# Patient Record
Sex: Female | Born: 1965
Health system: Southern US, Community
[De-identification: ages and names within clinical notes are randomized; demographics above are authoritative.]

## PROBLEM LIST (undated history)

## (undated) ENCOUNTER — Ambulatory Visit (HOSPITAL_COMMUNITY): Admission: EM | Payer: BC Managed Care – PPO

## (undated) DIAGNOSIS — M069 Rheumatoid arthritis, unspecified: Secondary | ICD-10-CM

## (undated) DIAGNOSIS — F419 Anxiety disorder, unspecified: Secondary | ICD-10-CM

## (undated) DIAGNOSIS — I10 Essential (primary) hypertension: Secondary | ICD-10-CM

## (undated) DIAGNOSIS — T7840XA Allergy, unspecified, initial encounter: Secondary | ICD-10-CM

## (undated) DIAGNOSIS — K589 Irritable bowel syndrome without diarrhea: Secondary | ICD-10-CM

## (undated) DIAGNOSIS — R12 Heartburn: Secondary | ICD-10-CM

## (undated) DIAGNOSIS — K219 Gastro-esophageal reflux disease without esophagitis: Secondary | ICD-10-CM

## (undated) DIAGNOSIS — D649 Anemia, unspecified: Secondary | ICD-10-CM

## (undated) DIAGNOSIS — G709 Myoneural disorder, unspecified: Secondary | ICD-10-CM

## (undated) HISTORY — DX: Allergy, unspecified, initial encounter: T78.40XA

## (undated) HISTORY — DX: Anxiety disorder, unspecified: F41.9

## (undated) HISTORY — PX: ABDOMINAL HYSTERECTOMY: SHX81

## (undated) HISTORY — DX: Myoneural disorder, unspecified: G70.9

## (undated) HISTORY — DX: Gastro-esophageal reflux disease without esophagitis: K21.9

## (undated) HISTORY — PX: OTHER SURGICAL HISTORY: SHX169

## (undated) HISTORY — DX: Anemia, unspecified: D64.9

## (undated) HISTORY — DX: Irritable bowel syndrome, unspecified: K58.9

---

## 1998-05-28 ENCOUNTER — Other Ambulatory Visit: Admission: RE | Admit: 1998-05-28 | Discharge: 1998-05-28 | Payer: Self-pay | Admitting: Obstetrics & Gynecology

## 1999-09-19 ENCOUNTER — Other Ambulatory Visit: Admission: RE | Admit: 1999-09-19 | Discharge: 1999-09-19 | Payer: Self-pay | Admitting: Obstetrics & Gynecology

## 2000-10-08 ENCOUNTER — Other Ambulatory Visit: Admission: RE | Admit: 2000-10-08 | Discharge: 2000-10-08 | Payer: Self-pay | Admitting: Obstetrics & Gynecology

## 2001-11-11 ENCOUNTER — Other Ambulatory Visit: Admission: RE | Admit: 2001-11-11 | Discharge: 2001-11-11 | Payer: Self-pay | Admitting: Obstetrics & Gynecology

## 2003-01-18 ENCOUNTER — Other Ambulatory Visit: Admission: RE | Admit: 2003-01-18 | Discharge: 2003-01-18 | Payer: Self-pay | Admitting: Obstetrics & Gynecology

## 2004-02-13 ENCOUNTER — Other Ambulatory Visit: Admission: RE | Admit: 2004-02-13 | Discharge: 2004-02-13 | Payer: Self-pay | Admitting: Obstetrics & Gynecology

## 2005-03-31 ENCOUNTER — Other Ambulatory Visit: Admission: RE | Admit: 2005-03-31 | Discharge: 2005-03-31 | Payer: Self-pay | Admitting: Obstetrics & Gynecology

## 2009-09-21 ENCOUNTER — Encounter (INDEPENDENT_AMBULATORY_CARE_PROVIDER_SITE_OTHER): Payer: Self-pay | Admitting: *Deleted

## 2009-10-25 ENCOUNTER — Ambulatory Visit: Payer: Self-pay | Admitting: Internal Medicine

## 2009-10-25 DIAGNOSIS — R141 Gas pain: Secondary | ICD-10-CM | POA: Insufficient documentation

## 2009-10-25 DIAGNOSIS — K589 Irritable bowel syndrome without diarrhea: Secondary | ICD-10-CM | POA: Insufficient documentation

## 2009-10-25 DIAGNOSIS — R142 Eructation: Secondary | ICD-10-CM

## 2009-10-25 DIAGNOSIS — R143 Flatulence: Secondary | ICD-10-CM

## 2009-10-25 LAB — CONVERTED CEMR LAB
Basophils Absolute: 0 10*3/uL (ref 0.0–0.1)
CRP, High Sensitivity: 9.32 — ABNORMAL HIGH (ref 0.00–5.00)
HCT: 35.5 % — ABNORMAL LOW (ref 36.0–46.0)
Lymphs Abs: 2 10*3/uL (ref 0.7–4.0)
MCV: 90.8 fL (ref 78.0–100.0)
Monocytes Absolute: 0.5 10*3/uL (ref 0.1–1.0)
Neutrophils Relative %: 62.7 % (ref 43.0–77.0)
Platelets: 335 10*3/uL (ref 150.0–400.0)
RDW: 12.7 % (ref 11.5–14.6)
TSH: 1.34 microintl units/mL (ref 0.35–5.50)
WBC: 7.1 10*3/uL (ref 4.5–10.5)

## 2009-12-12 ENCOUNTER — Ambulatory Visit: Payer: Self-pay | Admitting: Internal Medicine

## 2010-01-25 ENCOUNTER — Encounter: Payer: Self-pay | Admitting: Internal Medicine

## 2010-07-09 NOTE — Letter (Signed)
Summary: CMA Hemoccult Letter  [facility]  [address] [place], [place] [address]   Phone: [phone]  Fax: [phone]         January 25, 2010 MRN: [id number]    [facility] [address] [place], [place]  [address]    Dear Karla Howell,     At your last visit, Dr.Perry requested that you complete the hemoccult cards given to you at your last visit. However, as of yet, we have not received them. Please follow the instructions on the inside cover and return them as soon as possible.If you have misplaced the hemoccult cards, please call me at [phone] and I will mail you new cards. Your health is very important to [place].These tests will help ensure that Dr. Marina Goodell has all the information at his disposal to make a complete diagnosis for you.  Thank you for your prompt attention to this matter.   Sincerely,    Milford Cage NCMA

## 2010-07-09 NOTE — Assessment & Plan Note (Signed)
Summary: Bloating and gas - 6 week followup   History of Present Illness Visit Type: Follow-up Visit Primary GI Howell: Karla Howell Primary Provider: Annamaria Helling, M.D. Chief Complaint: Pt states she still has gas, bloating after meals.  Pt states she has been taking Gas-x prevention before meals which has really helped the last 2 days.  History of Present Illness:   45 year old female who was evaluated Oct 25, 2009 for postprandial abdominal bloating and gas. CBC revealed mild anemia with a hemoglobin of 11.8. C-Reactive protein was mildly elevated at 9. Tissue transglutaminase antibody was normal as was TSH. Empiric trial of probiotic Align did not help. She did use GlycoLax with improvement in bowel movements and overall sense of relief. She was concerned about using this regularly though had no problems. She did not submitted Hemoccult cards. She states that she continues with gas and bloating after meals. She recently tried a Gas-X type digestive enzyme and thinks this helped some. No new problems.   GI Review of Systems    Reports belching and  bloating.      Denies abdominal pain, acid reflux, chest pain, dysphagia with liquids, dysphagia with solids, heartburn, loss of appetite, nausea, vomiting, vomiting blood, weight loss, and  weight gain.      Reports constipation.     Denies anal fissure, black tarry stools, change in bowel habit, diarrhea, diverticulosis, fecal incontinence, heme positive stool, hemorrhoids, irritable bowel syndrome, jaundice, light color stool, liver problems, rectal bleeding, and  rectal pain.    Current Medications (verified): 1)  Gas-X Prevention  Caps (Alpha-D-Galactosidase) .... One Capsule By Mouth Before Meals  Allergies (verified): No Known Drug Allergies  Past History:  Past Medical History: Reviewed history from 10/25/2009 and no changes required. Unremarkable  Past Surgical History: Reviewed history from 10/25/2009 and no changes  required. Unremarkable  Family History: Reviewed history from 10/25/2009 and no changes required. Family History of Diabetes: GM Family History of Heart Disease: GM Family History of Kidney Disease:GM No FH of Colon Cancer:  Social History: Reviewed history from 10/25/2009 and no changes required. 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  Alcohol Use - yes Daily Caffeine Use Illicit Drug Use - no  Review of Systems  The patient denies allergy/sinus, anemia, anxiety-new, arthritis/joint pain, back pain, blood in urine, breast changes/lumps, change in vision, confusion, cough, coughing up blood, depression-new, fainting, fatigue, fever, headaches-new, hearing problems, heart murmur, heart rhythm changes, itching, menstrual pain, muscle pains/cramps, night sweats, nosebleeds, pregnancy symptoms, shortness of breath, skin rash, sleeping problems, sore throat, swelling of feet/legs, swollen lymph glands, thirst - excessive , urination - excessive , urination changes/pain, urine leakage, vision changes, and voice change.    Vital Signs:  Patient profile:   45 year old female Height:      63.5 inches Weight:      159.25 pounds BMI:     27.87 Pulse rate:   80 / minute Pulse rhythm:   regular BP sitting:   128 / 80  (left arm) Cuff size:   regular  Vitals Entered By: Christie Nottingham CMA Duncan Dull) (December 12, 2009 4:07 PM)  Physical Exam  General:  Well developed, well nourished, no acute distress. Head:  Normocephalic and atraumatic. Eyes:  PERRLA, no icterus. Lungs:  Clear throughout to auscultation. Heart:  Regular rate and rhythm; no murmurs, rubs,  or bruits. Abdomen:  Soft, nontender and nondistended. No masses, hepatosplenomegaly or hernias noted. Normal bowel sounds. Pulses:  Normal pulses noted. Extremities:  no edema Neurologic:  Alert and  oriented x4;   Psych:  Alert and cooperative. Normal mood and affect.   Impression & Recommendations:  Problem # 1:  IBS  (ICD-564.1) problems with postprandial bloating and chronic constipation likely due to irritable bowel syndrome. We discussed this today.  Plan: #1. Okay to use MiraLax more liberally #2. Discussed participating in research study currently ongoing for constipation IBS. She will consider  Problem # 2:  SCREENING COLORECTAL-CANCER (ICD-V76.51) we discussed colon cancer screening. She is an appropriate candidate. Appropriate age given race.  Plan: #1. The nature of colonoscopy as well as the risks, benefits, and alternatives were reviewed in detail. She understood.[name] Kitchen She wants to check with her insurance carrier regarding potential costs. If she decides to proceed with colonoscopy at this time, she will contact the office and see a pre-visit nurse. #2. If the patient is not proceed with colonoscopy, then submit Hemoccult cards.  Problem # 3:  FLATULENCE-GAS-BLOATING (ICD-787.3) please see #1 above  Patient Instructions: 1)  Adjust Miralax as needed. 2)  Call when you are ready to schedule your colonoscopy procedure. 3)  Please schedule a follow-up appointment as needed.  4)  The medication list was reviewed and reconciled.  All changed / newly prescribed medications were explained.  A complete medication list was provided to the patient / caregiver. 5)  Copy: Dr. Annamaria Helling

## 2010-07-09 NOTE — Assessment & Plan Note (Signed)
Summary: bloating/gas--   History of Present Illness Visit Type: Initial Visit Primary GI MD: Yancey Flemings MD Primary Provider: Annamaria Helling, M.D. Chief Complaint: Bloating after meals,  gas History of Present Illness:   45 year old [place] American female with no significant medical history. She presents for self today with chief complaint of abdominal bloating. She reports at least a 5 year history of stable but persistent postprandial abdominal bloating and swelling sensation. No true pain. Problem occurs most days and is worse with constipation. She states that she moves her bowels spontaneously about every 3-4 days. After a bowel movement, either spontaneously or assisted, she feels somewhat better. She uses prune juice regularly. She describes her stools as pebble-like. No bleeding. No weight loss. No family history of colon cancer. No prior history of GI problems or GI evaluations. She has not noticed any particular foods that her most problematic. She tried multiple anti-gas agents, OTC, without relief. No nocturnal symptoms. She does mention borborygmi at night. Her gynecologist is Dr. Aldona Bar.   GI Review of Systems    Reports belching and  bloating.      Denies abdominal pain, acid reflux, chest pain, dysphagia with liquids, dysphagia with solids, heartburn, loss of appetite, nausea, vomiting, vomiting blood, weight loss, and  weight gain.      Reports constipation.     Denies anal fissure, black tarry stools, change in bowel habit, diarrhea, diverticulosis, fecal incontinence, heme positive stool, hemorrhoids, irritable bowel syndrome, jaundice, light color stool, liver problems, rectal bleeding, and  rectal pain. Preventive Screening-Counseling & Management  Alcohol-Tobacco     Smoking Status: never      Drug Use:  no.      Current Medications (verified): 1)  Seasonique 0.15-0.03 &0.01 Mg Tabs (Levonorgest-Eth Estrad 91-Day) .... 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  Alcohol Use - yes Daily Caffeine Use Illicit Drug Use - no Smoking Status:  never Drug Use:  no  Review of Systems       The patient complains of menstrual pain and swelling of feet/legs.  The patient denies allergy/sinus, anemia, anxiety-new, arthritis/joint pain, back pain, blood in urine, breast changes/lumps, change in vision, confusion, cough, coughing up blood, depression-new, fainting, fatigue, fever, headaches-new, hearing problems, heart murmur, heart rhythm changes, itching, muscle pains/cramps, night sweats, nosebleeds, pregnancy symptoms, shortness of breath, skin rash, sleeping problems, sore throat, swollen lymph glands, thirst - excessive , urination - excessive , urination changes/pain, urine leakage, vision changes, and voice change.    Vital Signs:  Patient profile:   45 year old female Height:      63.5 inches Weight:      159.38 pounds BMI:     27.89 Pulse rate:   80 / minute Pulse rhythm:   regular BP sitting:   130 / 88  (left arm) Cuff size:   regular  Vitals Entered By: June McMurray CMA Duncan Dull) (Oct 25, 2009 11:16 AM)  Physical Exam  General:  Well developed, well nourished, no acute distress. Head:  Normocephalic and atraumatic. Eyes:  PERRLA, no icterus. Ears:  Normal auditory acuity. Nose:  No deformity, discharge,  or lesions. Mouth:  No deformity or lesions, dentition normal. Neck:  Supple; no  masses or thyromegaly. Lungs:  Clear throughout to auscultation. Heart:  Regular rate and rhythm; no murmurs, rubs,  or bruits. Abdomen:  Soft, nontender and nondistended. No masses, hepatosplenomegaly or hernias noted. Normal bowel  sounds. Rectal:  deferred Msk:  Symmetrical with no gross deformities. Normal posture. Pulses:  Normal pulses noted. Extremities:  No clubbing, cyanosis, edema or deformities noted. Neurologic:  Alert and  oriented x4;   Skin:  Intact without significant lesions or rashes. Psych:  Alert and cooperative. Normal mood and affect.   Impression & Recommendations:  Problem # 1:  IBS (ICD-564.1) chronic problems with postprandial bloating associated with relative constipation. No alarm features. The history is most compatible with constipation predominant irritable bowel syndrome. I discussed this with the patient.  Plan: #1. Screening laboratories including CBC, C-reactive protein, tissue transglutaminase antibody, and thyroid stimulating hormone #2. Screening Hemoccult cards #3. Empiric trial of the probiotic align. One daily for 2 weeks. Samples given #4. GlycoLax 17 g in 8 ounces of water daily. Titrated need to achieve at least one daily bowel movement #5. Routine office followup in 6 weeks  Problem # 2:  FLATULENCE-GAS-BLOATING (ICD-787.3) as above, likely due to IBS. Workup and plan as above.  Problem # 3:  SCREENING COLORECTAL-CANCER (ICD-V76.51) screening colonoscopy at age 72 for African Americans. She turns 44 shortly. I discussed this briefly today. We will discuss this further at the time of her followup.  Other Orders: TLB-CBC Platelet - w/Differential (85025-CBCD) TLB-CRP-High Sensitivity (C-Reactive Protein) (86140-FCRP) TLB-TSH (Thyroid Stimulating Hormone) (84443-TSH) T-Tissue Transglutamase Ab IgA ([address]) [facility] Hemoccult Cards #3 (take home) (Hem cards #3)  Patient Instructions: 1)  Hemoccult cards given to patient to return in 2 weeks. 2)  Instructions given with the cards. 3)  Labs ordered for patient to have drawn today. 4)  Miralax instructions given to patient. 5)  Align samples given to patient to take 1 by mouth once daily x 2 weeks. 6)  The  medication list was reviewed and reconciled.  All changed / newly prescribed medications were explained.  A complete medication list was provided to the patient / caregiver. 7)  printed and given to patient. Milford Cage [facility]  Oct 25, 2009 12:09 PM 8)  Copy: Dr. Annamaria Helling

## 2010-07-09 NOTE — Letter (Signed)
Summary: New Patient letter  [facility]  [address] [place], [place] [address]   Phone: [phone]  Fax: [phone]       09/21/2009 MRN: [id number]  [facility] [address] [place], [place]  [address]  Dear Ms. Lanigan,  Welcome to the Gastroenterology Division at [facility].    You are scheduled to see Dr.  Marina Goodell on 10-25-09 at 11:00a.m. on the 3rd floor at [facility], [address].  We ask that you try to arrive at our office 15 minutes prior to your appointment time to allow for check-in.  We would like you to complete the enclosed self-administered evaluation form prior to your visit and bring it with you on the day of your appointment.  We will review it with you.  Also, please bring a complete list of all your medications or, if you prefer, bring the medication bottles and we will list them.  Please bring your insurance card so that we may make a copy of it.  If your insurance requires a referral to see a specialist, please bring your referral form from your primary care physician.  Co-payments are due at the time of your visit and may be paid by cash, check or credit card.     Your office visit will consist of a consult with your physician (includes a physical exam), any laboratory testing he/she may order, scheduling of any necessary diagnostic testing (e.g. x-ray, ultrasound, CT-scan), and scheduling of a procedure (e.g. Endoscopy, Colonoscopy) if required.  Please allow enough time on your schedule to allow for any/all of these possibilities.    If you cannot keep your appointment, please call [phone] to cancel or reschedule prior to your appointment date.  This allows [place] the opportunity to schedule an appointment for another patient in need of care.  If you do not cancel or reschedule by 5 p.m. the business day prior to your appointment date, you will be charged a $50.00 late cancellation/no-show fee.    Thank you for  choosing [facility] for your medical needs.  We appreciate the opportunity to care for you.  Please visit [place] at our website  to learn more about our practice.                     Sincerely,                                                             The Gastroenterology Division

## 2010-09-25 ENCOUNTER — Encounter: Payer: Self-pay | Admitting: *Deleted

## 2010-10-04 ENCOUNTER — Ambulatory Visit (INDEPENDENT_AMBULATORY_CARE_PROVIDER_SITE_OTHER): Payer: 59 | Admitting: Internal Medicine

## 2010-10-04 ENCOUNTER — Encounter: Payer: Self-pay | Admitting: Internal Medicine

## 2010-10-04 DIAGNOSIS — K589 Irritable bowel syndrome without diarrhea: Secondary | ICD-10-CM

## 2010-10-04 DIAGNOSIS — G4700 Insomnia, unspecified: Secondary | ICD-10-CM

## 2010-10-04 DIAGNOSIS — Z0000 Encounter for general adult medical examination without abnormal findings: Secondary | ICD-10-CM

## 2010-10-04 MED ORDER — MIRTAZAPINE 15 MG PO TABS: 15.0000 mg | ORAL_TABLET | Freq: Every day | ORAL | Status: DC

## 2010-10-04 MED ORDER — ASPIRIN EC 81 MG PO TBEC: 81.0000 mg | DELAYED_RELEASE_TABLET | Freq: Every day | ORAL | Status: AC

## 2010-10-04 NOTE — Progress Notes (Signed)
  Subjective:    Patient ID: Karla Howell, female    DOB: September 25, 1965, 45 y.o.   MRN: [id number]  HPI    Review of Systems     Objective:   Physical Exam        Assessment & Plan:

## 2010-12-13 ENCOUNTER — Other Ambulatory Visit (INDEPENDENT_AMBULATORY_CARE_PROVIDER_SITE_OTHER): Payer: 59

## 2010-12-13 DIAGNOSIS — Z0000 Encounter for general adult medical examination without abnormal findings: Secondary | ICD-10-CM

## 2010-12-13 LAB — CBC WITH DIFFERENTIAL/PLATELET
Basophils Absolute: 0 10*3/uL (ref 0.0–0.1)
Eosinophils Absolute: 0.2 10*3/uL (ref 0.0–0.7)
Eosinophils Relative: 3 % (ref 0.0–5.0)
HCT: 35.3 % — ABNORMAL LOW (ref 36.0–46.0)
Lymphs Abs: 1.9 10*3/uL (ref 0.7–4.0)
MCHC: 33.3 g/dL (ref 30.0–36.0)
MCV: 91.4 fl (ref 78.0–100.0)
Monocytes Absolute: 0.6 10*3/uL (ref 0.1–1.0)
Platelets: 306 10*3/uL (ref 150.0–400.0)
RDW: 13.3 % (ref 11.5–14.6)

## 2010-12-13 LAB — HEPATIC FUNCTION PANEL
Albumin: 3.8 g/dL (ref 3.5–5.2)
Total Bilirubin: 0.5 mg/dL (ref 0.3–1.2)

## 2010-12-13 LAB — LIPID PANEL
Cholesterol: 151 mg/dL (ref 0–200)
HDL: 51.7 mg/dL (ref >39.00)
LDL Cholesterol: 74 mg/dL (ref 0–99)
VLDL: 25.8 mg/dL (ref 0.0–40.0)

## 2010-12-13 LAB — POCT URINALYSIS DIPSTICK
Ketones, UA: NEGATIVE
Leukocytes, UA: NEGATIVE
Protein, UA: NEGATIVE
Urobilinogen, UA: 0.2
pH, UA: 5.5

## 2010-12-13 LAB — BASIC METABOLIC PANEL
BUN: 12 mg/dL (ref 6–23)
Calcium: 8.9 mg/dL (ref 8.4–10.5)
GFR: 59.96 mL/min — ABNORMAL LOW (ref >60.00)
Glucose, Bld: 84 mg/dL (ref 70–99)
Sodium: 140 mEq/L (ref 135–145)

## 2010-12-18 ENCOUNTER — Encounter: Payer: Self-pay | Admitting: Internal Medicine

## 2010-12-20 ENCOUNTER — Ambulatory Visit (INDEPENDENT_AMBULATORY_CARE_PROVIDER_SITE_OTHER): Payer: 59 | Admitting: Internal Medicine

## 2010-12-20 VITALS — BP 130/80 | HR 72 | Temp 98.6°F | Resp 14 | Ht 63.5 in | Wt 164.0 lb

## 2010-12-20 DIAGNOSIS — Z136 Encounter for screening for cardiovascular disorders: Secondary | ICD-10-CM

## 2010-12-20 DIAGNOSIS — R141 Gas pain: Secondary | ICD-10-CM

## 2010-12-20 DIAGNOSIS — R142 Eructation: Secondary | ICD-10-CM

## 2010-12-20 DIAGNOSIS — K589 Irritable bowel syndrome without diarrhea: Secondary | ICD-10-CM

## 2010-12-20 DIAGNOSIS — Z0000 Encounter for general adult medical examination without abnormal findings: Secondary | ICD-10-CM | POA: Insufficient documentation

## 2010-12-20 MED ORDER — HYOSCYAMINE SULFATE 0.125 MG SL SUBL: 0.1250 mg | SUBLINGUAL_TABLET | SUBLINGUAL | Status: DC | PRN

## 2010-12-20 MED ORDER — HYOSCYAMINE SULFATE 0.125 MG SL SUBL: 0.1250 mg | SUBLINGUAL_TABLET | SUBLINGUAL | Status: AC | PRN

## 2010-12-20 NOTE — Progress Notes (Signed)
  Subjective:    Patient ID: Karla Howell, female    DOB: 30-Mar-1966, 45 y.o.   MRN: [id number]  HPI cpx   Review of Systems  Constitutional: Negative for activity change, appetite change and fatigue.  HENT: Negative for ear pain, congestion, neck pain, postnasal drip and sinus pressure.   Eyes: Negative for redness and visual disturbance.  Respiratory: Negative for cough, shortness of breath and wheezing.   Gastrointestinal: Negative for abdominal pain and abdominal distention.  Genitourinary: Negative for dysuria, frequency and menstrual problem.  Musculoskeletal: Negative for myalgias, joint swelling and arthralgias.  Skin: Negative for rash and wound.  Neurological: Negative for dizziness, weakness and headaches.  Hematological: Negative for adenopathy. Does not bruise/bleed easily.  Psychiatric/Behavioral: Negative for sleep disturbance and decreased concentration.   Past Medical History  Diagnosis Date  . Hemorrhoids   . Anemia   . Allergy   . IBS (irritable bowel syndrome)    No past surgical history on file.  reports that she has never smoked. She does not have any smokeless tobacco history on file. She reports that she drinks alcohol. She reports that she does not use illicit drugs. family history includes Hyperlipidemia in her mother and Hypertension in her mother. No Known Allergies     Objective:   Physical Exam  Constitutional: She is oriented to person, place, and time. She appears well-developed and well-nourished. No distress.  HENT:  Head: Normocephalic and atraumatic.  Right Ear: External ear normal.  Left Ear: External ear normal.  Nose: Nose normal.  Mouth/Throat: Oropharynx is clear and moist.  Eyes: Conjunctivae and EOM are normal. Pupils are equal, round, and reactive to light.  Neck: Normal range of motion. Neck supple. No JVD present. No tracheal deviation present. No thyromegaly present.  Cardiovascular: Normal rate, regular rhythm, normal  heart sounds and intact distal pulses.   No murmur heard. Pulmonary/Chest: Effort normal and breath sounds normal. She has no wheezes. She exhibits no tenderness.  Abdominal: Soft. Bowel sounds are normal.  Musculoskeletal: Normal range of motion. She exhibits no edema and no tenderness.  Lymphadenopathy:    She has no cervical adenopathy.  Neurological: She is alert and oriented to person, place, and time. She has normal reflexes. No cranial nerve deficit.  Skin: Skin is warm and dry. She is not diaphoretic.  Psychiatric: She has a normal mood and affect. Her behavior is normal.          Assessment & Plan:  . This is a routine physical examination for this healthy  Female. Reviewed all health maintenance protocols including mammography colonoscopy bone density and reviewed appropriate screening labs. Her immunization history was reviewed as well as her current medications and allergies refills of her chronic medications were given and the plan for yearly health maintenance was discussed all orders and referrals were made as appropriate.

## 2011-01-10 ENCOUNTER — Encounter: Payer: Self-pay | Admitting: Internal Medicine

## 2011-03-21 ENCOUNTER — Ambulatory Visit: Payer: 59 | Admitting: Internal Medicine

## 2011-05-24 ENCOUNTER — Encounter: Payer: Self-pay | Admitting: Family Medicine

## 2011-05-24 ENCOUNTER — Ambulatory Visit (INDEPENDENT_AMBULATORY_CARE_PROVIDER_SITE_OTHER): Payer: 59 | Admitting: Family Medicine

## 2011-05-24 VITALS — BP 142/92 | HR 89 | Temp 98.7°F | Ht 63.5 in | Wt 164.0 lb

## 2011-05-24 DIAGNOSIS — J329 Chronic sinusitis, unspecified: Secondary | ICD-10-CM | POA: Insufficient documentation

## 2011-05-24 MED ORDER — CLARITHROMYCIN ER 500 MG PO TB24: 1000.0000 mg | ORAL_TABLET | Freq: Every day | ORAL | Status: AC

## 2011-05-24 NOTE — Assessment & Plan Note (Addendum)
Pt's sxs and PE consistent w/ infxn.  Start abx.  Reviewed supportive care and red flags that should prompt return.  Pt expressed understanding and is in agreement w/ plan.  

## 2011-05-24 NOTE — Progress Notes (Signed)
  Subjective:    Patient ID: Karla Howell, female    DOB: 09/15/1965, 45 y.o.   MRN: [id number]  HPI URI- sxs started earlier this week.  Thought she was improving but then again felt bad 2 days ago. + nasal congestion, cough, sneezing, HA, facial pain.  No ear pain, fever, tooth pain.  + bloody nasal drainage.  No known sick contact.   Review of Systems For ROS see HPI     Objective:   Physical Exam  Vitals reviewed. Constitutional: She appears well-developed and well-nourished. No distress.  HENT:  Head: Normocephalic and atraumatic.  Right Ear: Tympanic membrane normal.  Left Ear: Tympanic membrane normal.  Nose: Mucosal edema and rhinorrhea present. Right sinus exhibits maxillary sinus tenderness and frontal sinus tenderness. Left sinus exhibits maxillary sinus tenderness and frontal sinus tenderness.  Mouth/Throat: Uvula is midline and mucous membranes are normal. Posterior oropharyngeal erythema present. No oropharyngeal exudate.  Eyes: Conjunctivae and EOM are normal. Pupils are equal, round, and reactive to light.  Neck: Normal range of motion. Neck supple.  Cardiovascular: Normal rate, regular rhythm and normal heart sounds.   Pulmonary/Chest: Effort normal and breath sounds normal. No respiratory distress. She has no wheezes.  Lymphadenopathy:    She has no cervical adenopathy.          Assessment & Plan:

## 2011-05-24 NOTE — Patient Instructions (Signed)
This is a sinus infection Take the Biaxin- 2 tabs at the same time- w/ food Mucinex to thin your congestion REST! Drink plenty of fluids Call with any questions or concerns Hang in there! Happy Holidays!

## 2012-01-14 ENCOUNTER — Other Ambulatory Visit (INDEPENDENT_AMBULATORY_CARE_PROVIDER_SITE_OTHER): Payer: BC Managed Care – PPO

## 2012-01-14 DIAGNOSIS — Z0000 Encounter for general adult medical examination without abnormal findings: Secondary | ICD-10-CM

## 2012-01-14 LAB — LIPID PANEL
Cholesterol: 137 mg/dL (ref 0–200)
LDL Cholesterol: 70 mg/dL (ref 0–99)
Triglycerides: 122 mg/dL (ref 0.0–149.0)
VLDL: 24.4 mg/dL (ref 0.0–40.0)

## 2012-01-14 LAB — CBC WITH DIFFERENTIAL/PLATELET
Basophils Absolute: 0 10*3/uL (ref 0.0–0.1)
HCT: 36.7 % (ref 36.0–46.0)
Lymphs Abs: 0.9 10*3/uL (ref 0.7–4.0)
Monocytes Relative: 10.6 % (ref 3.0–12.0)
Neutrophils Relative %: 65 % (ref 43.0–77.0)
Platelets: 296 10*3/uL (ref 150.0–400.0)
RDW: 12.7 % (ref 11.5–14.6)

## 2012-01-14 LAB — BASIC METABOLIC PANEL
BUN: 10 mg/dL (ref 6–23)
Calcium: 8.8 mg/dL (ref 8.4–10.5)
Creatinine, Ser: 0.9 mg/dL (ref 0.4–1.2)
GFR: 71.29 mL/min (ref >60.00)
Glucose, Bld: 76 mg/dL (ref 70–99)
Potassium: 4.1 mEq/L (ref 3.5–5.1)

## 2012-01-14 LAB — POCT URINALYSIS DIPSTICK
Blood, UA: NEGATIVE
Protein, UA: NEGATIVE
Spec Grav, UA: 1.02
Urobilinogen, UA: 0.2

## 2012-01-14 LAB — HEPATIC FUNCTION PANEL
AST: 17 U/L (ref 0–37)
Total Bilirubin: 0.8 mg/dL (ref 0.3–1.2)

## 2012-01-21 ENCOUNTER — Ambulatory Visit (INDEPENDENT_AMBULATORY_CARE_PROVIDER_SITE_OTHER): Payer: BC Managed Care – PPO | Admitting: Internal Medicine

## 2012-01-21 ENCOUNTER — Encounter: Payer: Self-pay | Admitting: Internal Medicine

## 2012-01-21 VITALS — BP 140/80 | HR 72 | Temp 98.0°F | Resp 16 | Ht 63.5 in | Wt 160.0 lb

## 2012-01-21 DIAGNOSIS — D649 Anemia, unspecified: Secondary | ICD-10-CM

## 2012-01-21 DIAGNOSIS — G4700 Insomnia, unspecified: Secondary | ICD-10-CM

## 2012-01-21 DIAGNOSIS — R5383 Other fatigue: Secondary | ICD-10-CM

## 2012-01-21 DIAGNOSIS — Z0000 Encounter for general adult medical examination without abnormal findings: Secondary | ICD-10-CM

## 2012-01-21 MED ORDER — FERROUS GLUCONATE 325 (37.5 FE) MG PO TABS: 1.0000 | ORAL_TABLET | Freq: Every day | ORAL | Status: DC

## 2012-01-21 MED ORDER — ZOLPIDEM TARTRATE 5 MG PO TABS
5.0000 mg | ORAL_TABLET | Freq: Every evening | ORAL | Status: DC | PRN
Start: 2012-01-21 — End: 2013-02-01

## 2012-01-21 NOTE — Progress Notes (Signed)
Subjective:    Patient ID: Karla Howell, female    DOB: 04-30-1966, 46 y.o.   MRN: [id number]  HPI CPX   Review of Systems  Constitutional: Negative for activity change, appetite change and fatigue.  HENT: Negative for ear pain, congestion, neck pain, postnasal drip and sinus pressure.   Eyes: Negative for redness and visual disturbance.  Respiratory: Negative for cough, shortness of breath and wheezing.   Gastrointestinal: Negative for abdominal pain and abdominal distention.  Genitourinary: Negative for dysuria, frequency and menstrual problem.  Musculoskeletal: Negative for myalgias, joint swelling and arthralgias.  Skin: Negative for rash and wound.  Neurological: Negative for dizziness, weakness and headaches.  Hematological: Negative for adenopathy. Does not bruise/bleed easily.  Psychiatric/Behavioral: Negative for disturbed wake/sleep cycle and decreased concentration.   Past Medical History  Diagnosis Date  . Hemorrhoids   . Anemia   . Allergy   . IBS (irritable bowel syndrome)     History   Social History  . Marital Status: Single    Spouse Name: N/A    Number of Children: N/A  . Years of Education: N/A   Occupational History  . insurance    Social History Main Topics  . Smoking status: Never Smoker   . Smokeless tobacco: Not on file  . Alcohol Use: Yes  . Drug Use: No  . Sexually Active: Yes   Other Topics Concern  . Not on file   Social History Narrative  . No narrative on file    No past surgical history on file.  Family History  Problem Relation Age of Onset  . Hypertension Mother   . Hyperlipidemia Mother     No Known Allergies  Current Outpatient Prescriptions on File Prior to Visit  Medication Sig Dispense Refill  . Ferrous Gluconate 325 (37.5 FE) MG TABS Take 1 tablet by mouth daily.  30 tablet    . zolpidem (AMBIEN) 5 MG tablet Take 1 tablet (5 mg total) by mouth at bedtime as needed for sleep.  30 tablet  3    BP 140/80   Pulse 72  Temp 98 F (36.7 C)  Resp 16  Ht 5' 3.5" (1.613 m)  Wt 160 lb (72.576 kg)  BMI 27.90 kg/m2       Objective:   Physical Exam  Constitutional: She is oriented to person, place, and time. She appears well-developed and well-nourished. No distress.  HENT:  Head: Normocephalic and atraumatic.  Right Ear: External ear normal.  Left Ear: External ear normal.  Nose: Nose normal.  Mouth/Throat: Oropharynx is clear and moist.  Eyes: Conjunctivae and EOM are normal. Pupils are equal, round, and reactive to light.  Neck: Normal range of motion. Neck supple. No JVD present. No tracheal deviation present. No thyromegaly present.  Cardiovascular: Normal rate, regular rhythm, normal heart sounds and intact distal pulses.   No murmur heard. Pulmonary/Chest: Effort normal and breath sounds normal. She has no wheezes. She exhibits no tenderness.  Abdominal: Soft. Bowel sounds are normal.  Musculoskeletal: Normal range of motion. She exhibits no edema and no tenderness.  Lymphadenopathy:    She has no cervical adenopathy.  Neurological: She is alert and oriented to person, place, and time. She has normal reflexes. No cranial nerve deficit.  Skin: Skin is warm and dry. She is not diaphoretic.  Psychiatric: She has a normal mood and affect. Her behavior is normal.          Assessment & Plan:   Patient presents for  yearly preventative medicine examination.   all immunizations and health maintenance protocols were reviewed with the patient and they are up to date with these protocols.   screening laboratory values were reviewed with the patient including screening of hyperlipidemia PSA renal function and hepatic function.   There medications past medical history social history problem list and allergies were reviewed in detail.   Goals were established with regard to weight loss exercise diet in compliance with medications

## 2012-01-21 NOTE — Patient Instructions (Addendum)
The patient is instructed to continue all medications as prescribed. Schedule followup with check out clerk upon leaving the clinic  

## 2012-02-04 ENCOUNTER — Ambulatory Visit (INDEPENDENT_AMBULATORY_CARE_PROVIDER_SITE_OTHER): Payer: BC Managed Care – PPO | Admitting: Family Medicine

## 2012-02-04 VITALS — BP 112/73 | HR 90 | Temp 98.9°F | Resp 17 | Ht 64.5 in | Wt 159.0 lb

## 2012-02-04 DIAGNOSIS — H6120 Impacted cerumen, unspecified ear: Secondary | ICD-10-CM

## 2012-02-04 DIAGNOSIS — J019 Acute sinusitis, unspecified: Secondary | ICD-10-CM

## 2012-02-04 DIAGNOSIS — J329 Chronic sinusitis, unspecified: Secondary | ICD-10-CM

## 2012-02-04 MED ORDER — AMOXICILLIN 875 MG PO TABS: 875.0000 mg | ORAL_TABLET | Freq: Two times a day (BID) | ORAL | Status: AC

## 2012-02-04 NOTE — Progress Notes (Signed)
@[email or web address] @   Patient ID: Karla Howell MRN: [id number], DOB: [birth year], 46 y.o. Date of Encounter: 02/04/2012, 10:35 AM  Primary Physician: Carrie Mew, MD  Chief Complaint:  Chief Complaint  Patient presents with  . Sinus Problem    headache, stuffy, nose, sinus pressure, 2 days     HPI: 46 y.o. year old female presents with 3 day history of nasal congestion, post nasal drip, sore throat, sinus pressure, and cough. Afebrile. No chills. Nasal congestion thick and green/yellow. Sinus pressure is the worst symptom. Cough is productive secondary to post nasal drip and not associated with time of day. Ears feel full, leading to sensation of muffled hearing. Has tried OTC cold preps without success. No GI complaints. Appetite decreased Hearing decreased on left  No recent antibiotics, recent travels, or sick contacts   No leg trauma, sedentary periods, h/o cancer, or tobacco use.  Past Medical History  Diagnosis Date  . Hemorrhoids   . Anemia   . Allergy   . IBS (irritable bowel syndrome)      Home Meds: Prior to Admission medications   Medication Sig Start Date End Date Taking? Authorizing Provider  Ascorbic Acid (VITAMIN C) 100 MG tablet Take 100 mg by mouth daily.   Yes Historical Provider, MD  B Complex-C (B-COMPLEX WITH VITAMIN C) tablet Take 1 tablet by mouth daily.   Yes Historical Provider, MD  Ferrous Gluconate 325 (37.5 FE) MG TABS Take 1 tablet by mouth daily. 01/21/12  Yes Stacie Glaze, MD  hydrocodone-acetaminophen (LORCET-HD) 5-500 MG per capsule Take 1 capsule by mouth every 6 (six) hours as needed.   Yes Historical Provider, MD  zolpidem (AMBIEN) 5 MG tablet Take 1 tablet (5 mg total) by mouth at bedtime as needed for sleep. 01/21/12 01/20/13 Yes Stacie Glaze, MD    Allergies: No Known Allergies  History   Social History  . Marital Status: Single    Spouse Name: N/A    Number of Children: N/A  . Years of Education: N/A   Occupational History    . insurance    Social History Main Topics  . Smoking status: Never Smoker   . Smokeless tobacco: Not on file  . Alcohol Use: Yes  . Drug Use: No  . Sexually Active: Yes   Other Topics Concern  . Not on file   Social History Narrative  . No narrative on file     Review of Systems: Constitutional: negative for chills, fever, night sweats or weight changes Cardiovascular: negative for chest pain or palpitations Respiratory: negative for hemoptysis, wheezing, or shortness of breath Abdominal: negative for abdominal pain, nausea, vomiting or diarrhea Dermatological: negative for rash Neurologic: negative for headache   Physical Exam: Blood pressure 112/73, pulse 90, temperature 98.9 F (37.2 C), temperature source Oral, resp. rate 17, height 5' 4.5" (1.638 m), weight 159 lb (72.122 kg), last menstrual period 01/21/2012, SpO2 97.00%., Body mass index is 26.87 kg/(m^2). General: Well developed, well nourished, in no acute distress. Head: Normocephalic, atraumatic, eyes without discharge, sclera non-icteric, nares are congested. right auditory canals clear, TM's are without perforation, pearly grey with reflective cone of light bilaterally. Serous effusion bilaterally behind TM's. Maxillary sinus TTP. Oral cavity moist, dentition normal. Posterior pharynx with post nasal drip and mild erythema. No peritonsillar abscess or tonsillar exudate. Neck: Supple. No thyromegaly. Full ROM. No lymphadenopathy. Lungs: Clear bilaterally to auscultation without wheezes, rales, or rhonchi. Breathing is unlabored.  Heart: RRR with S1 S2. No murmurs, rubs,  or gallops appreciated. Msk:  Strength and tone normal for age. Extremities: No clubbing or cyanosis. No edema. Neuro: Alert and oriented X 3. Moves all extremities spontaneously. CNII-XII grossly in tact. Psych:  Responds to questions appropriately with a normal affect.     ASSESSMENT AND PLAN:  46 y.o. year old female with sinusitis - 1.  Sinusitis  amoxicillin (AMOXIL) 875 MG tablet     -Tylenol/Motrin prn -Rest/fluids -RTC precautions -RTC 3-5 days if no improvement  Signed, Elvina Sidle, MD 02/04/2012 10:35 AM   Also be left ear showed significant cerumen impaction and this was lavaged clear by my assistant. She noticed significant pulmonary hearing

## 2012-05-09 ENCOUNTER — Ambulatory Visit (INDEPENDENT_AMBULATORY_CARE_PROVIDER_SITE_OTHER): Payer: BC Managed Care – PPO | Admitting: Family Medicine

## 2012-05-09 VITALS — BP 122/82 | HR 87 | Temp 98.5°F | Resp 16 | Ht 64.0 in | Wt 160.0 lb

## 2012-05-09 DIAGNOSIS — L282 Other prurigo: Secondary | ICD-10-CM

## 2012-05-09 DIAGNOSIS — L272 Dermatitis due to ingested food: Secondary | ICD-10-CM

## 2012-05-09 MED ORDER — METHYLPREDNISOLONE ACETATE 80 MG/ML IJ SUSP
40.0000 mg | Freq: Once | INTRAMUSCULAR | Status: AC
  Administered 2012-05-09: 40 mg via INTRAMUSCULAR

## 2012-05-09 NOTE — Progress Notes (Signed)
[facility]:  Office Visit  Chief Complaint:  Chief Complaint  Patient presents with  . Rash    all over, itching, food related, no SOB    HPI: Karla Howell is a 46 y.o. female who complains of rash on her body, started after eating at [employer] . She has had this before after eating at [employer]. She does not have any problems eating at any other restaurant or eating other  foods, she eats at [employer], [employer], eats shrimp without any problems. She denies any new meds, travels, clothes, detergents, working outdoors. Denies SOB, difficulty swallowing, CP, palpitations, nausea, vomiting, abd pain.   Past Medical History  Diagnosis Date  . Hemorrhoids   . Anemia   . Allergy   . IBS (irritable bowel syndrome)    History reviewed. No pertinent past surgical history. History   Social History  . Marital Status: Single    Spouse Name: N/A    Number of Children: N/A  . Years of Education: N/A   Occupational History  . insurance    Social History Main Topics  . Smoking status: Never Smoker   . Smokeless tobacco: None  . Alcohol Use: Yes  . Drug Use: No  . Sexually Active: Yes    Birth Control/ Protection: None   Other Topics Concern  . None   Social History Narrative  . None   Family History  Problem Relation Age of Onset  . Hypertension Mother   . Hyperlipidemia Mother   . Hypertension Sister   . Heart disease Maternal Grandmother   . Diabetes Maternal Grandmother   . Heart disease Maternal Grandfather    No Known Allergies Prior to Admission medications   Medication Sig Start Date End Date Taking? Authorizing Provider  Ascorbic Acid (VITAMIN C) 100 MG tablet Take 100 mg by mouth daily.   Yes Historical Provider, MD  B Complex-C (B-COMPLEX WITH VITAMIN C) tablet Take 1 tablet by mouth daily.   Yes Historical Provider, MD  Ferrous Gluconate 325 (37.5 FE) MG TABS Take 1 tablet by mouth daily. 01/21/12  Yes Stacie Glaze, MD  HYDROcodone-acetaminophen (VICODIN) 5-500 MG per tablet Take 1 tablet by mouth every 6 (six) hours as needed.   Yes Historical Provider, MD  zolpidem (AMBIEN) 5 MG tablet Take 1 tablet (5 mg total) by mouth at bedtime as needed for sleep. 01/21/12 01/20/13 Yes Stacie Glaze, MD  hydrocodone-acetaminophen (LORCET-HD) 5-500 MG per capsule Take 1 capsule by mouth every 6 (six) hours as needed.    Historical Provider, MD     ROS: The patient denies fevers, chills, night sweats, unintentional weight loss, chest pain, palpitations, wheezing, dyspnea on exertion, nausea, vomiting, abdominal pain, dysuria, hematuria, melena, numbness, weakness, or tingling.   All other systems have been reviewed and were otherwise negative with the exception of those mentioned in the HPI and as above.    PHYSICAL EXAM: Filed Vitals:   05/09/12 1425  BP: 122/82  Pulse: 87  Temp: 98.5 F (36.9 C)  Resp: 16   Filed Vitals:   05/09/12 1425  Height: 5\' 4"  (1.626 m)  Weight: 160 lb (72.576 kg)   Body mass index is 27.46 kg/(m^2).  General: Alert, no acute distress HEENT:  Normocephalic, atraumatic, oropharynx patent.  Cardiovascular:  Regular rate and rhythm, no rubs murmurs or gallops.  No Carotid bruits, radial pulse intact. No pedal edema.  Respiratory: Clear to auscultation bilaterally.  No wheezes,  rales, or rhonchi.  No cyanosis, no use of accessory musculature GI: No organomegaly, abdomen is soft and non-tender, positive bowel sounds.  No masses. Skin: + erythematous pruritic rash, diffuse on chest, on arms and legs BL Neurologic: Facial musculature symmetric. Psychiatric: Patient is appropriate throughout our interaction. Lymphatic: No cervical lymphadenopathy Musculoskeletal: Gait intact.   LABS:    EKG/XRAY:   Primary read interpreted by Dr. Conley Rolls at [facility].   ASSESSMENT/PLAN: Encounter Diagnoses  Name Primary?  . Allergic dermatitis due ingested food Yes  . Pruritic rash     Gave Depomedrol 80 mg IM x 1 Medrol dose pack Advise to take antihistamine scheduled until rash is no longer present Patient declined epi pen F/u prn Go to ER prn for SOB/CP     Ahan Eisenberger PHUONG, DO 05/09/2012 2:59 PM

## 2012-12-20 LAB — HM PAP SMEAR: HM PAP: NEGATIVE

## 2013-01-06 ENCOUNTER — Other Ambulatory Visit: Payer: Self-pay | Admitting: Obstetrics and Gynecology

## 2013-01-13 ENCOUNTER — Encounter (HOSPITAL_COMMUNITY): Payer: Self-pay | Admitting: Pharmacist

## 2013-01-17 ENCOUNTER — Encounter (HOSPITAL_COMMUNITY): Payer: Self-pay

## 2013-01-17 ENCOUNTER — Encounter (HOSPITAL_COMMUNITY)
Admission: RE | Admit: 2013-01-17 | Discharge: 2013-01-17 | Disposition: A | Discharge status: Home / Self Care | Payer: BC Managed Care – PPO | Source: Ambulatory Visit | Attending: Obstetrics and Gynecology | Admitting: Obstetrics and Gynecology

## 2013-01-17 DIAGNOSIS — Z01812 Encounter for preprocedural laboratory examination: Secondary | ICD-10-CM | POA: Insufficient documentation

## 2013-01-17 DIAGNOSIS — Z01818 Encounter for other preprocedural examination: Secondary | ICD-10-CM | POA: Insufficient documentation

## 2013-01-17 HISTORY — DX: Heartburn: R12

## 2013-01-17 LAB — CBC
HCT: 37.2 % (ref 36.0–46.0)
MCHC: 32.8 g/dL (ref 30.0–36.0)
RDW: 12.2 % (ref 11.5–15.5)

## 2013-01-17 NOTE — Patient Instructions (Signed)
Your procedure is scheduled on:01/25/13  Enter through the Main Entrance at :6am Pick up desk phone and dial [phone] and inform us of your arrival.  Please call [phone] if you have any problems the morning of surgery.  Remember: Do not eat food or drink liquids, including water, after midnight:Monday   You may brush your teeth the morning of surgery.  Take these meds the morning of surgery with a sip of water: Zantac  DO NOT wear jewelry, eye make-up, lipstick,body lotion, or dark fingernail polish.  (Polished toes are ok) You may wear deodorant.  If you are to be admitted after surgery, leave suitcase in car until your room has been assigned. Patients discharged on the day of surgery will not be allowed to drive home. Wear loose fitting, comfortable clothes for your ride home.

## 2013-01-24 MED ORDER — DEXTROSE 5 % IV SOLN
2.0000 g | INTRAVENOUS | Status: AC
  Administered 2013-01-25: 2 g via INTRAVENOUS
  Filled 2013-01-24: qty 2

## 2013-01-25 ENCOUNTER — Ambulatory Visit (HOSPITAL_COMMUNITY): Payer: BC Managed Care – PPO | Admitting: Anesthesiology

## 2013-01-25 ENCOUNTER — Encounter (HOSPITAL_COMMUNITY)
Admission: RE | Disposition: A | Discharge status: Home / Self Care | Payer: Self-pay | Source: Ambulatory Visit | Attending: Obstetrics and Gynecology

## 2013-01-25 ENCOUNTER — Encounter (HOSPITAL_COMMUNITY): Payer: Self-pay | Admitting: Anesthesiology

## 2013-01-25 ENCOUNTER — Ambulatory Visit (HOSPITAL_COMMUNITY)
Admission: RE | Admit: 2013-01-25 | Discharge: 2013-01-26 | Disposition: A | Discharge status: Home / Self Care | Payer: BC Managed Care – PPO | Source: Ambulatory Visit | Attending: Obstetrics and Gynecology | Admitting: Obstetrics and Gynecology

## 2013-01-25 DIAGNOSIS — N920 Excessive and frequent menstruation with regular cycle: Secondary | ICD-10-CM | POA: Insufficient documentation

## 2013-01-25 DIAGNOSIS — N8000 Endometriosis of the uterus, unspecified: Secondary | ICD-10-CM | POA: Insufficient documentation

## 2013-01-25 DIAGNOSIS — D252 Subserosal leiomyoma of uterus: Secondary | ICD-10-CM | POA: Insufficient documentation

## 2013-01-25 DIAGNOSIS — D219 Benign neoplasm of connective and other soft tissue, unspecified: Secondary | ICD-10-CM

## 2013-01-25 DIAGNOSIS — D251 Intramural leiomyoma of uterus: Secondary | ICD-10-CM | POA: Insufficient documentation

## 2013-01-25 DIAGNOSIS — N852 Hypertrophy of uterus: Secondary | ICD-10-CM | POA: Insufficient documentation

## 2013-01-25 DIAGNOSIS — N946 Dysmenorrhea, unspecified: Secondary | ICD-10-CM | POA: Insufficient documentation

## 2013-01-25 HISTORY — PX: BILATERAL SALPINGECTOMY: SHX5743

## 2013-01-25 HISTORY — PX: LAPAROSCOPIC ASSISTED VAGINAL HYSTERECTOMY: SHX5398

## 2013-01-25 SURGERY — HYSTERECTOMY, VAGINAL, LAPAROSCOPY-ASSISTED
Anesthesia: General | Site: Abdomen | Wound class: Clean Contaminated

## 2013-01-25 MED ORDER — OXYCODONE-ACETAMINOPHEN 5-325 MG PO TABS
1.0000 | ORAL_TABLET | ORAL | Status: DC | PRN
  Administered 2013-01-25 (×3): 1 via ORAL
  Filled 2013-01-25: qty 2
  Filled 2013-01-25 (×2): qty 1

## 2013-01-25 MED ORDER — ONDANSETRON HCL 4 MG/2ML IJ SOLN
INTRAMUSCULAR | Status: DC | PRN
  Administered 2013-01-25: 4 mg via INTRAVENOUS

## 2013-01-25 MED ORDER — PHENYLEPHRINE 40 MCG/ML (10ML) SYRINGE FOR IV PUSH (FOR BLOOD PRESSURE SUPPORT)
PREFILLED_SYRINGE | INTRAVENOUS | Status: AC
  Filled 2013-01-25: qty 5

## 2013-01-25 MED ORDER — ONDANSETRON HCL 4 MG PO TABS: 4.0000 mg | ORAL_TABLET | Freq: Four times a day (QID) | ORAL | Status: DC | PRN

## 2013-01-25 MED ORDER — KETOROLAC TROMETHAMINE 30 MG/ML IJ SOLN
INTRAMUSCULAR | Status: DC | PRN
  Administered 2013-01-25: 30 mg via INTRAVENOUS

## 2013-01-25 MED ORDER — PROPOFOL 10 MG/ML IV BOLUS
INTRAVENOUS | Status: DC | PRN
  Administered 2013-01-25: 180 mg via INTRAVENOUS

## 2013-01-25 MED ORDER — ONDANSETRON HCL 4 MG/2ML IJ SOLN
INTRAMUSCULAR | Status: AC
  Filled 2013-01-25: qty 2

## 2013-01-25 MED ORDER — LIDOCAINE HCL (CARDIAC) 20 MG/ML IV SOLN
INTRAVENOUS | Status: DC | PRN
  Administered 2013-01-25: 50 mg via INTRAVENOUS

## 2013-01-25 MED ORDER — MIDAZOLAM HCL 2 MG/2ML IJ SOLN
INTRAMUSCULAR | Status: AC
  Filled 2013-01-25: qty 2

## 2013-01-25 MED ORDER — HYDROMORPHONE HCL PF 1 MG/ML IJ SOLN
INTRAMUSCULAR | Status: AC
  Administered 2013-01-25: 0.5 mg via INTRAVENOUS
  Filled 2013-01-25: qty 1

## 2013-01-25 MED ORDER — HYDROMORPHONE HCL PF 1 MG/ML IJ SOLN
0.2500 mg | INTRAMUSCULAR | Status: DC | PRN
  Administered 2013-01-25: 0.5 mg via INTRAVENOUS

## 2013-01-25 MED ORDER — DEXAMETHASONE SODIUM PHOSPHATE 10 MG/ML IJ SOLN
INTRAMUSCULAR | Status: DC | PRN
  Administered 2013-01-25: 10 mg via INTRAVENOUS

## 2013-01-25 MED ORDER — MEPERIDINE HCL 25 MG/ML IJ SOLN: 6.2500 mg | INTRAMUSCULAR | Status: DC | PRN

## 2013-01-25 MED ORDER — LIDOCAINE HCL (CARDIAC) 20 MG/ML IV SOLN
INTRAVENOUS | Status: AC
  Filled 2013-01-25: qty 5

## 2013-01-25 MED ORDER — LACTATED RINGERS IR SOLN
Status: DC | PRN
  Administered 2013-01-25: 3000 mL

## 2013-01-25 MED ORDER — FENTANYL CITRATE 0.05 MG/ML IJ SOLN
INTRAMUSCULAR | Status: DC | PRN
  Administered 2013-01-25 (×3): 100 ug via INTRAVENOUS
  Administered 2013-01-25: 50 ug via INTRAVENOUS

## 2013-01-25 MED ORDER — LACTATED RINGERS IV SOLN
INTRAVENOUS | Status: DC
  Administered 2013-01-25 (×2): via INTRAVENOUS
  Administered 2013-01-25: 50 mL/h via INTRAVENOUS

## 2013-01-25 MED ORDER — GLYCOPYRROLATE 0.2 MG/ML IJ SOLN
INTRAMUSCULAR | Status: DC | PRN
  Administered 2013-01-25: 0.2 mg via INTRAVENOUS

## 2013-01-25 MED ORDER — PROMETHAZINE HCL 25 MG/ML IJ SOLN: 6.2500 mg | INTRAMUSCULAR | Status: DC | PRN

## 2013-01-25 MED ORDER — IBUPROFEN 600 MG PO TABS
600.0000 mg | ORAL_TABLET | Freq: Four times a day (QID) | ORAL | Status: DC | PRN
  Administered 2013-01-25 – 2013-01-26 (×2): 600 mg via ORAL
  Filled 2013-01-25 (×2): qty 1

## 2013-01-25 MED ORDER — SIMETHICONE 80 MG PO CHEW: 80.0000 mg | CHEWABLE_TABLET | Freq: Four times a day (QID) | ORAL | Status: DC | PRN

## 2013-01-25 MED ORDER — NEOSTIGMINE METHYLSULFATE 1 MG/ML IJ SOLN
INTRAMUSCULAR | Status: AC
  Filled 2013-01-25: qty 1

## 2013-01-25 MED ORDER — ROCURONIUM BROMIDE 100 MG/10ML IV SOLN
INTRAVENOUS | Status: DC | PRN
  Administered 2013-01-25: 40 mg via INTRAVENOUS
  Administered 2013-01-25: 10 mg via INTRAVENOUS

## 2013-01-25 MED ORDER — ZOLPIDEM TARTRATE 5 MG PO TABS: 5.0000 mg | ORAL_TABLET | Freq: Every evening | ORAL | Status: DC | PRN

## 2013-01-25 MED ORDER — ONDANSETRON HCL 4 MG/2ML IJ SOLN: 4.0000 mg | Freq: Four times a day (QID) | INTRAMUSCULAR | Status: DC | PRN

## 2013-01-25 MED ORDER — PHENYLEPHRINE HCL 10 MG/ML IJ SOLN
INTRAMUSCULAR | Status: DC | PRN
  Administered 2013-01-25 (×3): 80 ug via INTRAVENOUS
  Administered 2013-01-25: 40 ug via INTRAVENOUS

## 2013-01-25 MED ORDER — KETOROLAC TROMETHAMINE 30 MG/ML IJ SOLN
INTRAMUSCULAR | Status: AC
  Filled 2013-01-25: qty 1

## 2013-01-25 MED ORDER — MIDAZOLAM HCL 5 MG/5ML IJ SOLN
INTRAMUSCULAR | Status: DC | PRN
  Administered 2013-01-25: 2 mg via INTRAVENOUS

## 2013-01-25 MED ORDER — BUPIVACAINE HCL (PF) 0.25 % IJ SOLN
INTRAMUSCULAR | Status: DC | PRN
  Administered 2013-01-25: 2 mL

## 2013-01-25 MED ORDER — NEOSTIGMINE METHYLSULFATE 1 MG/ML IJ SOLN
INTRAMUSCULAR | Status: DC | PRN
  Administered 2013-01-25: 1 mg via INTRAVENOUS

## 2013-01-25 MED ORDER — BUPIVACAINE HCL (PF) 0.25 % IJ SOLN
INTRAMUSCULAR | Status: AC
  Filled 2013-01-25: qty 30

## 2013-01-25 MED ORDER — LIDOCAINE-EPINEPHRINE 1 %-1:100000 IJ SOLN
INTRAMUSCULAR | Status: DC | PRN
  Administered 2013-01-25: 10 mL

## 2013-01-25 MED ORDER — FENTANYL CITRATE 0.05 MG/ML IJ SOLN
INTRAMUSCULAR | Status: AC
  Filled 2013-01-25: qty 5

## 2013-01-25 MED ORDER — PROPOFOL 10 MG/ML IV EMUL
INTRAVENOUS | Status: AC
  Filled 2013-01-25: qty 20

## 2013-01-25 MED ORDER — DEXAMETHASONE SODIUM PHOSPHATE 10 MG/ML IJ SOLN
INTRAMUSCULAR | Status: AC
  Filled 2013-01-25: qty 1

## 2013-01-25 MED ORDER — ROCURONIUM BROMIDE 50 MG/5ML IV SOLN
INTRAVENOUS | Status: AC
  Filled 2013-01-25: qty 1

## 2013-01-25 MED ORDER — DOCUSATE SODIUM 100 MG PO CAPS
100.0000 mg | ORAL_CAPSULE | Freq: Two times a day (BID) | ORAL | Status: DC
  Administered 2013-01-25: 100 mg via ORAL
  Filled 2013-01-25: qty 1

## 2013-01-25 MED ORDER — KETOROLAC TROMETHAMINE 30 MG/ML IJ SOLN: 15.0000 mg | Freq: Once | INTRAMUSCULAR | Status: DC | PRN

## 2013-01-25 MED ORDER — GLYCOPYRROLATE 0.2 MG/ML IJ SOLN
INTRAMUSCULAR | Status: AC
  Filled 2013-01-25: qty 1

## 2013-01-25 SURGICAL SUPPLY — 54 items
ADH SKN CLS APL DERMABOND .7 (GAUZE/BANDAGES/DRESSINGS) ×3
CANISTER SUCTION 2500CC (MISCELLANEOUS) ×4 IMPLANT
CATH ROBINSON RED A/P 16FR (CATHETERS) ×2 IMPLANT
CELLS DAT CNTRL 66122 CELL SVR (MISCELLANEOUS) IMPLANT
CLOTH BEACON ORANGE TIMEOUT ST (SAFETY) ×4 IMPLANT
CONT PATH 16OZ SNAP LID 3702 (MISCELLANEOUS) ×4 IMPLANT
COVER TABLE BACK 60X90 (DRAPES) ×4 IMPLANT
DECANTER SPIKE VIAL GLASS SM (MISCELLANEOUS) ×2 IMPLANT
DERMABOND ADVANCED (GAUZE/BANDAGES/DRESSINGS) ×1
DERMABOND ADVANCED .7 DNX12 (GAUZE/BANDAGES/DRESSINGS) ×3 IMPLANT
ELECT REM PT RETURN 9FT ADLT (ELECTROSURGICAL) ×4
ELECTRODE REM PT RTRN 9FT ADLT (ELECTROSURGICAL) ×1 IMPLANT
EVACUATOR SMOKE 8.L (FILTER) IMPLANT
FORCEPS CUTTING 33CM 5MM (CUTTING FORCEPS) ×2 IMPLANT
GLOVE ECLIPSE 7.0 STRL STRAW (GLOVE) ×8 IMPLANT
GOWN PREVENTION PLUS LG XLONG (DISPOSABLE) ×8 IMPLANT
GOWN PREVENTION PLUS XLARGE (GOWN DISPOSABLE) ×4 IMPLANT
GOWN STRL REIN XL XLG (GOWN DISPOSABLE) ×16 IMPLANT
NDL HYPO 25X1 1.5 SAFETY (NEEDLE) IMPLANT
NEEDLE HYPO 25X1 1.5 SAFETY (NEEDLE) IMPLANT
NS IRRIG 1000ML POUR BTL (IV SOLUTION) ×4 IMPLANT
PACK ABDOMINAL GYN (CUSTOM PROCEDURE TRAY) ×4 IMPLANT
PACK LAVH (CUSTOM PROCEDURE TRAY) ×4 IMPLANT
PAD OB MATERNITY 4.3X12.25 (PERSONAL CARE ITEMS) ×4 IMPLANT
PROTECTOR NERVE ULNAR (MISCELLANEOUS) ×4 IMPLANT
RETRACTOR WND ALEXIS 18 MED (MISCELLANEOUS) IMPLANT
RETRACTOR WND ALEXIS 25 LRG (MISCELLANEOUS) IMPLANT
RTRCTR WOUND ALEXIS 18CM MED (MISCELLANEOUS)
RTRCTR WOUND ALEXIS 25CM LRG (MISCELLANEOUS)
SCISSORS LAP 5X35 DISP (ENDOMECHANICALS) IMPLANT
SET IRRIG TUBING LAPAROSCOPIC (IRRIGATION / IRRIGATOR) ×2 IMPLANT
SOLUTION ELECTROLUBE (MISCELLANEOUS) IMPLANT
SPONGE LAP 18X18 X RAY DECT (DISPOSABLE) ×8 IMPLANT
STAPLER VISISTAT 35W (STAPLE) ×4 IMPLANT
STRIP CLOSURE SKIN 1/4X3 (GAUZE/BANDAGES/DRESSINGS) IMPLANT
SUT MNCRL 0 MO-4 VIOLET 18 CR (SUTURE) ×9 IMPLANT
SUT MNCRL 0 VIOLET 6X18 (SUTURE) ×3 IMPLANT
SUT MNCRL AB 0 CT1 27 (SUTURE) ×8 IMPLANT
SUT MON AB 2-0 CT1 27 (SUTURE) ×2 IMPLANT
SUT MONOCRYL 0 6X18 (SUTURE) ×1
SUT MONOCRYL 0 MO 4 18  CR/8 (SUTURE) ×3
SUT SILK 3 0 SH 30 (SUTURE) IMPLANT
SUT VIC AB 2-0 CT1 27 (SUTURE) ×12
SUT VIC AB 2-0 CT1 TAPERPNT 27 (SUTURE) ×9 IMPLANT
SUT VICRYL 0 UR6 27IN ABS (SUTURE) ×8 IMPLANT
SUT VICRYL RAPIDE 3 0 (SUTURE) ×8 IMPLANT
SYR CONTROL 10ML LL (SYRINGE) ×2 IMPLANT
TOWEL OR 17X24 6PK STRL BLUE (TOWEL DISPOSABLE) ×8 IMPLANT
TRAY FOLEY BAG SILVER LF 14FR (CATHETERS) ×4 IMPLANT
TRAY FOLEY CATH 14FR (SET/KITS/TRAYS/PACK) ×4 IMPLANT
TROCAR BALLN 12MMX100 BLUNT (TROCAR) ×4 IMPLANT
TROCAR XCEL NON-BLD 5MMX100MML (ENDOMECHANICALS) ×8 IMPLANT
WARMER LAPAROSCOPE (MISCELLANEOUS) ×4 IMPLANT
WATER STERILE IRR 1000ML POUR (IV SOLUTION) ×4 IMPLANT

## 2013-01-25 NOTE — Progress Notes (Signed)
Pt is a 47 yr old black female, G0P0 who presents to the OR for a LAVH secondary to fibroids, dysmenorrhea and heavy menses. These symptoms have been present for several yrs. She has tried OCPs, Lysteda,and NSAIDs. She has normal bowel and bladder function. Pt has 3 fibroids, the largest is 6.6cm PE: VSSAF        HEENT- wnl        Abd- soft, non tender, no masses        Pelvic- ext-wnl                    Vagina- wnl                    cx- nullip, no cmt                    Ut enlarged, irregular c/w fibroids                    Adnexa- no masses IMP/ Fibroids, dysmenorrhea, heavy menses PLAN/ plan for LAVH, possible TAH

## 2013-01-25 NOTE — Anesthesia Preprocedure Evaluation (Signed)
Anesthesia Evaluation  Patient identified by MRN, date of birth, ID band Patient awake    Reviewed: Allergy & Precautions, H&P , NPO status , Patient's Chart, lab work & pertinent test results  Airway Mallampati: I TM Distance: >3 FB Neck ROM: full    Dental no notable dental hx. (+) Teeth Intact   Pulmonary neg pulmonary ROS,    Pulmonary exam normal       Cardiovascular negative cardio ROS      Neuro/Psych negative neurological ROS  negative psych ROS   GI/Hepatic negative GI ROS, Neg liver ROS,   Endo/Other  negative endocrine ROS  Renal/GU negative Renal ROS  negative genitourinary   Musculoskeletal negative musculoskeletal ROS (+)   Abdominal Normal abdominal exam  (+)   Peds  Hematology negative hematology ROS (+)   Anesthesia Other Findings   Reproductive/Obstetrics negative OB ROS                           Anesthesia Physical Anesthesia Plan  ASA: II  Anesthesia Plan: General   Post-op Pain Management:    Induction: Intravenous  Airway Management Planned: Oral ETT  Additional Equipment:   Intra-op Plan:   Post-operative Plan: Extubation in OR  Informed Consent: I have reviewed the patients History and Physical, chart, labs and discussed the procedure including the risks, benefits and alternatives for the proposed anesthesia with the patient or authorized representative who has indicated his/her understanding and acceptance.   Dental Advisory Given  Plan Discussed with: CRNA and Surgeon  Anesthesia Plan Comments:         Anesthesia Quick Evaluation
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
## 2013-01-25 NOTE — Anesthesia Postprocedure Evaluation (Signed)
  Anesthesia Post Note  Patient: Karla Howell  Procedure(s) Performed: Procedure(s) (LRB): LAPAROSCOPIC ASSISTED VAGINAL HYSTERECTOMY (N/A) BILATERAL SALPINGECTOMY (Bilateral)  Anesthesia type: GA  Patient location: PACU  Post pain: Pain level controlled  Post assessment: Post-op Vital signs reviewed  Last Vitals:  Filed Vitals:   01/25/13 1100  BP:   Pulse: 80  Temp: 36.7 C  Resp: 12    Post vital signs: Reviewed  Level of consciousness: sedated  Complications: No apparent anesthesia complications

## 2013-01-25 NOTE — Transfer of Care (Signed)
Anesthesia Post Note  Patient: Karla Howell  Procedure(s) Performed: Procedure(s) (LRB): LAPAROSCOPIC ASSISTED VAGINAL HYSTERECTOMY (N/A) BILATERAL SALPINGECTOMY (Bilateral)  Anesthesia type: General  Patient location: [facility]  Post pain: Pain level controlled  Post assessment: Post-op Vital signs reviewed  Last Vitals:  Filed Vitals:   01/25/13 1217  BP: 120/79  Pulse: 96  Temp: 36.7 C  Resp: 14    Post vital signs: Reviewed  Level of consciousness: sedated  Complications: No apparent anesthesia complications

## 2013-01-25 NOTE — Op Note (Signed)
Karla Howell, Karla Howell            ACCOUNT NO.:  [id number]  MEDICAL RECORD NO.:  [id number]  LOCATION:  9305                          FACILITY:  [facility]  PHYSICIAN:  Malva Limes, M.D.    DATE OF BIRTH:  [birth year]  DATE OF PROCEDURE:  01/25/2013 DATE OF DISCHARGE:                              OPERATIVE REPORT   PREOPERATIVE DIAGNOSES: 1. Pelvic pain. 2. Uterine fibroids. 3. Dysmenorrhea. 4. Menorrhagia.  POSTOPERATIVE DIAGNOSES: 1. Pelvic pain. 2. Uterine fibroids. 3. Dysmenorrhea. 4. Menorrhagia. 5. Pelvic adhesions.  PROCEDURE:  Laparoscopic-assisted vaginal hysterectomy with bilateral salpingectomy.  SURGEON:  Malva Limes, M.D.  ASSISTANT:  Luvenia Redden, M.D.  ANESTHESIA:  General and local.  ANTIBIOTICS:  Cefotan 2 g.  DRAINS:  Foley bedside drainage.  ESTIMATED BLOOD LOSS:  300 mL.  SPECIMENS:  Uterus, cervix, and fallopian tubes sent to pathology.  PROCEDURE IN DETAIL:  The patient was taken to the operating room, where general anesthetic was administered without difficulty.  She was placed in the dorsal lithotomy position.  She was prepped and draped in the usual fashion for this procedure.  An exam under anesthesia revealed a 12-week size uterus consistent with fibroids.  The bladder was drained with a red rubber catheter.  A Hulka tenaculum was applied to the anterior cervical lip.  The umbilicus was injected with 0.25% Marcaine. A 2-cm infraumbilical incision was made through the skin.  The fascia was grasped and entered with the Mayo scissors.  The parietal peritoneum was entered with the Metzenbaum scissors.  A 2-0 Vicryl suture was placed in the pursestring fashion.  The Hasson cannula was placed into the abdominal cavity, and 3 L of carbon dioxide was insufflated.  The patient was then placed in Trendelenburg.  A 5-mm ports were placed in the right and left lower quadrants under direct visualization.  It was noted that the patient had an  adhesion between the omentum in the right lower quadrant.  This was taken down with sharp dissection.  At this point, the pelvis was examined and found to have no evidence of endometriosis.  The ureters were identified.  The left tube was then lifted, and the mesosalpinx cauterized and transected.  The ovarian ligament was cauterized and transected.  The round ligament was cauterized and transected.  The broad ligament was then cauterized and transected down to the level of the uterine artery.  A similar procedure was performed on the opposite side.  Once this was accomplished, hemostasis was felt to be adequate.  The vaginal hysterectomy was begun. The pneumoperitoneum was released.  The weighted speculum placed in the vagina.  The cervix was grasped with a single-tooth tenaculum and injected with 1% lidocaine with epinephrine.  The anterior cul-de-sac was entered with sharp dissection.  Bladder pillars were bilaterally clamped, cut and ligated with 0 Monocryl suture.  The posterior cul-de- sac was entered.  The uterosacral ligaments were bilaterally clamped, cut, and ligated with 0 Monocryl suture.  The cardinal ligaments were bilateral clamped and cut with 0 Monocryl suture.  The uterine vessels were then bilaterally clamped, cut, and ligated with 0 Monocryl suture. The uterus was then delivered with traction.  All pedicles were felt to be  hemostatic.  The posterior cuff was then closed using 2-0 Vicryl in a running, locking fashion.  The uterosacral ligaments were reapproximated in midline.  The remaining vaginal cuff was closed vertically using 2-0 Vicryl suture in a running, locking fashion.  A Foley catheter had been placed in the bladder.  The attention was then turned to the scope where the pneumoperitoneum was re-established and then the Nezhat suction irrigator was used to copiously irrigate the pedicles and checked for hemostasis.  Everything appeared to be dry.  At this point,  the ports were removed.  There was no evidence of bleeding.  Pneumoperitoneum was released.  The fascia was closed with 0 Vicryl suture and the skin with 3-0 Vicryl suture in a subcutaneous fashion.  The 5-mm ports were closed using Dermabond.  The patient was woken and taken to recovery in stable condition.  Instrument and lap counts were correct x2.          ______________________________ Malva Limes, M.D.     MA/MEDQ  D:  01/25/2013  T:  01/25/2013  Job:  [id number]

## 2013-01-25 NOTE — Transfer of Care (Signed)
Immediate Anesthesia Transfer of Care Note  Patient: Karla Howell  Procedure(s) Performed: Procedure(s): LAPAROSCOPIC ASSISTED VAGINAL HYSTERECTOMY (N/A) BILATERAL SALPINGECTOMY (Bilateral)  Patient Location: PACU  Anesthesia Type:General  Level of Consciousness: awake  Airway & Oxygen Therapy: Patient Spontanous Breathing  Post-op Assessment: Report given to PACU RN  Post vital signs: stable  Filed Vitals:   01/25/13 0631  BP: 160/100  Pulse: 93  Temp: 37 C  Resp: 18    Complications: No apparent anesthesia complications

## 2013-01-26 ENCOUNTER — Encounter (HOSPITAL_COMMUNITY): Payer: Self-pay

## 2013-01-26 LAB — CBC
MCH: 29 pg (ref 26.0–34.0)
MCHC: 33.4 g/dL (ref 30.0–36.0)
Platelets: 293 10*3/uL (ref 150–400)
RDW: 12.2 % (ref 11.5–15.5)

## 2013-01-26 MED ORDER — OXYCODONE-ACETAMINOPHEN 5-325 MG PO TABS: 1.0000 | ORAL_TABLET | ORAL | Status: DC | PRN

## 2013-01-26 NOTE — Progress Notes (Signed)
Patient discharged to home with family members. Condition stable. Patient ambulated downstairs to car with family. No equipment ordered for discharge home.

## 2013-01-26 NOTE — Discharge Summary (Signed)
Karla Howell, Karla Howell            ACCOUNT NO.:  [id number]  MEDICAL RECORD NO.:  [id number]  LOCATION:  9305                          FACILITY:  [facility]  PHYSICIAN:  Malva Limes, M.D.    DATE OF BIRTH:  [birth year]  DATE OF ADMISSION:  01/25/2013 DATE OF DISCHARGE:  01/26/2013                              DISCHARGE SUMMARY   PREOPERATIVE DIAGNOSES: 1. Symptomatic uterine fibroids. 2. Dysmenorrhea. 3. Lower abdominal pain.  PRINCIPAL DISCHARGE PROCEDURE:  Laparoscopic-assisted vaginal hysterectomy with bilateral salpingectomy.  HISTORY OF PRESENT ILLNESS:  [name] is a 47 year old black female, G0 P0, who had been bothered with her fibroids for the last 2-3 years.  She had been treated with oral contraceptive pills and other agents without improvement.  She expressed a desire for permanent sterilization.  HOSPITAL COURSE:  The patient was admitted, underwent a laparoscopic hysterectomy with bilateral salpingectomy.  A complete description can be found in dictated operative note.  The weight of the uterus was 310 g.  The final pathology is pending at this time.  Complete description of operative procedure can be found in dictated operative note.  The patient's postop operative course was benign.  At the time of discharge, she was eating a regular diet.  She had flatus.  She was ambulating without difficulty.  She had no vaginal bleeding.  Her incisions appeared to be healing well.  She had no complaints.  The pathology was pending at the time of discharge.  The patient will be discharged to home.  She will follow up in the office in 4 weeks.  She will be sent home with Percocet to take p.r.n.          ______________________________ Malva Limes, M.D.     MA/MEDQ  D:  01/26/2013  T:  01/26/2013  Job:  [id number]

## 2013-02-01 ENCOUNTER — Other Ambulatory Visit: Payer: Self-pay | Admitting: Internal Medicine

## 2013-02-15 ENCOUNTER — Ambulatory Visit (INDEPENDENT_AMBULATORY_CARE_PROVIDER_SITE_OTHER): Payer: BC Managed Care – PPO | Admitting: Family

## 2013-02-15 ENCOUNTER — Encounter: Payer: Self-pay | Admitting: Family

## 2013-02-15 DIAGNOSIS — T148XXA Other injury of unspecified body region, initial encounter: Secondary | ICD-10-CM

## 2013-02-15 DIAGNOSIS — M545 Low back pain, unspecified: Secondary | ICD-10-CM

## 2013-02-15 MED ORDER — CYCLOBENZAPRINE HCL 10 MG PO TABS: 10.0000 mg | ORAL_TABLET | Freq: Three times a day (TID) | ORAL | Status: DC | PRN
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
## 2013-02-15 NOTE — Patient Instructions (Addendum)
Muscle Strain  Muscle strain occurs when a muscle is stretched beyond its normal length. A small number of muscle fibers generally are torn. This is especially common in athletes. This happens when a sudden, violent force placed on a muscle stretches it too far. Usually, recovery from muscle strain takes 1 to 2 weeks. Complete healing will take 5 to 6 weeks.   HOME CARE INSTRUCTIONS    While awake, apply ice to the sore muscle for the first 2 days after the injury.   Put ice in a plastic bag.   Place a towel between your skin and the bag.   Leave the ice on for 15-20 minutes each hour.   Do not use the strained muscle for several days, until you no longer have pain.   You may wrap the injured area with an elastic bandage for comfort. Be careful not to wrap it too tightly. This may interfere with blood circulation or increase swelling.   Only take over-the-counter or prescription medicines for pain, discomfort, or fever as directed by your caregiver.  SEEK MEDICAL CARE IF:   You have increasing pain or swelling in the injured area.  MAKE SURE YOU:    Understand these instructions.   Will watch your condition.   Will get help right away if you are not doing well or get worse.  Document Released: 05/26/2005 Document Revised: 08/18/2011 Document Reviewed: 06/07/2011  [employer] Patient Information 2014 [employer].

## 2013-03-26 ENCOUNTER — Encounter: Payer: Self-pay | Admitting: Family Medicine

## 2013-03-26 ENCOUNTER — Ambulatory Visit (INDEPENDENT_AMBULATORY_CARE_PROVIDER_SITE_OTHER): Payer: BC Managed Care – PPO | Admitting: Family Medicine

## 2013-03-26 VITALS — BP 120/82 | Temp 99.7°F | Wt 159.0 lb

## 2013-03-26 DIAGNOSIS — J309 Allergic rhinitis, unspecified: Secondary | ICD-10-CM

## 2013-03-26 MED ORDER — PREDNISONE 20 MG PO TABS: ORAL_TABLET | ORAL | Status: DC

## 2013-03-26 MED ORDER — FLUTICASONE PROPIONATE 50 MCG/ACT NA SUSP: 2.0000 | Freq: Every day | NASAL | Status: DC

## 2013-03-26 NOTE — Progress Notes (Signed)
  Subjective:    Patient ID: Karla Howell, female    DOB: 1965/12/18, 47 y.o.   MRN: [id number]  HPI Karla Howell is a 47 year old female nonsmoker who comes in today for evaluation of allergic rhinitis  Her worst problem is always in the fall. She has had congestion sneezing postnasal drip. This week she developed a cough. She's not been wheezing. She states she flew on an airplane the first week and had severe pressure in her years when the plane landed. She said no drainage her years. No hearing loss.  She's a nonsmoker     Review of Systems Review of systems otherwise negative    Objective:   Physical Exam Well-developed well-nourished female no acute distress HEENT negative except for cerumen impaction in her right ear neck was supple no adenopathy nose was normal septum normal 3+ nasal edema  Lungs are normal no wheezing       Assessment & Plan:  Allergic rhinitis,,,,,,,, since her symptoms are rather severe in she has to fly again on Monday with her occupation I will start her on a short course of oral prednisone. We'll then treat her allergic symptoms with it Claritin plain plus a steroid nasal spray.

## 2013-03-26 NOTE — Patient Instructions (Signed)
,   prednisone 20 mg,,,,,,,,,,, 2 tabs x3 days, 1 tab x3 days, a half a tab x3 days, then a half a tablet Monday Wednesday Friday for a two-week taper  Plain Zyrtec one tablet at bedtime  One shot of steroid nasal spray up each nostril at bedtime starting when you finish the prednisone  Take afrin nasal spray and chewing gum when you're flying

## 2013-04-10 ENCOUNTER — Emergency Department (HOSPITAL_COMMUNITY)
Admission: EM | Admit: 2013-04-10 | Discharge: 2013-04-10 | Disposition: A | Discharge status: Home / Self Care | Payer: BC Managed Care – PPO | Source: Home / Self Care | Attending: Family Medicine | Admitting: Family Medicine

## 2013-04-10 ENCOUNTER — Encounter (HOSPITAL_COMMUNITY): Payer: Self-pay | Admitting: Emergency Medicine

## 2013-04-10 DIAGNOSIS — R0982 Postnasal drip: Secondary | ICD-10-CM

## 2013-04-10 MED ORDER — IPRATROPIUM BROMIDE 0.03 % NA SOLN: 2.0000 | Freq: Two times a day (BID) | NASAL | Status: DC

## 2013-04-10 MED ORDER — AMOXICILLIN 500 MG PO CAPS: 1000.0000 mg | ORAL_CAPSULE | Freq: Two times a day (BID) | ORAL | Status: DC

## 2013-04-10 MED ORDER — PREDNISONE 10 MG PO KIT: PACK | ORAL | Status: DC

## 2013-04-10 NOTE — ED Notes (Signed)
Pt reports sinus pressure, dry cough and bilateral ear pressure  Without fever  The past 3 weeks

## 2013-04-10 NOTE — ED Provider Notes (Signed)
Karla Howell is a 47 y.o. female who presents to Urgent Care today for right ear and nasal pressure and mild pain associated with postnasal drip and cough. This is been present for about 3 weeks. She was seen by her primary care provider who gave her course of prednisone and Flonase. The prednisone worked temporarily however the Flonase has not worked at all. She's tried some over-the-counter oral decongestions as well as Afrin nasal spray. She denies any chest pain shortness of breath palpitations nausea vomiting or diarrhea. She feels well otherwise. Her symptoms are moderate.   Past Medical History  Diagnosis Date  . Hemorrhoids   . Allergy   . IBS (irritable bowel syndrome)   . Anemia     history of   . Heartburn    History  Substance Use Topics  . Smoking status: Never Smoker   . Smokeless tobacco: Not on file  . Alcohol Use: Yes     Comment: socially   ROS as above:  Medications reviewed. No current facility-administered medications for this encounter.   Current Outpatient Prescriptions  Medication Sig Dispense Refill  . ranitidine (ZANTAC) 150 MG tablet Take 150 mg by mouth daily as needed for heartburn.      . vitamin C (ASCORBIC ACID) 500 MG tablet Take 500 mg by mouth daily.      [name] Kitchen zolpidem (AMBIEN) 5 MG tablet TAKE 1 TABLET BY MOUTH AT BEDTIME AS NEEDED FOR SLEEP  15 tablet  1  . amoxicillin (AMOXIL) 500 MG capsule Take 2 capsules (1,000 mg total) by mouth 2 (two) times daily.  28 capsule  0  . ibuprofen (ADVIL,MOTRIN) 200 MG tablet Take 600 mg by mouth every 4 (four) hours as needed for pain.      [name] Kitchen ipratropium (ATROVENT) 0.03 % nasal spray Place 2 sprays into the nose every 12 (twelve) hours.  30 mL  2  . PredniSONE 10 MG KIT 12 day dose pack po  1 kit  0  . [DISCONTINUED] fluticasone (FLONASE) 50 MCG/ACT nasal spray Place 2 sprays into the nose daily.  16 g  6    Exam:  BP 137/90  Pulse 79  Temp(Src) 98 F (36.7 C) (Oral)  Resp 16  SpO2 98% Gen: Well  NAD HEENT: EOMI,  MMM , tympanic membranes are nonerythematous bilaterally. Posterior pharynx shows cobblestoning. Nontender maxillary or frontal sinuses bilaterally Lungs: CTABL Nl WOB Heart: RRR no MRG Abd: NABS, NT, ND Exts: Non edematous BL  LE, warm and well perfused.   No results found for this or any previous visit (from the past 24 hour(s)). No results found.  Assessment and Plan: 47 y.o. female with postnasal drip. This is likely due to viral process. Plan to treat with Atrovent nasal spray, and prednisone dosepak. Additionally use amoxicillin if not improved. Will refer to [facility] ear nose and throat if not improving. Discussed warning signs or symptoms. Please see discharge instructions. Patient expresses understanding.      Rodolph Bong, MD 04/10/13 [phone]

## 2013-12-24 ENCOUNTER — Ambulatory Visit (INDEPENDENT_AMBULATORY_CARE_PROVIDER_SITE_OTHER): Payer: BC Managed Care – PPO | Admitting: Family

## 2013-12-24 VITALS — BP 134/84 | HR 82 | Temp 98.5°F | Wt 164.0 lb

## 2013-12-24 DIAGNOSIS — J329 Chronic sinusitis, unspecified: Secondary | ICD-10-CM | POA: Insufficient documentation

## 2013-12-24 DIAGNOSIS — J018 Other acute sinusitis: Secondary | ICD-10-CM

## 2013-12-24 MED ORDER — AMOXICILLIN-POT CLAVULANATE 875-125 MG PO TABS: 1.0000 | ORAL_TABLET | Freq: Two times a day (BID) | ORAL | Status: DC

## 2013-12-24 NOTE — Patient Instructions (Addendum)
You can add claritin D one tab twice daily for the next 1 week, then switch to traditional claritin 10mg  once daily. Start augmentin for sinusitis.  Call if symptoms worsen, or if not improved in 2-3 days.   Sinusitis Sinusitis is redness, soreness, and swelling (inflammation) of the paranasal sinuses. Paranasal sinuses are air pockets within the bones of your face (beneath the eyes, the middle of the forehead, or above the eyes). In healthy paranasal sinuses, mucus is able to drain out, and air is able to circulate through them by way of your nose. However, when your paranasal sinuses are inflamed, mucus and air can become trapped. This can allow bacteria and other germs to grow and cause infection. Sinusitis can develop quickly and last only a short time (acute) or continue over a long period (chronic). Sinusitis that lasts for more than 12 weeks is considered chronic.  CAUSES  Causes of sinusitis include:  Allergies.  Structural abnormalities, such as displacement of the cartilage that separates your nostrils (deviated septum), which can decrease the air flow through your nose and sinuses and affect sinus drainage.  Functional abnormalities, such as when the small hairs (cilia) that line your sinuses and help remove mucus do not work properly or are not present. SYMPTOMS  Symptoms of acute and chronic sinusitis are the same. The primary symptoms are pain and pressure around the affected sinuses. Other symptoms include:  Upper toothache.  Earache.  Headache.  Bad breath.  Decreased sense of smell and taste.  A cough, which worsens when you are lying flat.  Fatigue.  Fever.  Thick drainage from your nose, which often is green and may contain pus (purulent).  Swelling and warmth over the affected sinuses. DIAGNOSIS  Your caregiver will perform a physical exam. During the exam, your caregiver may:  Look in your nose for signs of abnormal growths in your nostrils (nasal  polyps).  Tap over the affected sinus to check for signs of infection.  View the inside of your sinuses (endoscopy) with a special imaging device with a light attached (endoscope), which is inserted into your sinuses. If your caregiver suspects that you have chronic sinusitis, one or more of the following tests may be recommended:  Allergy tests.  Nasal culture--A sample of mucus is taken from your nose and sent to a lab and screened for bacteria.  Nasal cytology--A sample of mucus is taken from your nose and examined by your caregiver to determine if your sinusitis is related to an allergy. TREATMENT  Most cases of acute sinusitis are related to a viral infection and will resolve on their own within 10 days. Sometimes medicines are prescribed to help relieve symptoms (pain medicine, decongestants, nasal steroid sprays, or saline sprays).  However, for sinusitis related to a bacterial infection, your caregiver will prescribe antibiotic medicines. These are medicines that will help kill the bacteria causing the infection.  Rarely, sinusitis is caused by a fungal infection. In theses cases, your caregiver will prescribe antifungal medicine. For some cases of chronic sinusitis, surgery is needed. Generally, these are cases in which sinusitis recurs more than 3 times per year, despite other treatments. HOME CARE INSTRUCTIONS   Drink plenty of water. Water helps thin the mucus so your sinuses can drain more easily.  Use a humidifier.  Inhale steam 3 to 4 times a day (for example, sit in the bathroom with the shower running).  Apply a warm, moist washcloth to your face 3 to 4 times a day,  or as directed by your caregiver.  Use saline nasal sprays to help moisten and clean your sinuses.  Take over-the-counter or prescription medicines for pain, discomfort, or fever only as directed by your caregiver. SEEK IMMEDIATE MEDICAL CARE IF:  You have increasing pain or severe headaches.  You have  nausea, vomiting, or drowsiness.  You have swelling around your face.  You have vision problems.  You have a stiff neck.  You have difficulty breathing. MAKE SURE YOU:   Understand these instructions.  Will watch your condition.  Will get help right away if you are not doing well or get worse. Document Released: 05/26/2005 Document Revised: 08/18/2011 Document Reviewed: 06/10/2011 [facility] Patient Information 2015 [place], [place]. This information is not intended to replace advice given to you by your health care provider. Make sure you discuss any questions you have with your health care provider.

## 2013-12-24 NOTE — Progress Notes (Signed)
Subjective:    Patient ID: Karla Howell, female    DOB: Jun 17, 1965, 48 y.o.   MRN: [id number]  HPI  Karla Howell is a 48 yr old female who presents today with chief complaint of headache.  Reports 3 week hx of HA off/on.  + frontal sinus pressure.  She reports some post nasal drip and cough.  She reports + allergies. Denies fever.  Has had a few sinus infections in the past.  Last one was last fall. She is using flonase and ipratopium without good relief.    Review of Systems See HPI  Past Medical History  Diagnosis Date  . Hemorrhoids   . Allergy   . IBS (irritable bowel syndrome)   . Anemia     history of   . Heartburn     History   Social History  . Marital Status: Single    Spouse Name: N/A    Number of Children: N/A  . Years of Education: N/A   Occupational History  . insurance    Social History Main Topics  . Smoking status: Never Smoker   . Smokeless tobacco: Not on file  . Alcohol Use: Yes     Comment: socially  . Drug Use: No  . Sexual Activity: Yes    Birth Control/ Protection: Condom   Other Topics Concern  . Not on file   Social History Narrative  . No narrative on file    Past Surgical History  Procedure Laterality Date  . No past surgeries    . Laparoscopic assisted vaginal hysterectomy N/A 01/25/2013    Procedure: LAPAROSCOPIC ASSISTED VAGINAL HYSTERECTOMY;  Surgeon: Olga Millers, MD;  Location: [facility];  Service: Gynecology;  Laterality: N/A;  . Bilateral salpingectomy Bilateral 01/25/2013    Procedure: BILATERAL SALPINGECTOMY;  Surgeon: Olga Millers, MD;  Location: [facility];  Service: Gynecology;  Laterality: Bilateral;  . Abdominal hysterectomy      Family History  Problem Relation Age of Onset  . Hypertension Mother   . Hyperlipidemia Mother   . Hypertension Sister   . Heart disease Maternal Grandmother   . Diabetes Maternal Grandmother   . Heart disease Maternal Grandfather     No Known Allergies  Current Outpatient  Prescriptions on File Prior to Visit  Medication Sig Dispense Refill  . amoxicillin (AMOXIL) 500 MG capsule Take 2 capsules (1,000 mg total) by mouth 2 (two) times daily.  28 capsule  0  . ibuprofen (ADVIL,MOTRIN) 200 MG tablet Take 600 mg by mouth every 4 (four) hours as needed for pain.      [name] Kitchen ipratropium (ATROVENT) 0.03 % nasal spray Place 2 sprays into the nose every 12 (twelve) hours.  30 mL  2  . PredniSONE 10 MG KIT 12 day dose pack po  1 kit  0  . ranitidine (ZANTAC) 150 MG tablet Take 150 mg by mouth daily as needed for heartburn.      . vitamin C (ASCORBIC ACID) 500 MG tablet Take 500 mg by mouth daily.      [name] Kitchen zolpidem (AMBIEN) 5 MG tablet TAKE 1 TABLET BY MOUTH AT BEDTIME AS NEEDED FOR SLEEP  15 tablet  1  . [DISCONTINUED] fluticasone (FLONASE) 50 MCG/ACT nasal spray Place 2 sprays into the nose daily.  16 g  6   No current facility-administered medications on file prior to visit.    BP 134/84  Pulse 82  Temp(Src) 98.5 F (36.9 C) (Oral)  Wt 164 lb (74.39  kg)  SpO2 97%  LMP 01/06/2013       Objective:   Physical Exam  Constitutional: She is oriented to person, place, and time. She appears well-developed and well-nourished. No distress.  HENT:  Head: Normocephalic and atraumatic.  Bilateral TM's occluded by cerumen  + frontal and maxillary sinus tenderness to palpation  Cardiovascular: Normal rate and regular rhythm.   No murmur heard. Pulmonary/Chest: Effort normal and breath sounds normal. No respiratory distress. She has no wheezes. She has no rales. She exhibits no tenderness.  Neurological: She is alert and oriented to person, place, and time.  Psychiatric: She has a normal mood and affect. Her behavior is normal. Judgment and thought content normal.          Assessment & Plan:

## 2013-12-24 NOTE — Assessment & Plan Note (Signed)
Plan as follows: You can add claritin D one tab twice daily for the next 1 week, then switch to traditional claritin 10mg  once daily. Start augmentin for sinusitis.  Call if symptoms worsen, or if not improved in 2-3 days.

## 2014-03-15 ENCOUNTER — Ambulatory Visit (INDEPENDENT_AMBULATORY_CARE_PROVIDER_SITE_OTHER): Payer: BC Managed Care – PPO | Admitting: Family Medicine

## 2014-03-15 ENCOUNTER — Encounter: Payer: Self-pay | Admitting: Family Medicine

## 2014-03-15 VITALS — BP 132/88 | HR 64 | Temp 98.7°F | Wt 162.0 lb

## 2014-03-15 DIAGNOSIS — F411 Generalized anxiety disorder: Secondary | ICD-10-CM

## 2014-03-15 MED ORDER — CITALOPRAM HYDROBROMIDE 20 MG PO TABS: 20.0000 mg | ORAL_TABLET | Freq: Every day | ORAL | Status: DC

## 2014-03-15 MED ORDER — ZOLPIDEM TARTRATE 5 MG PO TABS: ORAL_TABLET | ORAL | Status: DC

## 2014-03-15 NOTE — Patient Instructions (Addendum)
Refilled ambien.   Believe you have GAD. Start Citalopram 20mg . Likely at least a year of therapy.  I advise 150 minutes of exercise per week as this can be very helpful.  See me in 2 weeks if in town. Definitely see me in 6 weeks.  If out of town-I need you to message me to let me know the following regarding medication:   1. Safe-any thoughts of hurting yourself or others? 2. TOlerable-side effects are resonable? 3. Efficacious-are you seeing improvement.   GAD 7 total (0-not at all, 1 several days, 2 half days, 3 nearly everyday):  1. Feeling nervous, anxious or on edge: ____ 2. Not being able to stop or control worrying: ____ 3. Worrying too much about different things:____ 4. Trouble relaxing:____ 5. Being so restless that it is hard to sit still:____ 6. Becoming easily annoyed or irritable: ____ 7. Feeling afraid as if something awful might happen:____   Taking the medicine as directed and not missing any doses is one of the best things you can do to treat your Generalized Anxiety Disorder.  Here are some things to keep in mind:  1) Side effects (stomach upset, some increased anxiety) may happen before you notice a benefit.  These side effects typically go away over time. 2) Changes to your dose of medicine or a change in medication all together is sometimes necessary 3) Most people need to be on medication at least 6-12 months 4) Many people will notice an improvement within two weeks but the full effect of the medication can take up to 4-6 weeks 5) Stopping the medication when you start feeling better often results in a return of symptoms 6) If you start having thoughts of hurting yourself or others after starting this medicine, please call me at [phone] immediately.    Ge

## 2014-03-15 NOTE — Progress Notes (Signed)
Karla Reddish, MD Phone: [phone]  Subjective:  Patient presents today to establish care with me as their new primary care provider. Patient was formerly a patient of Dr. Arnoldo Morale. Chief complaint-noted.   Insomnia Working well with 5mg  ambien. No daytimes sleepiness next day.  ROS-No vivid dreams. No SI.   Anxiety/concern for Generalized Anxiety Disorder Great deal of stress related to work as on the road a lot. Trouble finding time to eat and to exercise.  Onset (min 6 months): several years GAD 7 total: 13 1. Feeling nervous, anxious or on edge: 3 2. Not being able to stop or control worrying: 2 3. Worrying too much about different things:0 4. Trouble relaxing:3 5. Being so restless that it is hard to sit still:3 6. Becoming easily annoyed or irritable: 3 7. Feeling afraid as if something awful might happen:0  Patient denies history of smoking/nicotine, caffeine intake (daily), regular decongestants, albuterol no Denies traumatic events, abuse signifying PTSD  ROS- denies depressive symptoms   Social history- no drugs, nonsmoker, 7-10 drinks per week.  Past Medical History- Psych history (meds, every hospitalize, any dignosis) no No history hyperthyroidism  Lab Results  Component Value Date   TSH 1.97 01/14/2012  No history pheochromocytoma or hyperparathyroidism Low concern arrythmia  No history social phobia   The following were reviewed and entered/updated in epic: Past Medical History  Diagnosis Date  . Hemorrhoids   . Allergy   . IBS (irritable bowel syndrome)   . Anemia     history of   . Heartburn    Patient Active Problem List   Diagnosis Date Noted  . IBS 10/25/2009    Priority: Medium  . Allergic rhinitis 03/26/2013    Priority: Low  . Anemia 01/21/2012    Priority: Low  . Generalized anxiety disorder 03/15/2014   Past Surgical History  Procedure Laterality Date  . Laparoscopic assisted vaginal hysterectomy N/A 01/25/2013    Procedure:  LAPAROSCOPIC ASSISTED VAGINAL HYSTERECTOMY;  Surgeon: Olga Millers, MD;  Location: [facility];  Service: Gynecology;  Laterality: N/A;  . Bilateral salpingectomy Bilateral 01/25/2013    Procedure: BILATERAL SALPINGECTOMY;  Surgeon: Olga Millers, MD;  Location: [facility];  Service: Gynecology;  Laterality: Bilateral;  . Abdominal hysterectomy      still has ovaries    Family History  Problem Relation Age of Onset  . Hypertension Mother   . Hyperlipidemia Mother   . Hypertension Sister   . Heart disease Maternal Grandmother   . Diabetes Maternal Grandmother   . Heart disease Maternal Grandfather     Medications- reviewed and updated Current Outpatient Prescriptions  Medication Sig Dispense Refill  . vitamin C (ASCORBIC ACID) 500 MG tablet Take 500 mg by mouth daily.      . fluticasone (VERAMYST) 27.5 MCG/SPRAY nasal spray Place 2 sprays into the nose daily.      [name] Kitchen ibuprofen (ADVIL,MOTRIN) 200 MG tablet Take 600 mg by mouth every 4 (four) hours as needed for pain.      . ranitidine (ZANTAC) 150 MG tablet Take 150 mg by mouth daily as needed for heartburn.      . zolpidem (AMBIEN) 5 MG tablet TAKE 1 TABLET BY MOUTH AT BEDTIME AS NEEDED FOR SLEEP  30 tablet  5  . [DISCONTINUED] fluticasone (FLONASE) 50 MCG/ACT nasal spray Place 2 sprays into the nose daily.  16 g  6   Allergies-reviewed and updated No Known Allergies  History   Social History  . Marital Status: Single  Spouse Name: N/A    Number of Children: N/A  . Years of Education: N/A   Occupational History  . insurance    Social History Main Topics  . Smoking status: Never Smoker   . Smokeless tobacco: None  . Alcohol Use: 3.5 oz/week    7 drink(s) per week     Comment: socially  . Drug Use: No  . Sexual Activity: Yes    Birth Control/ Protection: Condom   Other Topics Concern  . None   Social History Narrative   Single. Lives alone. No kids.       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    [employer] grad. Degree in mass communications and public relations.       Hobbies: time with friends    ROS--See HPI   Objective: BP 132/88  Pulse 64  Temp(Src) 98.7 F (37.1 C)  Wt 162 lb (73.483 kg)  LMP 01/06/2013 Gen: NAD, resting comfortably Psych: calm today, not anxious appearing Neck: no thyromegaly CV: RRR no murmurs rubs or gallops Lungs: CTAB no crackles, wheeze, rhonchi Ext: no edema  Assessment/Plan:  Generalized anxiety disorder Gad7 >10 at 13. Will monitor future GAD7.  Therapy: Discussed CBT vs. Medication. Patient opts for: medication and exercise combination.  Follow up in 10-14 days to check safety, efficacy, tolerability (may be by [employer]). Discussed full effects 4 weeks past achieiving high end of dose. Minimum 12 months of therapy.  Exercise advised. Avoid caffeine, stimulants, nicotine.  Regular sleep advised. Ambien to help with this.   Return precautions advised.  2 week f/u visit or message. 6 weeks see in person.   Meds ordered this encounter  Medications  . zolpidem (AMBIEN) 5 MG tablet    Sig: TAKE 1 TABLET BY MOUTH AT BEDTIME AS NEEDED FOR SLEEP    Dispense:  30 tablet    Refill:  5  . citalopram (CELEXA) 20 MG tablet    Sig: Take 1 tablet (20 mg total) by mouth daily.    Dispense:  30 tablet    Refill:  1

## 2014-03-15 NOTE — Assessment & Plan Note (Signed)
Gad7 >10 at 13. Will monitor future GAD7.  Therapy: Discussed CBT vs. Medication. Patient opts for: medication and exercise combination.  Follow up in 10-14 days to check safety, efficacy, tolerability (may be by [employer]). Discussed full effects 4 weeks past achieiving high end of dose. Minimum 12 months of therapy.  Exercise advised. Avoid caffeine, stimulants, nicotine.  Regular sleep advised. Ambien to help with this.

## 2014-04-15 ENCOUNTER — Encounter: Payer: Self-pay | Admitting: Family Medicine

## 2014-04-15 ENCOUNTER — Ambulatory Visit (INDEPENDENT_AMBULATORY_CARE_PROVIDER_SITE_OTHER): Payer: BC Managed Care – PPO | Admitting: Family Medicine

## 2014-04-15 VITALS — BP 140/82 | Temp 98.8°F | Wt 161.0 lb

## 2014-04-15 DIAGNOSIS — H6980 Other specified disorders of Eustachian tube, unspecified ear: Secondary | ICD-10-CM

## 2014-04-15 DIAGNOSIS — J069 Acute upper respiratory infection, unspecified: Secondary | ICD-10-CM

## 2014-04-15 NOTE — Patient Instructions (Signed)
Eustachian Tube Dysfunction: There is a tube that connects between the sinuses and behind the ear called the "eustachian tube." Sometimes when you have allergies, a cold, or nasal congestion for any reason this tube can get blocked and pressure cannot equalize in your ears. (Like if you swim in deep water) This can also trap fluid behind the ear and give you a full, pressure-like sensation that is uncomfortable, but it is not an ear infection.  Recommendations: Afrin Nasal Spray: 2 sprays twice a day for a maximum of 3-4 days (longer than this and your nose gets addicted, and you have rebound swelling that makes it worse.) Sudafed: Either pseudoephedrine or phenylephrine. As directed on box. (Not is heart problems or high blood pressure) Anti-Histamine: Allegra, Zyrtec, or Claritin. All over the counter now and once a day.  Nasal steroid. Nasacort is over the counter now. About 10 prescription ones exist.  If you develop fever > 100.4, then things can change fluid behind the ear does increase your risk of developing an ear infection.  

## 2014-04-15 NOTE — Progress Notes (Signed)
[facility] [place], [place] Phone: [phone]  04/15/2014  Patient: Karla Howell, MRN: [id number], DOB: Aug 14, 1965, 48 y.o.  Primary Physician:  Garret Reddish, MD  Chief Complaint: head congestion and cough  Subjective:   Karla Howell is a 48 y.o. very pleasant female patient who presents with the following:  Sinus congestion, chest congestion, sinus headache. Stuffy head. Has been feeling bad for one week.  Pressure in her ears.   Patient has had nasal congestion, sinus congestion without significant frank sinus pain, some chest congestion, stuffy head, runny nose, and generally not felt all that well for 1 week.  She also has had some pressure in her ears.  Past Medical History, Surgical History, Social History, Family History, Problem List, Medications, and Allergies have been reviewed and updated if relevant.  ROS: GEN: Acute illness details above GI: Tolerating PO intake GU: maintaining adequate hydration and urination Pulm: No SOB Interactive and getting along well at home.  Otherwise, ROS is as per the HPI.   Objective:   BP 140/82 mmHg  Temp(Src) 98.8 F (37.1 C)  Wt 161 lb (73.029 kg)  LMP 01/06/2013   Gen: WDWN, NAD; A & O x3, cooperative. Pleasant.Globally Non-toxic HEENT: Normocephalic and atraumatic. Throat clear, w/o exudate, TM with serous fluid. rhinnorhea.  MMM Frontal sinuses: NT Max sinuses: NT NECK: Anterior cervical  LAD is absent CV: RRR, No M/G/R, cap refill <2 sec PULM: Breathing comfortably in no respiratory distress. no wheezing, crackles, rhonchi EXT: No c/c/e PSYCH: Friendly, good eye contact MSK: Nml gait     Laboratory and Imaging Data:  Assessment and Plan:   URI (upper respiratory infection)  Eustachian tube dysfunction, unspecified laterality    URI I discussed upper respiratory tract infections with the patient and explained viral  infections in general.  Recommended plenty of sleep. Symptomatic care with pushing fluids. Oral acetaminophen or NSAIDs as tolerated for body aches, chills, fevers.  follow-up if acutely worsens   Patient Instructions  Eustachian Tube Dysfunction: There is a tube that connects between the sinuses and behind the ear called the "eustachian tube." Sometimes when you have allergies, a cold, or nasal congestion for any reason this tube can get blocked and pressure cannot equalize in your ears. (Like if you swim in deep water) This can also trap fluid behind the ear and give you a full, pressure-like sensation that is uncomfortable, but it is not an ear infection.  Recommendations: Afrin Nasal Spray: 2 sprays twice a day for a maximum of 3-4 days (longer than this and your nose gets addicted, and you have rebound swelling that makes it worse.) Sudafed: Either pseudoephedrine or phenylephrine. As directed on box. (Not is heart problems or high blood pressure) Anti-Histamine: Allegra, Zyrtec, or Claritin. All over the counter now and once a day.  Nasal steroid. Nasacort is over the counter now. About 10 prescription ones exist.  If you develop fever > 100.4, then things can change fluid behind the ear does increase your risk of developing an ear infection.      Signed,  Maud Deed. Zurie Platas, MD   Patient's Medications  New Prescriptions   No medications on file  Previous Medications   CITALOPRAM (CELEXA) 20 MG TABLET    Take 1 tablet (20 mg total) by mouth daily.   FLUTICASONE (VERAMYST) 27.5 MCG/SPRAY NASAL SPRAY    Place 2 sprays into the nose daily.   IBUPROFEN (ADVIL,MOTRIN) 200 MG  TABLET    Take 600 mg by mouth every 4 (four) hours as needed for pain.   RANITIDINE (ZANTAC) 150 MG TABLET    Take 150 mg by mouth daily as needed for heartburn.   VITAMIN C (ASCORBIC ACID) 500 MG TABLET    Take 500 mg by mouth daily.   ZOLPIDEM (AMBIEN) 5 MG TABLET    TAKE 1 TABLET BY MOUTH AT BEDTIME AS  NEEDED FOR SLEEP  Modified Medications   No medications on file  Discontinued Medications   No medications on file

## 2014-04-28 ENCOUNTER — Ambulatory Visit (INDEPENDENT_AMBULATORY_CARE_PROVIDER_SITE_OTHER): Payer: BC Managed Care – PPO | Admitting: Family Medicine

## 2014-04-28 ENCOUNTER — Encounter: Payer: Self-pay | Admitting: Family Medicine

## 2014-04-28 VITALS — BP 138/92 | Temp 98.3°F | Wt 160.0 lb

## 2014-04-28 DIAGNOSIS — F411 Generalized anxiety disorder: Secondary | ICD-10-CM

## 2014-04-28 MED ORDER — CITALOPRAM HYDROBROMIDE 40 MG PO TABS: 40.0000 mg | ORAL_TABLET | Freq: Every day | ORAL | Status: DC

## 2014-04-28 MED ORDER — CITALOPRAM HYDROBROMIDE 40 MG PO TABS: 20.0000 mg | ORAL_TABLET | Freq: Every day | ORAL | Status: DC

## 2014-04-28 NOTE — Patient Instructions (Signed)
Increase Citalopram to 40mg  (take 2 of 20mg  until you run out).   I advise 150 minutes of exercise per week as this can be very helpful.   Let's check in 6-9 weeks to see how things are going.   Obviously see me before hand if side effects to great or if any thoughts of hurting yourself.  ______________________________________________________   Taking the medicine as directed and not missing any doses is one of the best things you can do to treat your Generalized Anxiety Disorder.  Here are some things to keep in mind:  1) Side effects (stomach upset, some increased anxiety) may happen before you notice a benefit.  These side effects typically go away over time. 2) Changes to your dose of medicine or a change in medication all together is sometimes necessary 3) Most people need to be on medication at least 6-12 months 4) Many people will notice an improvement within two weeks but the full effect of the medication can take up to 4-6 weeks 5) Stopping the medication when you start feeling better often results in a return of symptoms 6) If you start having thoughts of hurting yourself or others after starting this medicine, please call us immediately

## 2014-04-28 NOTE — Progress Notes (Signed)
  Garret Reddish, MD Phone: [phone]  Subjective:  Patient presents today to establish care with me as their new primary care provider. Patient was formerly a patient of Dr. Arnoldo Morale. Chief complaint-noted.   Anxiety/concern for Generalized Anxiety Disorder  Following up with recent start of celexa 20mg   Safety-No SI Tolerability-yawning more, slightly more drowsy Efficacy- see questions below GAD7  Still on the road a lot. Still no time exercise or eating well.   Family does seem to report less anxiety with getting reports in.   GAD 7 total: 14-unchanged 1. Feeling nervous, anxious or on edge: 3 says 2.9 (3) 2. Not being able to stop or control worrying: 2 from 2 3. Worrying too much about different things:0 4. Trouble relaxing: 3 from 3 5. Being so restless that it is hard to sit still:3 from 3 6. Becoming easily annoyed or irritable: 3 from 3 7. Feeling afraid as if something awful might happen:0 from 0  Patient denies history of smoking/nicotine, regular decongestants, albuterol no Denies traumatic events, abuse signifying PTSD. 2 cups coffee daily.  ROS- denies depressive symptoms   Past medical history :  Patient Active Problem List   Diagnosis Date Noted  . IBS 10/25/2009    Priority: Medium  . Allergic rhinitis 03/26/2013    Priority: Low  . Anemia 01/21/2012    Priority: Low  . Generalized anxiety disorder 03/15/2014   Medications- reviewed and updated Current Outpatient Prescriptions  Medication Sig Dispense Refill  . citalopram (CELEXA) 20 MG tablet daily 30 tablet 2  . fluticasone (VERAMYST) 27.5 MCG/SPRAY nasal spray Place 2 sprays into the nose daily.    . ranitidine (ZANTAC) 150 MG tablet Take 150 mg by mouth daily as needed for heartburn.    . vitamin C (ASCORBIC ACID) 500 MG tablet Take 500 mg by mouth daily.    [name] Kitchen zolpidem (AMBIEN) 5 MG tablet TAKE 1 TABLET BY MOUTH AT BEDTIME AS NEEDED FOR SLEEP 30 tablet 5  . ibuprofen (ADVIL,MOTRIN) 200 MG  tablet Take 600 mg by mouth every 4 (four) hours as needed for pain.     ROS--See HPI   Objective: BP 138/92 mmHg  Temp(Src) 98.3 F (36.8 C)  Wt 160 lb (72.576 kg)  LMP 01/06/2013 Gen: NAD, resting comfortably Psych: calm today, not anxious appearing Neck: no thyromegaly CV: RRR no murmurs rubs or gallops Lungs: CTAB no crackles, wheeze, rhonchi Ext: no edema  Assessment/Plan:  Generalized anxiety disorder GAD 7 unchanged at 14 from last time. We will increase Celexa to 40 mg. Follow-up 6-9 weeks. We could also consider cognitive behavioral therapy but this will be difficult with her schedule, alternate SSRI or BuSpar. Advised patient once again to exercise that she admits this will be difficult with her schedule.  Return precautions advised.   Meds ordered this encounter  Medications  . DISCONTD: citalopram (CELEXA) 40 MG tablet    Sig: Take 0.5 tablets (20 mg total) by mouth daily.    Dispense:  30 tablet    Refill:  2  . citalopram (CELEXA) 40 MG tablet    Sig: Take 1 tablet (40 mg total) by mouth daily.    Dispense:  30 tablet    Refill:  2

## 2014-04-28 NOTE — Assessment & Plan Note (Signed)
GAD 7 unchanged at 14 from last time. We will increase Celexa to 40 mg. Follow-up 6-9 weeks. We could also consider cognitive behavioral therapy but this will be difficult with her schedule, alternate SSRI or BuSpar. Advised patient once again to exercise that she admits this will be difficult with her schedule.

## 2014-05-13 ENCOUNTER — Encounter: Payer: Self-pay | Admitting: Family Medicine

## 2014-07-10 ENCOUNTER — Telehealth: Payer: Self-pay

## 2014-07-10 ENCOUNTER — Telehealth: Payer: Self-pay | Admitting: Family Medicine

## 2014-07-10 NOTE — Telephone Encounter (Signed)
Pt is wanting to wean off of citalopram (CELEXA) 40 MG tablet.  Pt states the 40 mg was too much to begin with, and starting taking 20 mg.  Pt has been taking 20 mg for 2 months.  Would like to know the next step. Does pt need appt to discuss or can dose be redused at this point? [facility] [address]

## 2014-07-10 NOTE — Telephone Encounter (Signed)
Spoke with pt and advised her of Dr. Yong Channel reccomendation then transferred her to scheduling to make appt.

## 2014-07-10 NOTE — Telephone Encounter (Signed)
Poorly controlled anxiety. i would not advise titrating off. If she wants to, would discuss at office visit  Per last note "GAD 7 unchanged at 14 from last time. We will increase Celexa to 40 mg. Follow-up 6-9 weeks. We could also consider cognitive behavioral therapy but this will be difficult with her schedule, alternate SSRI or BuSpar. Advised patient once again to exercise that she admits this will be difficult with her schedule."  Also discussed mychart message 05/13/14.

## 2014-07-10 NOTE — Telephone Encounter (Signed)
Pt was on hold to schedule an appt and states she got disconnected. Please call pt to schedule appt to f/u on anxiety per Dr. Yong Channel

## 2014-07-10 NOTE — Telephone Encounter (Signed)
Please advise 

## 2014-07-19 ENCOUNTER — Ambulatory Visit (INDEPENDENT_AMBULATORY_CARE_PROVIDER_SITE_OTHER): Payer: BLUE CROSS/BLUE SHIELD | Admitting: Family Medicine

## 2014-07-19 ENCOUNTER — Encounter: Payer: Self-pay | Admitting: Family Medicine

## 2014-07-19 VITALS — BP 138/90 | Temp 98.6°F | Wt 158.0 lb

## 2014-07-19 DIAGNOSIS — F411 Generalized anxiety disorder: Secondary | ICD-10-CM

## 2014-07-19 DIAGNOSIS — I10 Essential (primary) hypertension: Secondary | ICD-10-CM | POA: Insufficient documentation

## 2014-07-19 NOTE — Assessment & Plan Note (Signed)
DBP elevated x 2 meeting diagnosis. Discussed DASH diet and exercise, hopeful to avoid medication.

## 2014-07-19 NOTE — Progress Notes (Signed)
  Garret Reddish, MD Phone: [phone]  Subjective:   Karla Howell is a 49 y.o. year old very pleasant female patient who presents with the following:  GAD -improving control Celexa increased to 40mg  due to GAD 7 of 14 but patient did not tolerate due to sleepiness. Advised CBT, exercise-dificult with schedule  Patient had to go back to 20mg  of celexa. GAD7 of 7 today much improved. Patient states work has been much more calm and she is fearful that is primary reason for control and not the medication. Family does tend to think she is some less stressed.   ROS- No SI/HI. No weight gain on medication.   Hypertension-poor control diastolic  BP Readings from Last 3 Encounters:  07/19/14 138/90  04/28/14 138/92  04/15/14 140/82  Home BP monitoring-no Compliant with medications-no medications currently ROS-Denies any CP, HA, SOB, blurry vision, LE edema  Past Medical History- Patient Active Problem List   Diagnosis Date Noted  . Generalized anxiety disorder 03/15/2014    Priority: Medium  . IBS 10/25/2009    Priority: Medium  . Allergic rhinitis 03/26/2013    Priority: Low  . Anemia 01/21/2012    Priority: Low  . Essential hypertension 07/19/2014   Medications- reviewed and updated Current Outpatient Prescriptions  Medication Sig Dispense Refill  . citalopram (CELEXA) 40 MG tablet Take 1 tablet (40 mg total) by mouth daily. 30 tablet 2  . ranitidine (ZANTAC) 150 MG tablet Take 150 mg by mouth daily as needed for heartburn.    . vitamin C (ASCORBIC ACID) 500 MG tablet Take 500 mg by mouth daily.    . fluticasone (VERAMYST) 27.5 MCG/SPRAY nasal spray Place 2 sprays into the nose daily.    [name] Kitchen ibuprofen (ADVIL,MOTRIN) 200 MG tablet Take 600 mg by mouth every 4 (four) hours as needed for pain.    [name] Kitchen zolpidem (AMBIEN) 5 MG tablet TAKE 1 TABLET BY MOUTH AT BEDTIME AS NEEDED FOR SLEEP (Patient not taking: Reported on 07/19/2014) 30 tablet 5  . [DISCONTINUED] fluticasone (FLONASE)  50 MCG/ACT nasal spray Place 2 sprays into the nose daily. 16 g 6   No current facility-administered medications for this visit.    Objective: BP 138/90 mmHg  Temp(Src) 98.6 F (37 C)  Wt 158 lb (71.668 kg)  LMP 01/06/2013 Gen: NAD, resting comfortably in chair CV: RRR no murmurs rubs or gallops Lungs: CTAB no crackles, wheeze, rhonchi Ext: no edema   Assessment/Plan:  Generalized anxiety disorder Improved control on celexa 20mg  with GAD7 down to 7 from 14 but may be due to lower stressors at work-->change to lexapro and attempt titrate up if needed in future given did not tolerate 40mg  celexa due to sleepiness. Patient to update me within 6-8 weeks. Discussed did not think trialing off with poor control would be advisable.    Essential hypertension DBP elevated x 2 meeting diagnosis. Discussed DASH diet and exercise, hopeful to avoid medication.     Return precautions advised. 6 month in office planned. mychart message in 6-8 weeks.

## 2014-07-19 NOTE — Assessment & Plan Note (Signed)
Improved control on celexa 20mg  with GAD7 down to 7 from 14 but may be due to lower stressors at [facility] to lexapro and attempt titrate up if needed in future given did not tolerate 40mg  celexa due to sleepiness. Patient to update me within 6-8 weeks. Discussed did not think trialing off with poor control would be advisable.

## 2014-07-19 NOTE — Patient Instructions (Signed)
No changes today. GAD7 score of 7 with goal less than 5. Don't think we can tolerate the higher dose. Option would be switching to lexapro. Message me in 6-8 weeks after things have picked up and we can consider the change by mychart.   Blood pressure bottom number has been a hair high last 2 visits. Goal is <140/90 and ideally the closer to 120/80 the better. I recommend you continue to work on exercise and see eating advisements below. Great job on losing a few more pounds.   In office check in 6 months from now at least to check on blood pressure  Health Maintenance Due  Topic Date Due  . TETANUS/TDAP - recommend, refused today 11/27/1983  . PAP SMEAR -have your gyn send [place] a copy of your pap next time you see them 08/07/2012     DASH Eating Plan DASH stands for "Dietary Approaches to Stop Hypertension." The DASH eating plan is a healthy eating plan that has been shown to reduce high blood pressure (hypertension). Additional health benefits may include reducing the risk of type 2 diabetes mellitus, heart disease, and stroke. The DASH eating plan may also help with weight loss. WHAT DO I NEED TO KNOW ABOUT THE DASH EATING PLAN? For the DASH eating plan, you will follow these general guidelines:  Choose foods with a percent daily value for sodium of less than 5% (as listed on the food label).  Use salt-free seasonings or herbs instead of table salt or sea salt.  Check with your health care provider or pharmacist before using salt substitutes.  Eat lower-sodium products, often labeled as "lower sodium" or "no salt added."  Eat fresh foods.  Eat more vegetables, fruits, and low-fat dairy products.  Choose whole grains. Look for the word "whole" as the first word in the ingredient list.  Choose fish and skinless chicken or [place] more often than red meat. Limit fish, poultry, and meat to 6 oz (170 g) each day.  Limit sweets, desserts, sugars, and sugary drinks.  Choose heart-healthy  fats.  Limit cheese to 1 oz (28 g) per day.  Eat more home-cooked food and less restaurant, buffet, and fast food.  Limit fried foods.  Cook foods using methods other than frying.  Limit canned vegetables. If you do use them, rinse them well to decrease the sodium.  When eating at a restaurant, ask that your food be prepared with less salt, or no salt if possible. WHAT FOODS CAN I EAT? Seek help from a dietitian for individual calorie needs. Grains Whole grain or whole wheat bread. Brown rice. Whole grain or whole wheat pasta. Quinoa, bulgur, and whole grain cereals. Low-sodium cereals. Corn or whole wheat flour tortillas. Whole grain cornbread. Whole grain crackers. Low-sodium crackers. Vegetables Fresh or frozen vegetables (raw, steamed, roasted, or grilled). Low-sodium or reduced-sodium tomato and vegetable juices. Low-sodium or reduced-sodium tomato sauce and paste. Low-sodium or reduced-sodium canned vegetables.  Fruits All fresh, canned (in natural juice), or frozen fruits. Meat and Other Protein Products Ground beef (85% or leaner), grass-fed beef, or beef trimmed of fat. Skinless chicken or [place]. Ground chicken or [place]. Pork trimmed of fat. All fish and seafood. Eggs. Dried beans, peas, or lentils. Unsalted nuts and seeds. Unsalted canned beans. Dairy Low-fat dairy products, such as skim or 1% milk, 2% or reduced-fat cheeses, low-fat ricotta or cottage cheese, or plain low-fat yogurt. Low-sodium or reduced-sodium cheeses. Fats and Oils Tub margarines without trans fats. Light or reduced-fat mayonnaise and  salad dressings (reduced sodium). Avocado. Safflower, olive, or canola oils. Natural peanut or almond butter. Other Unsalted popcorn and pretzels. The items listed above may not be a complete list of recommended foods or beverages. Contact your dietitian for more options. WHAT FOODS ARE NOT RECOMMENDED? Grains White bread. White pasta. White rice. Refined cornbread.  Bagels and croissants. Crackers that contain trans fat. Vegetables Creamed or fried vegetables. Vegetables in a cheese sauce. Regular canned vegetables. Regular canned tomato sauce and paste. Regular tomato and vegetable juices. Fruits Dried fruits. Canned fruit in light or heavy syrup. Fruit juice. Meat and Other Protein Products Fatty cuts of meat. Ribs, chicken wings, bacon, sausage, bologna, salami, chitterlings, fatback, hot dogs, bratwurst, and packaged luncheon meats. Salted nuts and seeds. Canned beans with salt. Dairy Whole or 2% milk, cream, half-and-half, and cream cheese. Whole-fat or sweetened yogurt. Full-fat cheeses or blue cheese. Nondairy creamers and whipped toppings. Processed cheese, cheese spreads, or cheese curds. Condiments Onion and garlic salt, seasoned salt, table salt, and sea salt. Canned and packaged gravies. Worcestershire sauce. Tartar sauce. Barbecue sauce. Teriyaki sauce. Soy sauce, including reduced sodium. Steak sauce. Fish sauce. Oyster sauce. Cocktail sauce. Horseradish. Ketchup and mustard. Meat flavorings and tenderizers. Bouillon cubes. Hot sauce. Tabasco sauce. Marinades. Taco seasonings. Relishes. Fats and Oils Butter, stick margarine, lard, shortening, ghee, and bacon fat. Coconut, palm kernel, or palm oils. Regular salad dressings. Other Pickles and olives. Salted popcorn and pretzels. The items listed above may not be a complete list of foods and beverages to avoid. Contact your dietitian for more information. WHERE CAN I FIND MORE INFORMATION? National Heart, Lung, and Blood Institute: travelstabloid.com Document Released: 05/15/2011 Document Revised: 10/10/2013 Document Reviewed: 03/30/2013 [facility] Patient Information 2015 [place], [place]. This information is not intended to replace advice given to you by your health care provider. Make sure you discuss any questions you have with your health care provider.

## 2014-09-10 ENCOUNTER — Other Ambulatory Visit: Payer: Self-pay | Admitting: Family Medicine

## 2014-10-22 ENCOUNTER — Other Ambulatory Visit: Payer: Self-pay | Admitting: Family Medicine

## 2014-11-11 ENCOUNTER — Encounter: Payer: Self-pay | Admitting: Family Medicine

## 2014-11-11 ENCOUNTER — Ambulatory Visit (INDEPENDENT_AMBULATORY_CARE_PROVIDER_SITE_OTHER): Payer: BLUE CROSS/BLUE SHIELD | Admitting: Family Medicine

## 2014-11-11 VITALS — BP 130/86 | HR 72 | Temp 98.5°F | Wt 161.0 lb

## 2014-11-11 DIAGNOSIS — J0100 Acute maxillary sinusitis, unspecified: Secondary | ICD-10-CM | POA: Diagnosis not present

## 2014-11-11 MED ORDER — HYDROCODONE-HOMATROPINE 5-1.5 MG/5ML PO SYRP: 5.0000 mL | ORAL_SOLUTION | ORAL | Status: DC | PRN

## 2014-11-11 MED ORDER — AMOXICILLIN-POT CLAVULANATE 875-125 MG PO TABS: 1.0000 | ORAL_TABLET | Freq: Two times a day (BID) | ORAL | Status: DC

## 2014-11-11 NOTE — Progress Notes (Signed)
Pre visit review using our clinic review tool, if applicable. No additional management support is needed unless otherwise documented below in the visit note. 

## 2014-11-11 NOTE — Progress Notes (Signed)
   Subjective:    Patient ID: Karla Howell, female    DOB: 1966-03-22, 49 y.o.   MRN: [id number]  HPI Here for one week of sonus pressure, PND, HA, and a dry cough. On Mucinex D.   Review of Systems  Constitutional: Negative.   HENT: Positive for congestion, postnasal drip and sinus pressure.   Eyes: Negative.   Respiratory: Positive for cough and chest tightness.        Objective:   Physical Exam  Constitutional: She appears well-developed and well-nourished.  HENT:  Right Ear: External ear normal.  Left Ear: External ear normal.  Nose: Nose normal.  Mouth/Throat: Oropharynx is clear and moist.  Eyes: Conjunctivae are normal.  Pulmonary/Chest: Effort normal and breath sounds normal.  Lymphadenopathy:    She has no cervical adenopathy.          Assessment & Plan:  Sinusitis. Treat with Augmentin.

## 2014-12-26 ENCOUNTER — Telehealth: Payer: Self-pay | Admitting: Family Medicine

## 2014-12-26 NOTE — Telephone Encounter (Signed)
I thought we were considering a change to lexapro given continued anxiety issues but didn't tolerate higher dose of citalopram. She was going to let me know in 6-8 weeks after last visit.

## 2014-12-26 NOTE — Telephone Encounter (Signed)
Pt would like to taper off on citalopram. Please advise

## 2014-12-26 NOTE — Telephone Encounter (Signed)
See below

## 2014-12-27 NOTE — Telephone Encounter (Signed)
Called and left message for pt tcb 

## 2014-12-27 NOTE — Telephone Encounter (Signed)
Appreciate the update Keba. I believe patient is taking 20mg  currently as 1/2 of 40mg  tab. If this is the case, call her in #30 of 20mg  citalopram. Have her take 1 tab for 2 weeks and then 1/2 tab for another 2 weeks.   We are trying to avoid the following symptoms: Agitation or irritability, anxiety, chills, sweating, dizziness, fatigue, headache, poor sleep, muscle aches, nausea, runny nose, shakiness, tingling in hands or feet. These can happen if you stop abruptly so slowly coming off should help as listed above.   IF she has new or worsening symptoms, have her come see [place].

## 2014-12-27 NOTE — Telephone Encounter (Signed)
Pt states she is doing a lot better and she does not think she is where she was and she would like to taper off Citalopram and does not want to try anything else, she does not want to be on any medication. She states she has cut her hours down at work, she is exercising more and getting more sleep so she would like to see how these factor out for her before trying anything new.

## 2014-12-28 MED ORDER — CITALOPRAM HYDROBROMIDE 20 MG PO TABS: 20.0000 mg | ORAL_TABLET | Freq: Every day | ORAL | Status: DC

## 2014-12-28 NOTE — Addendum Note (Signed)
Addended by: Clyde Lundborg A on: 12/28/2014 08:58 AM   Modules accepted: Orders

## 2014-12-28 NOTE — Telephone Encounter (Signed)
Pt returned call and notified.

## 2015-02-14 ENCOUNTER — Ambulatory Visit: Payer: BLUE CROSS/BLUE SHIELD | Admitting: Podiatry

## 2015-04-26 ENCOUNTER — Telehealth: Payer: Self-pay | Admitting: Family Medicine

## 2015-04-26 MED ORDER — ZOLPIDEM TARTRATE 5 MG PO TABS: ORAL_TABLET | ORAL | Status: DC

## 2015-04-26 NOTE — Telephone Encounter (Signed)
Medication called in 

## 2015-04-26 NOTE — Telephone Encounter (Signed)
Pt needs refill on generic ambien 5 mg # 30 w/refills sent to [address]

## 2015-04-26 NOTE — Telephone Encounter (Signed)
Refill ok? 

## 2015-04-26 NOTE — Telephone Encounter (Signed)
Yes thanks ok to fill but would also like to see her sometime soon to see how things are going with anxiety

## 2015-08-08 LAB — HM COLONOSCOPY

## 2015-08-09 ENCOUNTER — Encounter: Payer: Self-pay | Admitting: Gastroenterology

## 2015-09-18 ENCOUNTER — Ambulatory Visit (AMBULATORY_SURGERY_CENTER): Payer: Self-pay | Admitting: *Deleted

## 2015-09-18 VITALS — Ht 63.5 in | Wt 170.0 lb

## 2015-09-18 DIAGNOSIS — Z1211 Encounter for screening for malignant neoplasm of colon: Secondary | ICD-10-CM

## 2015-09-18 MED ORDER — NA SULFATE-K SULFATE-MG SULF 17.5-3.13-1.6 GM/177ML PO SOLN: 1.0000 | Freq: Once | ORAL | Status: DC

## 2015-09-18 NOTE — Progress Notes (Signed)
No egg or soy allergy known to patient  No issues with past sedation with any surgeries  or procedures, no intubation problems  No diet pills per patient No home 02 use per patient  No blood thinners per patient  Pt states  issues with constipation on and off- will use prune juice and this helps

## 2015-10-02 ENCOUNTER — Ambulatory Visit (AMBULATORY_SURGERY_CENTER): Payer: 59 | Admitting: Gastroenterology

## 2015-10-02 ENCOUNTER — Encounter: Payer: Self-pay | Admitting: Gastroenterology

## 2015-10-02 VITALS — BP 126/84 | HR 73 | Temp 97.3°F | Resp 13 | Ht 63.5 in | Wt 170.0 lb

## 2015-10-02 DIAGNOSIS — Z1211 Encounter for screening for malignant neoplasm of colon: Secondary | ICD-10-CM

## 2015-10-02 DIAGNOSIS — K635 Polyp of colon: Secondary | ICD-10-CM | POA: Diagnosis not present

## 2015-10-02 DIAGNOSIS — D123 Benign neoplasm of transverse colon: Secondary | ICD-10-CM

## 2015-10-02 MED ORDER — SODIUM CHLORIDE 0.9 % IV SOLN: 500.0000 mL | INTRAVENOUS | Status: DC

## 2015-10-02 NOTE — Op Note (Signed)
[facility] Patient Name: Karla Howell Procedure Date: 10/02/2015 10:06 AM MRN: [id number] Endoscopist: Mauri Pole , MD Age: 50 Date of Birth: 01/22/1966 Gender: Female Procedure:                Colonoscopy Indications:              Screening for colorectal malignant neoplasm Medicines:                Monitored Anesthesia Care Procedure:                Pre-Anesthesia Assessment:                           - Prior to the procedure, a History and Physical                            was performed, and patient medications and                            allergies were reviewed. The patient's tolerance of                            previous anesthesia was also reviewed. The risks                            and benefits of the procedure and the sedation                            options and risks were discussed with the patient.                            All questions were answered, and informed consent                            was obtained. Prior Anticoagulants: The patient has                            taken no previous anticoagulant or antiplatelet                            agents. ASA Grade Assessment: I - A normal, healthy                            patient. After reviewing the risks and benefits,                            the patient was deemed in satisfactory condition to                            undergo the procedure.                           After obtaining informed consent, the colonoscope  was passed under direct vision. Throughout the                            procedure, the patient's blood pressure, pulse, and                            oxygen saturations were monitored continuously. The                            Model CF-HQ190L [id number]) scope was introduced                            through the anus and advanced to the the terminal                            ileum, with identification of the appendiceal                   orifice and IC valve. The colonoscopy was performed                            without difficulty. The patient tolerated the                            procedure well. The quality of the bowel                            preparation was excellent. The terminal ileum,                            ileocecal valve, appendiceal orifice, and rectum                            were photographed. Scope In: 10:18:32 AM Scope Out: 10:29:25 AM Scope Withdrawal Time: 0 hours 5 minutes 54 seconds  Total Procedure Duration: 0 hours 10 minutes 53 seconds  Findings:                 The perianal and digital rectal examinations were                            normal. Pertinent negatives include normal                            sphincter tone.                           Multiple small and large-mouthed diverticula were                            found in the sigmoid colon, descending colon and                            transverse colon. There was no evidence of  diverticular bleeding.                           A 7 mm polyp was found in the transverse colon. The                            polyp was sessile. The polyp was removed with a                            cold snare. Resection and retrieval were complete.                           Non-bleeding internal hemorrhoids were found during                            retroflexion. The hemorrhoids were small. Complications:            No immediate complications. Estimated Blood Loss:     Estimated blood loss: none. Impression:               - Moderate diverticulosis in the sigmoid colon, in                            the descending colon and in the transverse colon.                            There was no evidence of diverticular bleeding.                           - One 7 mm polyp in the transverse colon, removed                            with a cold snare. Resected and retrieved.                           -  Non-bleeding internal hemorrhoids. Recommendation:           - Patient has a contact number available for                            emergencies. The signs and symptoms of potential                            delayed complications were discussed with the                            patient. Return to normal activities tomorrow.                            Written discharge instructions were provided to the                            patient.                           -  Resume previous diet.                           - Continue present medications.                           - Await pathology results.                           - Repeat colonoscopy in 5-10 years for surveillance.                           - Return to GI clinic PRN. Mauri Pole, MD 10/02/2015 10:36:13 AM This report has been signed electronically.

## 2015-10-02 NOTE — Progress Notes (Signed)
To recovery, report to Brown, RN, VSS. 

## 2015-10-02 NOTE — Patient Instructions (Signed)
YOU HAD AN ENDOSCOPIC PROCEDURE TODAY AT [facility]:   Refer to the procedure report that was given to you for any specific questions about what was found during the examination.  If the procedure report does not answer your questions, please call your gastroenterologist to clarify.  If you requested that your care partner not be given the details of your procedure findings, then the procedure report has been included in a sealed envelope for you to review at your convenience later.  YOU SHOULD EXPECT: Some feelings of bloating in the abdomen. Passage of more gas than usual.  Walking can help get rid of the air that was put into your GI tract during the procedure and reduce the bloating. If you had a lower endoscopy (such as a colonoscopy or flexible sigmoidoscopy) you may notice spotting of blood in your stool or on the toilet paper. If you underwent a bowel prep for your procedure, you may not have a normal bowel movement for a few days.  Please Note:  You might notice some irritation and congestion in your nose or some drainage.  This is from the oxygen used during your procedure.  There is no need for concern and it should clear up in a day or so.  SYMPTOMS TO REPORT IMMEDIATELY:   Following lower endoscopy (colonoscopy or flexible sigmoidoscopy):  Excessive amounts of blood in the stool  Significant tenderness or worsening of abdominal pains  Swelling of the abdomen that is new, acute  Fever of 100F or higher    For urgent or emergent issues, a gastroenterologist can be reached at any hour by calling [phone].   DIET: Your first meal following the procedure should be a small meal and then it is ok to progress to your normal diet. Heavy or fried foods are harder to digest and may make you feel nauseous or bloated.  Likewise, meals heavy in dairy and vegetables can increase bloating.  Drink plenty of fluids but you should avoid alcoholic beverages for 24  hours.  ACTIVITY:  You should plan to take it easy for the rest of today and you should NOT DRIVE or use heavy machinery until tomorrow (because of the sedation medicines used during the test).    FOLLOW UP: Our staff will call the number listed on your records the next business day following your procedure to check on you and address any questions or concerns that you may have regarding the information given to you following your procedure. If we do not reach you, we will leave a message.  However, if you are feeling well and you are not experiencing any problems, there is no need to return our call.  We will assume that you have returned to your regular daily activities without incident.  If any biopsies were taken you will be contacted by phone or by letter within the next 1-3 weeks.  Please call us at [phone] if you have not heard about the biopsies in 3 weeks.    SIGNATURES/CONFIDENTIALITY: You and/or your care partner have signed paperwork which will be entered into your electronic medical record.  These signatures attest to the fact that that the information above on your After Visit Summary has been reviewed and is understood.  Full responsibility of the confidentiality of this discharge information lies with you and/or your care-partner.   Resume medications. Information given on polyps,diverticulosis,hemorrhoids,banding and high fiber diet.

## 2015-10-02 NOTE — Progress Notes (Signed)
Called to room to assist during endoscopic procedure.  Patient ID and intended procedure confirmed with present staff. Received instructions for my participation in the procedure from the performing physician.  

## 2015-10-03 ENCOUNTER — Telehealth: Payer: Self-pay | Admitting: *Deleted

## 2015-10-03 NOTE — Telephone Encounter (Signed)
  Follow up Call-  Call back number 10/02/2015  Post procedure Call Back phone  # [phone]  Permission to leave phone message Yes     Patient questions:  Do you have a fever, pain , or abdominal swelling? No. Pain Score  0 *  Have you tolerated food without any problems? Yes.    Have you been able to return to your normal activities? Yes.    Do you have any questions about your discharge instructions: Diet   No. Medications  No. Follow up visit  No.  Do you have questions or concerns about your Care? No.  Actions: * If pain score is 4 or above: No action needed, pain <4.

## 2015-10-17 ENCOUNTER — Encounter: Payer: Self-pay | Admitting: Gastroenterology

## 2016-02-08 ENCOUNTER — Encounter: Payer: Self-pay | Admitting: Family Medicine

## 2016-02-08 ENCOUNTER — Ambulatory Visit (INDEPENDENT_AMBULATORY_CARE_PROVIDER_SITE_OTHER): Payer: 59 | Admitting: Family Medicine

## 2016-02-08 VITALS — BP 122/82 | HR 89 | Temp 97.9°F | Wt 166.0 lb

## 2016-02-08 DIAGNOSIS — R052 Subacute cough: Secondary | ICD-10-CM

## 2016-02-08 DIAGNOSIS — R05 Cough: Secondary | ICD-10-CM | POA: Diagnosis not present

## 2016-02-08 MED ORDER — ZOLPIDEM TARTRATE 5 MG PO TABS: ORAL_TABLET | ORAL | 5 refills | Status: DC

## 2016-02-08 MED ORDER — PREDNISONE 20 MG PO TABS: ORAL_TABLET | ORAL | 0 refills | Status: DC

## 2016-02-08 NOTE — Progress Notes (Signed)
Subjective:  Karla Howell is a 50 y.o. year old very pleasant female patient who presents for/with See problem oriented charting ROS- no fever, chills, nausea, vomiting, shortness of breath.see any ROS included in HPI as well.   Past Medical History-  Patient Active Problem List   Diagnosis Date Noted  . Generalized anxiety disorder 03/15/2014    Priority: Medium  . IBS 10/25/2009    Priority: Medium  . Allergic rhinitis 03/26/2013    Priority: Low  . Anemia 01/21/2012    Priority: Low  . Essential hypertension 07/19/2014    Medications- reviewed and updated Current Outpatient Prescriptions  Medication Sig Dispense Refill  . ibuprofen (ADVIL,MOTRIN) 200 MG tablet Take 600 mg by mouth every 4 (four) hours as needed for pain. Reported on 10/02/2015    . Multiple Vitamin (MULTIVITAMIN) tablet Take 1 tablet by mouth daily.    . ranitidine (ZANTAC) 150 MG tablet Take 150 mg by mouth daily as needed for heartburn. Reported on 10/02/2015    . vitamin C (ASCORBIC ACID) 500 MG tablet Take 500 mg by mouth daily. Reported on 10/02/2015    . zolpidem (AMBIEN) 5 MG tablet TAKE 1 TABLET BY MOUTH AT BEDTIME AS NEEDED FOR SLEEP 30 tablet 5   No current facility-administered medications for this visit.     Objective: BP 122/82   Pulse 89   Temp 97.9 F (36.6 C) (Oral)   Wt 166 lb (75.3 kg)   LMP 01/06/2013   SpO2 95%   BMI 28.94 kg/m  Gen: NAD, resting comfortably, well appearing Mild erythema in pharynx with signs of drainage/irritation. Nares normal. Oral cavity normal. TM normal except mild cerumen CV: RRR no murmurs rubs or gallops Lungs: CTAB no crackles, wheeze, rhonchi Abdomen: soft/nontender/nondistended/normal bowel sounds. No rebound or guarding.  Ext: no edema Skin: warm, dry  Assessment/Plan:  Subacute cough S: Patient with cough/cold/congestion in late July. This ended within a week or so but nonproductive cough has persisted. Has been slightly better over last day  or two. She also feels even more fatigued than her baseline level of fatigue- cough seems to be running her down. Tried claritin for last 2 weeks without clear improvement in cough. No one else sick around her for this period of time. Wanted eval for PNA or bronchitis A/P: no obvious PNA or bronchitis on exam. We discussed walking pna possible- discussed CXR_ declines for now. We discussed four a's of chronic cough none of which seemed to fit her (ace-I not on, no asthma history, allergies possible but claritin not helping, GERD possible- is on antihistamine though). We will treat for postviral cough with course of prednsione and get x-ray In 2 weeks if symptoms not resolved   Meds ordered this encounter  Medications  . zolpidem (AMBIEN) 5 MG tablet    Sig: TAKE 1 TABLET BY MOUTH AT BEDTIME AS NEEDED FOR SLEEP    Dispense:  30 tablet    Refill:  5  . predniSONE (DELTASONE) 20 MG tablet    Sig: Take 2 pills for 2 days, 1 pill for 5 days    Dispense:  9 tablet    Refill:  0    Return precautions advised.  Garret Reddish, MD

## 2016-02-08 NOTE — Patient Instructions (Signed)
Suspect post viral cough syndrome- treat with prednisone for 7 days  Update me in 2 weeks if persists- can do chest x-ray- do not have to come back for visit unless change in symptoms or persistent past x-ray and no clear explanation

## 2016-02-08 NOTE — Progress Notes (Signed)
Pre visit review using our clinic review tool, if applicable. No additional management support is needed unless otherwise documented below in the visit note. 

## 2016-03-06 LAB — HM MAMMOGRAPHY

## 2016-04-04 ENCOUNTER — Other Ambulatory Visit (INDEPENDENT_AMBULATORY_CARE_PROVIDER_SITE_OTHER): Payer: 59

## 2016-04-04 DIAGNOSIS — R319 Hematuria, unspecified: Secondary | ICD-10-CM

## 2016-04-04 DIAGNOSIS — Z0000 Encounter for general adult medical examination without abnormal findings: Secondary | ICD-10-CM

## 2016-04-04 LAB — CBC WITH DIFFERENTIAL/PLATELET
BASOS ABS: 0 10*3/uL (ref 0.0–0.1)
BASOS PCT: 0.4 % (ref 0.0–3.0)
EOS ABS: 0.1 10*3/uL (ref 0.0–0.7)
Eosinophils Relative: 3.3 % (ref 0.0–5.0)
HEMATOCRIT: 37.8 % (ref 36.0–46.0)
Hemoglobin: 12.5 g/dL (ref 12.0–15.0)
LYMPHS ABS: 1.6 10*3/uL (ref 0.7–4.0)
Lymphocytes Relative: 34.6 % (ref 12.0–46.0)
MCHC: 33.1 g/dL (ref 30.0–36.0)
MCV: 88.3 fl (ref 78.0–100.0)
MONOS PCT: 9 % (ref 3.0–12.0)
Monocytes Absolute: 0.4 10*3/uL (ref 0.1–1.0)
NEUTROS ABS: 2.4 10*3/uL (ref 1.4–7.7)
NEUTROS PCT: 52.7 % (ref 43.0–77.0)
PLATELETS: 324 10*3/uL (ref 150.0–400.0)
RBC: 4.28 Mil/uL (ref 3.87–5.11)
RDW: 12.7 % (ref 11.5–15.5)
WBC: 4.5 10*3/uL (ref 4.0–10.5)

## 2016-04-04 LAB — HEPATIC FUNCTION PANEL
ALBUMIN: 4 g/dL (ref 3.5–5.2)
ALK PHOS: 61 U/L (ref 39–117)
ALT: 12 U/L (ref 0–35)
AST: 15 U/L (ref 0–37)
Bilirubin, Direct: 0.1 mg/dL (ref 0.0–0.3)
TOTAL PROTEIN: 7.6 g/dL (ref 6.0–8.3)
Total Bilirubin: 0.5 mg/dL (ref 0.2–1.2)

## 2016-04-04 LAB — URINALYSIS, MICROSCOPIC ONLY

## 2016-04-04 LAB — POC URINALSYSI DIPSTICK (AUTOMATED)
BILIRUBIN UA: NEGATIVE
GLUCOSE UA: NEGATIVE
Ketones, UA: NEGATIVE
Nitrite, UA: NEGATIVE
PH UA: 7
SPEC GRAV UA: 1.015
Urobilinogen, UA: 0.2

## 2016-04-04 LAB — LIPID PANEL
Cholesterol: 146 mg/dL (ref 0–200)
HDL: 47.7 mg/dL (ref >39.00)
LDL Cholesterol: 80 mg/dL (ref 0–99)
NONHDL: 97.8
TRIGLYCERIDES: 91 mg/dL (ref 0.0–149.0)
Total CHOL/HDL Ratio: 3
VLDL: 18.2 mg/dL (ref 0.0–40.0)

## 2016-04-04 LAB — BASIC METABOLIC PANEL
BUN: 12 mg/dL (ref 6–23)
CHLORIDE: 105 meq/L (ref 96–112)
CO2: 30 meq/L (ref 19–32)
CREATININE: 1.08 mg/dL (ref 0.40–1.20)
Calcium: 9.2 mg/dL (ref 8.4–10.5)
GFR: 68.69 mL/min (ref >60.00)
GLUCOSE: 92 mg/dL (ref 70–99)
POTASSIUM: 4.1 meq/L (ref 3.5–5.1)
Sodium: 140 mEq/L (ref 135–145)

## 2016-04-07 LAB — TSH: TSH: 0.92 u[IU]/mL (ref 0.35–4.50)

## 2016-04-11 ENCOUNTER — Encounter: Payer: Self-pay | Admitting: Family Medicine

## 2016-04-11 ENCOUNTER — Ambulatory Visit (INDEPENDENT_AMBULATORY_CARE_PROVIDER_SITE_OTHER): Payer: 59 | Admitting: Family Medicine

## 2016-04-11 VITALS — BP 122/80 | HR 93 | Temp 98.2°F | Ht 63.25 in | Wt 164.2 lb

## 2016-04-11 DIAGNOSIS — Z0000 Encounter for general adult medical examination without abnormal findings: Secondary | ICD-10-CM

## 2016-04-11 DIAGNOSIS — Z23 Encounter for immunization: Secondary | ICD-10-CM

## 2016-04-11 NOTE — Progress Notes (Signed)
Pre visit review using our clinic review tool, if applicable. No additional management support is needed unless otherwise documented below in the visit note. 

## 2016-04-11 NOTE — Progress Notes (Signed)
Phone: [phone]  Subjective:  Patient presents today for their annual physical. Chief complaint-noted.   See problem oriented charting- ROS- full  review of systems was completed and negative including No chest pain or shortness of breath. No headache or blurry vision.   The following were reviewed and entered/updated in epic: Past Medical History:  Diagnosis Date  . Allergy   . Anemia    history of   . GERD (gastroesophageal reflux disease)   . Heartburn   . Hemorrhoids   . IBS (irritable bowel syndrome)    Patient Active Problem List   Diagnosis Date Noted  . Generalized anxiety disorder 03/15/2014    Priority: Medium  . IBS 10/25/2009    Priority: Medium  . Allergic rhinitis 03/26/2013    Priority: Low  . Anemia 01/21/2012    Priority: Low  . Essential hypertension 07/19/2014   Past Surgical History:  Procedure Laterality Date  . BILATERAL SALPINGECTOMY Bilateral 01/25/2013   Procedure: BILATERAL SALPINGECTOMY;  Surgeon: Olga Millers, MD;  Location: [facility];  Service: Gynecology;  Laterality: Bilateral;  . LAPAROSCOPIC ASSISTED VAGINAL HYSTERECTOMY N/A 01/25/2013   Procedure: LAPAROSCOPIC ASSISTED VAGINAL HYSTERECTOMY;  Surgeon: Olga Millers, MD;  Location: [facility];  Service: Gynecology;  Laterality: N/A;  . thumb surgery Left     Family History  Problem Relation Age of Onset  . Hypertension Mother   . Hyperlipidemia Mother   . Colon polyps Mother   . Hypertension Sister   . Heart disease Maternal Grandmother   . Diabetes Maternal Grandmother   . Heart disease Maternal Grandfather   . Colon cancer Neg Hx   . Esophageal cancer Neg Hx   . Rectal cancer Neg Hx   . Stomach cancer Neg Hx     Medications- reviewed and updated Current Outpatient Prescriptions  Medication Sig Dispense Refill  . citalopram (CELEXA) 20 MG tablet Take 20 mg by mouth daily.    . Multiple Vitamin (MULTIVITAMIN) tablet Take 1 tablet by mouth daily.    . ranitidine (ZANTAC) 150  MG tablet Take 150 mg by mouth daily as needed for heartburn. Reported on 10/02/2015    . vitamin C (ASCORBIC ACID) 500 MG tablet Take 500 mg by mouth daily. Reported on 10/02/2015    . zolpidem (AMBIEN) 5 MG tablet TAKE 1 TABLET BY MOUTH AT BEDTIME AS NEEDED FOR SLEEP 30 tablet 5   No current facility-administered medications for this visit.     Allergies-reviewed and updated No Known Allergies  Social History   Social History  . Marital status: Single    Spouse name: N/A  . Number of children: N/A  . Years of education: N/A   Occupational History  . insurance    Social History Main Topics  . Smoking status: Never Smoker  . Smokeless tobacco: Never Used  . Alcohol use 4.2 oz/week    7 Standard drinks or equivalent per week     Comment: socially  . Drug use: No  . Sexual activity: Yes    Birth control/ protection: Condom   Other Topics Concern  . None   Social History Narrative   Single. Lives alone. No kids.       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    [employer] grad. Degree in mass communications and public relations.       Hobbies: time with friends    Objective: BP 122/80 (BP Location: Left Arm, Patient Position: Sitting, Cuff Size: Normal)  Pulse 93   Temp 98.2 F (36.8 C) (Oral)   Ht 5' 3.25" (1.607 m)   Wt 164 lb 3.2 oz (74.5 kg)   LMP 01/06/2013   SpO2 96%   BMI 28.86 kg/m  Gen: NAD, resting comfortably HEENT: Mucous membranes are moist. Oropharynx normal Neck: no thyromegaly CV: RRR no murmurs rubs or gallops Lungs: CTAB no crackles, wheeze, rhonchi Abdomen: soft/nontender/nondistended/normal bowel sounds. No rebound or guarding.  Ext: no edema Skin: warm, dry Neuro: grossly normal, moves all extremities, PERRLA  Assessment/Plan:  50 y.o. female presenting for annual physical.  Health Maintenance counseling: 1. Anticipatory guidance: Patient counseled regarding regular dental exams, eye exams, wearing seatbelts.   2. Risk factor reduction:  Advised patient of need for regular exercise and diet rich and fruits and vegetables to reduce risk of heart attack and stroke. Doing a 5k with her church tomorrow- does her best when shes home but travel is tough. 6 lbs down from April.  3. Immunizations/screenings/ancillary studies- declines flu. Tdap today  Health Maintenance Due  Topic Date Due  . HIV Screening - in future 11/27/1979  . TETANUS/TDAP - today 11/27/1983  . PAP SMEAR - get records 08/07/2012  . MAMMOGRAM - get records 11/27/2014  . INFLUENZA VACCINE - decline 01/08/2016   4. Cervical cancer screening- get records done 02/2016 5. Breast cancer screening-  breast exam with GYN and mammogram 02/2016 6. Colon cancer screening - 10/02/15 with 10 year follow up  Status of chronic or acute concerns  Anxiety-Restarted celexa 20mg  through gynecology- I am willing to refill  Some mild fatigue probably due to poor sleep. Wakes up 3-4 even with ambien. I am willing to refill ambien over next year  Interested in Vit D and B12 and HIV possibly at follow up physical  Return in about 1 year (around 04/11/2017) for physical.  Meds ordered this encounter  Medications  . citalopram (CELEXA) 20 MG tablet    Sig: Take 20 mg by mouth daily.    Return precautions advised.   Garret Reddish, MD

## 2016-04-11 NOTE — Patient Instructions (Addendum)
Sign release of information at the check out desk for pap smear and mammogram with Dr. Stann Mainland from September  I am willing to refill celexa as long as doing well and no thoughts of hurting herself.   Consider Vitamin D, b12, hiv with next labs  Tdap today

## 2016-04-18 ENCOUNTER — Encounter: Payer: Self-pay | Admitting: Family Medicine

## 2016-09-27 ENCOUNTER — Ambulatory Visit (INDEPENDENT_AMBULATORY_CARE_PROVIDER_SITE_OTHER): Payer: 59 | Admitting: Internal Medicine

## 2016-09-27 ENCOUNTER — Encounter: Payer: Self-pay | Admitting: Internal Medicine

## 2016-09-27 VITALS — BP 128/84 | HR 96 | Temp 98.5°F | Wt 167.0 lb

## 2016-09-27 DIAGNOSIS — J0100 Acute maxillary sinusitis, unspecified: Secondary | ICD-10-CM | POA: Diagnosis not present

## 2016-09-27 NOTE — Progress Notes (Signed)
Subjective:    Patient ID: Karla Howell, female    DOB: Jun 04, 1966, 51 y.o.   MRN: [id number]  HPI Here due to sinus symptoms  Has known allergies Now have facial pressure, congestion, bad cough Yellow nasal discharge Cough seems productive No fever Some chills this week--no night sweats Started 3 days ago  No ear pain Did fly a week ago--left ear seemed stopped up after that (notices it with swallowing) Some sore throat  Has tried mucinex sinus and cold Now using flonase more regularly Takes claritin--now more regularly also  Current Outpatient Prescriptions on File Prior to Visit  Medication Sig Dispense Refill  . citalopram (CELEXA) 20 MG tablet Take 20 mg by mouth daily.    . Multiple Vitamin (MULTIVITAMIN) tablet Take 1 tablet by mouth daily.    . ranitidine (ZANTAC) 150 MG tablet Take 150 mg by mouth daily as needed for heartburn. Reported on 10/02/2015    . vitamin C (ASCORBIC ACID) 500 MG tablet Take 500 mg by mouth daily. Reported on 10/02/2015    . zolpidem (AMBIEN) 5 MG tablet TAKE 1 TABLET BY MOUTH AT BEDTIME AS NEEDED FOR SLEEP 30 tablet 5   No current facility-administered medications on file prior to visit.     No Known Allergies  Past Medical History:  Diagnosis Date  . Allergy   . Anemia    history of   . GERD (gastroesophageal reflux disease)   . Heartburn   . Hemorrhoids   . IBS (irritable bowel syndrome)     Past Surgical History:  Procedure Laterality Date  . BILATERAL SALPINGECTOMY Bilateral 01/25/2013   Procedure: BILATERAL SALPINGECTOMY;  Surgeon: Olga Millers, MD;  Location: [facility];  Service: Gynecology;  Laterality: Bilateral;  . LAPAROSCOPIC ASSISTED VAGINAL HYSTERECTOMY N/A 01/25/2013   Procedure: LAPAROSCOPIC ASSISTED VAGINAL HYSTERECTOMY;  Surgeon: Olga Millers, MD;  Location: [facility];  Service: Gynecology;  Laterality: N/A;  . thumb surgery Left     Family History  Problem Relation Age of Onset  . Hypertension Mother     . Hyperlipidemia Mother   . Colon polyps Mother   . Hypertension Sister   . Heart disease Maternal Grandmother   . Diabetes Maternal Grandmother   . Heart disease Maternal Grandfather   . Colon cancer Neg Hx   . Esophageal cancer Neg Hx   . Rectal cancer Neg Hx   . Stomach cancer Neg Hx     Social History   Social History  . Marital status: Single    Spouse name: N/A  . Number of children: N/A  . Years of education: N/A   Occupational History  . insurance    Social History Main Topics  . Smoking status: Never Smoker  . Smokeless tobacco: Never Used  . Alcohol use 4.2 oz/week    7 Standard drinks or equivalent per week     Comment: socially  . Drug use: No  . Sexual activity: Yes    Birth control/ protection: Condom   Other Topics Concern  . Not on file   Social History Narrative   Single. Lives alone. No kids.       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    [employer] grad. Degree in mass communications and public relations.       Hobbies: time with friends   Review of Systems No rash No vomiting or diarrhea Appetite is okay    Objective:   Physical Exam  Constitutional: She appears well-nourished. No distress.  HENT:  Mild maxillary tenderness No tragal tenderness TMs normal Slight pharyngeal injection Moderate nasal inflammation  Neck: No thyromegaly present.  Pulmonary/Chest: Effort normal and breath sounds normal. No respiratory distress. She has no wheezes. She has no rales.  Lymphadenopathy:    She has no cervical adenopathy.          Assessment & Plan:

## 2016-09-27 NOTE — Patient Instructions (Signed)
Please let me know if you are worsening as next week goes on.

## 2016-09-27 NOTE — Assessment & Plan Note (Signed)
Discussed likely viral etiology Supportive care---daily flonase If worsens next week, will send Rx for amoxil

## 2016-09-30 ENCOUNTER — Encounter: Payer: Self-pay | Admitting: Internal Medicine

## 2016-10-01 MED ORDER — AMOXICILLIN 500 MG PO TABS: 1000.0000 mg | ORAL_TABLET | Freq: Two times a day (BID) | ORAL | 0 refills | Status: AC

## 2016-10-01 MED ORDER — PREDNISONE 20 MG PO TABS: 40.0000 mg | ORAL_TABLET | Freq: Every day | ORAL | 0 refills | Status: DC

## 2016-10-03 ENCOUNTER — Other Ambulatory Visit: Payer: Self-pay | Admitting: Family Medicine

## 2016-10-03 MED ORDER — BENZONATATE 200 MG PO CAPS: 200.0000 mg | ORAL_CAPSULE | Freq: Three times a day (TID) | ORAL | 0 refills | Status: DC | PRN

## 2016-12-12 ENCOUNTER — Encounter: Payer: Self-pay | Admitting: Family Medicine

## 2016-12-16 ENCOUNTER — Other Ambulatory Visit: Payer: Self-pay

## 2016-12-16 MED ORDER — ZOLPIDEM TARTRATE 5 MG PO TABS: ORAL_TABLET | ORAL | 5 refills | Status: DC

## 2017-02-17 ENCOUNTER — Ambulatory Visit (HOSPITAL_COMMUNITY)
Admission: EM | Admit: 2017-02-17 | Discharge: 2017-02-17 | Disposition: A | Discharge status: Home / Self Care | Payer: 59 | Attending: Family Medicine | Admitting: Family Medicine

## 2017-02-17 ENCOUNTER — Encounter (HOSPITAL_COMMUNITY): Payer: Self-pay | Admitting: Family Medicine

## 2017-02-17 DIAGNOSIS — L03011 Cellulitis of right finger: Secondary | ICD-10-CM

## 2017-02-17 MED ORDER — DOXYCYCLINE HYCLATE 100 MG PO TABS: 100.0000 mg | ORAL_TABLET | Freq: Two times a day (BID) | ORAL | 0 refills | Status: DC

## 2017-02-17 MED ORDER — HYDROCODONE-ACETAMINOPHEN 5-325 MG PO TABS: 1.0000 | ORAL_TABLET | Freq: Four times a day (QID) | ORAL | 0 refills | Status: DC | PRN

## 2017-02-17 MED ORDER — LIDOCAINE HCL (PF) 2 % IJ SOLN
INTRAMUSCULAR | Status: AC
  Filled 2017-02-17: qty 2

## 2017-02-17 NOTE — ED Triage Notes (Signed)
Pt    Reports    Pain    r  Ring  Finger   With swelling   Base  Of  Nail      Denies   Any  Injury      Pt  Reports  Was  Trimming   Her  Fingernails   Prior  To  The   Symptoms

## 2017-02-17 NOTE — ED Provider Notes (Signed)
[place]    CSN: [id number] Arrival date & time: 02/17/17  1805     History   Chief Complaint Chief Complaint  Patient presents with  . Finger Injury    HPI Karla Howell is a 51 y.o. female.   One week of progressive swelling at proximal edge of right fourth fingernail with tenderness.  No known instigating event.  Patient does office work with lots of typing      Past Medical History:  Diagnosis Date  . Allergy   . Anemia    history of   . GERD (gastroesophageal reflux disease)   . Heartburn   . Hemorrhoids   . IBS (irritable bowel syndrome)     Patient Active Problem List   Diagnosis Date Noted  . Acute non-recurrent maxillary sinusitis 09/27/2016  . Essential hypertension 07/19/2014  . Generalized anxiety disorder 03/15/2014  . Allergic rhinitis 03/26/2013  . Anemia 01/21/2012  . IBS 10/25/2009    Past Surgical History:  Procedure Laterality Date  . BILATERAL SALPINGECTOMY Bilateral 01/25/2013   Procedure: BILATERAL SALPINGECTOMY;  Surgeon: Olga Millers, MD;  Location: [facility];  Service: Gynecology;  Laterality: Bilateral;  . LAPAROSCOPIC ASSISTED VAGINAL HYSTERECTOMY N/A 01/25/2013   Procedure: LAPAROSCOPIC ASSISTED VAGINAL HYSTERECTOMY;  Surgeon: Olga Millers, MD;  Location: [facility];  Service: Gynecology;  Laterality: N/A;  . thumb surgery Left     OB History    No data available       Home Medications    Prior to Admission medications   Medication Sig Start Date End Date Taking? Authorizing Provider  citalopram (CELEXA) 20 MG tablet Take 20 mg by mouth daily.    [provider]  doxycycline (VIBRA-TABS) 100 MG tablet Take 1 tablet (100 mg total) by mouth 2 (two) times daily. 02/17/17   Robyn Haber, MD  HYDROcodone-acetaminophen (NORCO) 5-325 MG tablet Take 1 tablet by mouth every 6 (six) hours as needed for moderate pain. 02/17/17   Robyn Haber, MD  Multiple Vitamin (MULTIVITAMIN) tablet Take 1 tablet  by mouth daily.    [provider]  predniSONE (DELTASONE) 20 MG tablet Take 2 tablets (40 mg total) by mouth daily. 10/01/16   Venia Carbon, MD  ranitidine (ZANTAC) 150 MG tablet Take 150 mg by mouth daily as needed for heartburn. Reported on 10/02/2015    [provider]  vitamin C (ASCORBIC ACID) 500 MG tablet Take 500 mg by mouth daily. Reported on 10/02/2015    [provider]  zolpidem (AMBIEN) 5 MG tablet TAKE 1 TABLET BY MOUTH AT BEDTIME AS NEEDED FOR SLEEP 12/16/16   Marin Olp, MD    Family History Family History  Problem Relation Age of Onset  . Hypertension Mother   . Hyperlipidemia Mother   . Colon polyps Mother   . Hypertension Sister   . Heart disease Maternal Grandmother   . Diabetes Maternal Grandmother   . Heart disease Maternal Grandfather   . Colon cancer Neg Hx   . Esophageal cancer Neg Hx   . Rectal cancer Neg Hx   . Stomach cancer Neg Hx     Social History Social History  Substance Use Topics  . Smoking status: Never Smoker  . Smokeless tobacco: Never Used  . Alcohol use 4.2 oz/week    7 Standard drinks or equivalent per week     Comment: socially     Allergies   Patient has no known allergies.   Review of Systems  Review of Systems  All other systems reviewed and are negative.    Physical Exam Triage Vital Signs ED Triage Vitals  Enc Vitals Group     BP      Pulse      Resp      Temp      Temp src      SpO2      Weight      Height      Head Circumference      Peak Flow      Pain Score      Pain Loc      Pain Edu?      Excl. in [place]?    No data found.   Updated Vital Signs BP (!) 142/86 (BP Location: Right Arm)   Pulse 78   Temp 98.6 F (37 C) (Oral)   Resp 18   LMP 01/06/2013   SpO2 100%   Visual Acuity Right Eye Distance:   Left Eye Distance:   Bilateral Distance:    Right Eye Near:   Left Eye Near:    Bilateral Near:     Physical Exam  Constitutional: She is oriented to  person, place, and time. She appears well-developed and well-nourished.  HENT:  Right Ear: External ear normal.  Left Ear: External ear normal.  Mouth/Throat: Oropharynx is clear and moist.  Eyes: Pupils are equal, round, and reactive to light. Conjunctivae are normal.  Neck: Normal range of motion. Neck supple.  Pulmonary/Chest: Effort normal.  Musculoskeletal: Normal range of motion.  Neurological: She is alert and oriented to person, place, and time.  Nursing note and vitals reviewed.    UC Treatments / Results  Labs (all labs ordered are listed, but only abnormal results are displayed) Labs Reviewed - No data to display  EKG  EKG Interpretation None       Radiology No results found.  Procedures .[name] KitchenIncision and Drainage Date/Time: 02/17/2017 7:52 PM Performed by: Robyn Haber Authorized by: Robyn Haber   Consent:    Consent obtained:  Verbal   Consent given by:  Patient   Risks discussed:  Bleeding, incomplete drainage and infection   Alternatives discussed:  No treatment Location:    Type:  Abscess   Location:  Upper extremity   Upper extremity location:  Finger   Finger location:  R ring finger Pre-procedure details:    Skin preparation:  Betadine Anesthesia (see MAR for exact dosages):    Anesthesia method:  Nerve block   Block anesthetic:  Lidocaine 2% w/o epi   Block outcome:  Anesthesia achieved Procedure type:    Complexity:  Simple Procedure details:    Needle aspiration: no     Incision types:  Stab incision   Incision depth:  Dermal   Scalpel blade:  11   Wound management:  Probed and deloculated   Drainage:  Bloody   Packing materials:  None Post-procedure details:    Patient tolerance of procedure:  Tolerated well, no immediate complications   (including critical care time)  Medications Ordered in UC Medications - No data to display   Initial Impression / Assessment and Plan / UC Course  I have reviewed the triage vital  signs and the nursing notes.  Pertinent labs & imaging results that were available during my care of the patient were reviewed by me and considered in my medical decision making (see chart for details).     Final Clinical Impressions(s) / UC Diagnoses   Final diagnoses:  Paronychia of right ring finger    New Prescriptions New Prescriptions   DOXYCYCLINE (VIBRA-TABS) 100 MG TABLET    Take 1 tablet (100 mg total) by mouth 2 (two) times daily.   HYDROCODONE-ACETAMINOPHEN (NORCO) 5-325 MG TABLET    Take 1 tablet by mouth every 6 (six) hours as needed for moderate pain.     Controlled Substance Prescriptions [place] Controlled Substance Registry consulted? Not Applicable   Robyn Haber, MD 02/17/17 [phone]

## 2017-03-12 DIAGNOSIS — R5383 Other fatigue: Secondary | ICD-10-CM | POA: Diagnosis not present

## 2017-03-16 DIAGNOSIS — N951 Menopausal and female climacteric states: Secondary | ICD-10-CM | POA: Diagnosis not present

## 2017-03-16 DIAGNOSIS — Z01419 Encounter for gynecological examination (general) (routine) without abnormal findings: Secondary | ICD-10-CM | POA: Diagnosis not present

## 2017-03-16 DIAGNOSIS — Z0000 Encounter for general adult medical examination without abnormal findings: Secondary | ICD-10-CM | POA: Diagnosis not present

## 2017-03-16 DIAGNOSIS — Z1231 Encounter for screening mammogram for malignant neoplasm of breast: Secondary | ICD-10-CM | POA: Diagnosis not present

## 2017-03-16 LAB — HM MAMMOGRAPHY

## 2017-03-17 LAB — HEPATIC FUNCTION PANEL
ALT: 24 (ref 7–35)
AST: 32 (ref 13–35)
Alkaline Phosphatase: 86 (ref 25–125)
Bilirubin, Total: 0.2

## 2017-03-17 LAB — BASIC METABOLIC PANEL
BUN: 13 (ref 4–21)
CREATININE: 1 (ref 0.5–1.1)
Glucose: 95
Potassium: 4.8 (ref 3.4–5.3)
Sodium: 139 (ref 137–147)

## 2017-03-17 LAB — CBC AND DIFFERENTIAL
HCT: 37 (ref 36–46)
Hemoglobin: 12.1 (ref 12.0–16.0)
Platelets: 327 (ref 150–399)
WBC: 5.2

## 2017-03-17 LAB — LIPID PANEL
CHOLESTEROL: 159 (ref 0–200)
HDL: 48 (ref 35–70)
LDL Cholesterol: 62
TRIGLYCERIDES: 243 — AB (ref 40–160)

## 2017-03-17 LAB — HEMOGLOBIN A1C: Hemoglobin A1C: 5.1

## 2017-03-17 LAB — VITAMIN B12: VITAMIN B 12: 558

## 2017-03-17 LAB — TSH: TSH: 2.17 (ref 0.41–5.90)

## 2017-03-17 LAB — VITAMIN D 25 HYDROXY (VIT D DEFICIENCY, FRACTURES): VIT D 25 HYDROXY: 28.1

## 2017-03-24 ENCOUNTER — Encounter: Payer: Self-pay | Admitting: Family Medicine

## 2017-06-05 ENCOUNTER — Ambulatory Visit (INDEPENDENT_AMBULATORY_CARE_PROVIDER_SITE_OTHER): Payer: 59

## 2017-06-05 DIAGNOSIS — Z23 Encounter for immunization: Secondary | ICD-10-CM | POA: Diagnosis not present

## 2017-06-05 NOTE — Progress Notes (Signed)
Patient in this morning for Influenza Vaccine. She states it is the first time she has received the vaccine so patient education completed. VIS given to patient. She tolerated well.

## 2017-07-01 ENCOUNTER — Encounter: Payer: Self-pay | Admitting: Family Medicine

## 2017-07-13 ENCOUNTER — Telehealth: Payer: Self-pay | Admitting: Family Medicine

## 2017-07-13 NOTE — Telephone Encounter (Signed)
Copied from [place] [id number]. Topic: Quick Communication - Rx Refill/Question >> Jul 13, 2017  3:50 PM Cecelia Byars, NT wrote: Medication: ambien 5 mg  Has the patient contacted their pharmacy? {yes no: (Agent: If no, request that the patient contact the pharmacy for the refill.) Preferred Pharm[facility] (with phone number [facility] [address] [phone] Agent: Please be advised that RX refills may take up to 3 business days. We ask that you follow-up with your pharmacy.

## 2017-07-13 NOTE — Telephone Encounter (Signed)
Refill  Request  Ambien    Last  Fill   12/16/2016    Last  OV  Addressed   04/11/2016        [facility]  [facility]

## 2017-07-14 ENCOUNTER — Other Ambulatory Visit: Payer: Self-pay

## 2017-07-14 ENCOUNTER — Ambulatory Visit: Payer: 59 | Admitting: Family Medicine

## 2017-07-14 ENCOUNTER — Encounter: Payer: Self-pay | Admitting: Family Medicine

## 2017-07-14 VITALS — BP 140/90 | HR 86 | Temp 98.3°F | Ht 63.25 in | Wt 171.6 lb

## 2017-07-14 DIAGNOSIS — I10 Essential (primary) hypertension: Secondary | ICD-10-CM | POA: Diagnosis not present

## 2017-07-14 DIAGNOSIS — F411 Generalized anxiety disorder: Secondary | ICD-10-CM | POA: Diagnosis not present

## 2017-07-14 MED ORDER — ZOLPIDEM TARTRATE 5 MG PO TABS: ORAL_TABLET | ORAL | 5 refills | Status: DC

## 2017-07-14 MED ORDER — CITALOPRAM HYDROBROMIDE 10 MG PO TABS: 5.0000 mg | ORAL_TABLET | Freq: Every day | ORAL | 3 refills | Status: DC

## 2017-07-14 NOTE — Telephone Encounter (Signed)
Prescription sent to pharmacy as requested.

## 2017-07-14 NOTE — Assessment & Plan Note (Addendum)
S: patient has been on celexa 20mg  since 2015. We tried lexapro but did better with celexa- was too sleepy on 40mg  dose.   She reports she has started to relax more with her work which was the primary stressor.  She is felt much better since that time.She reduced to 10mg  about 2 weeks ago and has still felt really good.  Her GAD 7 today is scoring a 1.  No suicidal ideation.  No anhedonia.  No depressed mood.  A/P:Patient desires to wean off of Celexa if possible.  Since her symptoms are well controlled, we constructed the following plan -continue 10mg  for another 4 weeks - then reduce to half tablet of 10mg  pill (5mg ) for another 6 weeks - at that point, fill out a GAD7 score for me and send to me by mychart - dont stop medicine completely until we chat through there- but if still looks well controlled we will plan to stop at that point.   She will continue Ambien for sleep. frequent change in sleeping location makes sleep particularly difficult with travel for work

## 2017-07-14 NOTE — Assessment & Plan Note (Addendum)
S: controlled poorly on no medication.  Patient has been able to control her blood pressure in the past with regular exercise and weight loss.  She is up 10 pounds over the last 2-3 years though.  She has not been on the road as much lately with work and has just enjoyed being home but admits she needs to restart her exercise also ate poorly over the holidays BP Readings from Last 3 Encounters:  07/14/17 140/90  02/17/17 (!) 142/86  09/27/16 128/84  A/P: We discussed blood pressure goal of <140/90. Continue without medications and follow-up in 4-6 months. She will work on the [employer] plan and regular exercise. If blood pressure is not controlled by follow up we will consider medication.

## 2017-07-14 NOTE — Patient Instructions (Signed)
-continue 10mg  for another 4 weeks - then reduce to half tablet of 10mg  pill (5mg ) for another 6 weeks - at that point, fill out a GAD7 score for me and send to me by mychart - dont stop medicine completely until we chat through there- but if still looks well controlled we will plan to stop at that point.    I want you to focus on getting your weight back down to about 160 over the next 6 months. Dash eating plan can help lower blood pressure as can increasing exercise  4-6 month follow up with [place] in office.    DASH Eating Plan DASH stands for "Dietary Approaches to Stop Hypertension." The DASH eating plan is a healthy eating plan that has been shown to reduce high blood pressure (hypertension). It may also reduce your risk for type 2 diabetes, heart disease, and stroke. The DASH eating plan may also help with weight loss. What are tips for following this plan? General guidelines  Avoid eating more than 2,300 mg (milligrams) of salt (sodium) a day. If you have hypertension, you may need to reduce your sodium intake to 1,500 mg a day.  Limit alcohol intake to no more than 1 drink a day for nonpregnant women and 2 drinks a day for men. One drink equals 12 oz of beer, 5 oz of wine, or 1 oz of hard liquor.  Work with your health care provider to maintain a healthy body weight or to lose weight. Ask what an ideal weight is for you.  Get at least 30 minutes of exercise that causes your heart to beat faster (aerobic exercise) most days of the week. Activities may include walking, swimming, or biking.  Work with your health care provider or diet and nutrition specialist (dietitian) to adjust your eating plan to your individual calorie needs. Reading food labels  Check food labels for the amount of sodium per serving. Choose foods with less than 5 percent of the Daily Value of sodium. Generally, foods with less than 300 mg of sodium per serving fit into this eating plan.  To find whole grains,  look for the word "whole" as the first word in the ingredient list. Shopping  Buy products labeled as "low-sodium" or "no salt added."  Buy fresh foods. Avoid canned foods and premade or frozen meals. Cooking  Avoid adding salt when cooking. Use salt-free seasonings or herbs instead of table salt or sea salt. Check with your health care provider or pharmacist before using salt substitutes.  Do not fry foods. Cook foods using healthy methods such as baking, boiling, grilling, and broiling instead.  Cook with heart-healthy oils, such as olive, canola, soybean, or sunflower oil. Meal planning   Eat a balanced diet that includes: ? 5 or more servings of fruits and vegetables each day. At each meal, try to fill half of your plate with fruits and vegetables. ? Up to 6-8 servings of whole grains each day. ? Less than 6 oz of lean meat, poultry, or fish each day. A 3-oz serving of meat is about the same size as a deck of cards. One egg equals 1 oz. ? 2 servings of low-fat dairy each day. ? A serving of nuts, seeds, or beans 5 times each week. ? Heart-healthy fats. Healthy fats called Omega-3 fatty acids are found in foods such as flaxseeds and coldwater fish, like sardines, salmon, and mackerel.  Limit how much you eat of the following: ? Canned or prepackaged foods. ?  Food that is high in trans fat, such as fried foods. ? Food that is high in saturated fat, such as fatty meat. ? Sweets, desserts, sugary drinks, and other foods with added sugar. ? Full-fat dairy products.  Do not salt foods before eating.  Try to eat at least 2 vegetarian meals each week.  Eat more home-cooked food and less restaurant, buffet, and fast food.  When eating at a restaurant, ask that your food be prepared with less salt or no salt, if possible. What foods are recommended? The items listed may not be a complete list. Talk with your dietitian about what dietary choices are best for you. Grains Whole-grain  or whole-wheat bread. Whole-grain or whole-wheat pasta. Brown rice. [place]. Bulgur. Whole-grain and low-sodium cereals. Pita bread. Low-fat, low-sodium crackers. Whole-wheat flour tortillas. Vegetables Fresh or frozen vegetables (raw, steamed, roasted, or grilled). Low-sodium or reduced-sodium tomato and vegetable juice. Low-sodium or reduced-sodium tomato sauce and tomato paste. Low-sodium or reduced-sodium canned vegetables. Fruits All fresh, dried, or frozen fruit. Canned fruit in natural juice (without added sugar). Meat and other protein foods Skinless chicken or [place]. Ground chicken or [place]. Pork with fat trimmed off. Fish and seafood. Egg whites. Dried beans, peas, or lentils. Unsalted nuts, nut butters, and seeds. Unsalted canned beans. Lean cuts of beef with fat trimmed off. Low-sodium, lean deli meat. Dairy Low-fat (1%) or fat-free (skim) milk. Fat-free, low-fat, or reduced-fat cheeses. Nonfat, low-sodium ricotta or cottage cheese. Low-fat or nonfat yogurt. Low-fat, low-sodium cheese. Fats and oils Soft margarine without trans fats. Vegetable oil. Low-fat, reduced-fat, or light mayonnaise and salad dressings (reduced-sodium). Canola, safflower, olive, soybean, and sunflower oils. Avocado. Seasoning and other foods Herbs. Spices. Seasoning mixes without salt. Unsalted popcorn and pretzels. Fat-free sweets. What foods are not recommended? The items listed may not be a complete list. Talk with your dietitian about what dietary choices are best for you. Grains Baked goods made with fat, such as croissants, muffins, or some breads. Dry pasta or rice meal packs. Vegetables Creamed or fried vegetables. Vegetables in a cheese sauce. Regular canned vegetables (not low-sodium or reduced-sodium). Regular canned tomato sauce and paste (not low-sodium or reduced-sodium). Regular tomato and vegetable juice (not low-sodium or reduced-sodium). Angie Fava. Olives. Fruits Canned fruit in a  light or heavy syrup. Fried fruit. Fruit in cream or butter sauce. Meat and other protein foods Fatty cuts of meat. Ribs. Fried meat. Berniece Salines. Sausage. Bologna and other processed lunch meats. Salami. Fatback. Hotdogs. Bratwurst. Salted nuts and seeds. Canned beans with added salt. Canned or smoked fish. Whole eggs or egg yolks. Chicken or [place] with skin. Dairy Whole or 2% milk, cream, and half-and-half. Whole or full-fat cream cheese. Whole-fat or sweetened yogurt. Full-fat cheese. Nondairy creamers. Whipped toppings. Processed cheese and cheese spreads. Fats and oils Butter. Stick margarine. Lard. Shortening. Ghee. Bacon fat. Tropical oils, such as coconut, palm kernel, or palm oil. Seasoning and other foods Salted popcorn and pretzels. Onion salt, garlic salt, seasoned salt, table salt, and sea salt. Worcestershire sauce. Tartar sauce. Barbecue sauce. Teriyaki sauce. Soy sauce, including reduced-sodium. Steak sauce. Canned and packaged gravies. Fish sauce. Oyster sauce. Cocktail sauce. Horseradish that you find on the shelf. Ketchup. Mustard. Meat flavorings and tenderizers. Bouillon cubes. Hot sauce and Tabasco sauce. Premade or packaged marinades. Premade or packaged taco seasonings. Relishes. Regular salad dressings. Where to find more information:  National Heart, Lung, and [place]: https://wilson-eaton.com/  American Heart Association: www.heart.org Summary  The DASH eating plan is  a healthy eating plan that has been shown to reduce high blood pressure (hypertension). It may also reduce your risk for type 2 diabetes, heart disease, and stroke.  With the DASH eating plan, you should limit salt (sodium) intake to 2,300 mg a day. If you have hypertension, you may need to reduce your sodium intake to 1,500 mg a day.  When on the DASH eating plan, aim to eat more fresh fruits and vegetables, whole grains, lean proteins, low-fat dairy, and heart-healthy fats.  Work with your health care  provider or diet and nutrition specialist (dietitian) to adjust your eating plan to your individual calorie needs. This information is not intended to replace advice given to you by your health care provider. Make sure you discuss any questions you have with your health care provider. Document Released: 05/15/2011 Document Revised: 05/19/2016 Document Reviewed: 05/19/2016 Elsevier Interactive Patient Education  [employer].

## 2017-07-14 NOTE — Progress Notes (Signed)
Subjective:  Karla Howell is a 52 y.o. year old very pleasant female patient who presents for/with See problem oriented charting ROS-much improved anxiety. No chest pain or shortness of breath. No headache or blurry vision.   Past Medical History-  Patient Active Problem List   Diagnosis Date Noted  . Generalized anxiety disorder 03/15/2014    Priority: Medium  . IBS 10/25/2009    Priority: Medium  . Allergic rhinitis 03/26/2013    Priority: Low  . Anemia 01/21/2012    Priority: Low  . Acute non-recurrent maxillary sinusitis 09/27/2016  . Essential hypertension 07/19/2014    Medications- reviewed and updated Current Outpatient Medications  Medication Sig Dispense Refill  . citalopram (CELEXA) 20 MG tablet Take 20 mg by mouth daily.    . Multiple Vitamin (MULTIVITAMIN) tablet Take 1 tablet by mouth daily.    . ranitidine (ZANTAC) 150 MG tablet Take 150 mg by mouth daily as needed for heartburn. Reported on 10/02/2015    . vitamin C (ASCORBIC ACID) 500 MG tablet Take 500 mg by mouth daily. Reported on 10/02/2015    . zolpidem (AMBIEN) 5 MG tablet TAKE 1 TABLET BY MOUTH AT BEDTIME AS NEEDED FOR SLEEP 30 tablet 5   Objective: BP 140/90   Pulse 86   Temp 98.3 F (36.8 C) (Oral)   Ht 5' 3.25" (1.607 m)   Wt 171 lb 9.6 oz (77.8 kg)   LMP 01/06/2013   SpO2 94%   BMI 30.16 kg/m  Gen: NAD, resting comfortably CV: RRR no murmurs rubs or gallops Lungs: CTAB no crackles, wheeze, rhonchi Abdomen: soft/nontender/nondistended/normal bowel sounds.  Obese Ext: no edema Skin: warm, dry  Assessment/Plan:  Generalized anxiety disorder S: patient has been on celexa 20mg  since 2015. We tried lexapro but did better with celexa- was too sleepy on 40mg  dose.   She reports she has started to relax more with her work which was the primary stressor.  She is felt much better since that time.She reduced to 10mg  about 2 weeks ago and has still felt really good.  Her GAD 7 today is scoring a 1.   No suicidal ideation.  No anhedonia.  No depressed mood.  A/P:Patient desires to wean off of Celexa if possible.  Since her symptoms are well controlled, we constructed the following plan -continue 10mg  for another 4 weeks - then reduce to half tablet of 10mg  pill (5mg ) for another 6 weeks - at that point, fill out a GAD7 score for me and send to me by mychart - dont stop medicine completely until we chat through there- but if still looks well controlled we will plan to stop at that point.   She will continue Ambien for sleep. frequent change in sleeping location makes sleep particularly difficult with travel for work  Essential hypertension S: controlled poorly on no medication.  Patient has been able to control her blood pressure in the past with regular exercise and weight loss.  She is up 10 pounds over the last 2-3 years though.  She has not been on the road as much lately with work and has just enjoyed being home but admits she needs to restart her exercise also ate poorly over the holidays BP Readings from Last 3 Encounters:  07/14/17 140/90  02/17/17 (!) 142/86  09/27/16 128/84  A/P: We discussed blood pressure goal of <140/90. Continue without medications and follow-up in 4-6 months. She will work on the [employer] plan and regular exercise. If blood pressure  is not controlled by follow up we will consider medication.     Future Appointments  Date Time Provider [place]  01/12/2018  8:15 AM Marin Olp, MD [facility]  she may or may not schedule CPE as next visit- please note labs late 2018 by Dr. Stann Mainland before ordering lipids  Meds ordered this encounter  Medications  . citalopram (CELEXA) 10 MG tablet    Sig: Take 0.5-1 tablets (5-10 mg total) by mouth daily.    Dispense:  30 tablet    Refill:  3    Return precautions advised.  Garret Reddish, MD

## 2017-08-06 DIAGNOSIS — M7581 Other shoulder lesions, right shoulder: Secondary | ICD-10-CM | POA: Diagnosis not present

## 2017-08-06 DIAGNOSIS — M7582 Other shoulder lesions, left shoulder: Secondary | ICD-10-CM | POA: Diagnosis not present

## 2017-08-11 DIAGNOSIS — M25512 Pain in left shoulder: Secondary | ICD-10-CM | POA: Diagnosis not present

## 2017-08-11 DIAGNOSIS — M25511 Pain in right shoulder: Secondary | ICD-10-CM | POA: Diagnosis not present

## 2017-08-11 DIAGNOSIS — M25611 Stiffness of right shoulder, not elsewhere classified: Secondary | ICD-10-CM | POA: Diagnosis not present

## 2017-08-18 DIAGNOSIS — M25611 Stiffness of right shoulder, not elsewhere classified: Secondary | ICD-10-CM | POA: Diagnosis not present

## 2017-08-18 DIAGNOSIS — M25511 Pain in right shoulder: Secondary | ICD-10-CM | POA: Diagnosis not present

## 2017-08-18 DIAGNOSIS — M25512 Pain in left shoulder: Secondary | ICD-10-CM | POA: Diagnosis not present

## 2017-08-26 ENCOUNTER — Other Ambulatory Visit: Payer: Self-pay

## 2017-08-26 DIAGNOSIS — M25611 Stiffness of right shoulder, not elsewhere classified: Secondary | ICD-10-CM | POA: Diagnosis not present

## 2017-08-26 DIAGNOSIS — M25512 Pain in left shoulder: Secondary | ICD-10-CM | POA: Diagnosis not present

## 2017-08-26 DIAGNOSIS — M25511 Pain in right shoulder: Secondary | ICD-10-CM | POA: Diagnosis not present

## 2017-08-26 MED ORDER — CITALOPRAM HYDROBROMIDE 10 MG PO TABS: 5.0000 mg | ORAL_TABLET | Freq: Every day | ORAL | 1 refills | Status: DC

## 2017-09-01 DIAGNOSIS — M25512 Pain in left shoulder: Secondary | ICD-10-CM | POA: Diagnosis not present

## 2017-09-01 DIAGNOSIS — M25611 Stiffness of right shoulder, not elsewhere classified: Secondary | ICD-10-CM | POA: Diagnosis not present

## 2017-09-01 DIAGNOSIS — M25511 Pain in right shoulder: Secondary | ICD-10-CM | POA: Diagnosis not present

## 2017-09-11 DIAGNOSIS — M25512 Pain in left shoulder: Secondary | ICD-10-CM | POA: Diagnosis not present

## 2017-09-11 DIAGNOSIS — M25611 Stiffness of right shoulder, not elsewhere classified: Secondary | ICD-10-CM | POA: Diagnosis not present

## 2017-09-11 DIAGNOSIS — M25511 Pain in right shoulder: Secondary | ICD-10-CM | POA: Diagnosis not present

## 2017-09-15 DIAGNOSIS — M2241 Chondromalacia patellae, right knee: Secondary | ICD-10-CM | POA: Diagnosis not present

## 2017-09-15 DIAGNOSIS — M25561 Pain in right knee: Secondary | ICD-10-CM | POA: Diagnosis not present

## 2017-09-15 DIAGNOSIS — S46011A Strain of muscle(s) and tendon(s) of the rotator cuff of right shoulder, initial encounter: Secondary | ICD-10-CM | POA: Diagnosis not present

## 2017-09-15 DIAGNOSIS — M25562 Pain in left knee: Secondary | ICD-10-CM | POA: Diagnosis not present

## 2017-09-15 DIAGNOSIS — M2242 Chondromalacia patellae, left knee: Secondary | ICD-10-CM | POA: Diagnosis not present

## 2017-09-15 DIAGNOSIS — M25511 Pain in right shoulder: Secondary | ICD-10-CM | POA: Diagnosis not present

## 2017-09-24 ENCOUNTER — Telehealth: Payer: Self-pay | Admitting: *Deleted

## 2017-09-24 ENCOUNTER — Other Ambulatory Visit: Payer: Self-pay

## 2017-09-24 MED ORDER — ZOLPIDEM TARTRATE 5 MG PO TABS: ORAL_TABLET | ORAL | 5 refills | Status: DC

## 2017-09-24 NOTE — Telephone Encounter (Signed)
MEDICATION: Zolpidem 5mg    [id number]  IS THIS A 90 DAY SUPPLY : No  LAST APPOINTMENT DATE: @2 /10/2017  NEXT APPOINTMENT DATE:@8 /11/2017  OTHER COMMENTS:    **Let patient know to contact pharmacy at the end of the day to make sure medication is ready. **  ** Please notify patient to allow 48-72 hours to process**  **Encourage patient to contact the pharmacy for refills or they can request refills through [facility]**

## 2017-09-24 NOTE — Telephone Encounter (Signed)
Called pharmacy who confirmed that they did not receive a prescription in February. I provided a verbal order

## 2017-09-28 DIAGNOSIS — M25511 Pain in right shoulder: Secondary | ICD-10-CM | POA: Diagnosis not present

## 2017-10-08 DIAGNOSIS — M25562 Pain in left knee: Secondary | ICD-10-CM | POA: Diagnosis not present

## 2017-11-03 DIAGNOSIS — M25462 Effusion, left knee: Secondary | ICD-10-CM | POA: Diagnosis not present

## 2017-11-03 DIAGNOSIS — M2550 Pain in unspecified joint: Secondary | ICD-10-CM | POA: Diagnosis not present

## 2017-11-03 DIAGNOSIS — M25461 Effusion, right knee: Secondary | ICD-10-CM | POA: Diagnosis not present

## 2017-11-10 DIAGNOSIS — M25461 Effusion, right knee: Secondary | ICD-10-CM | POA: Diagnosis not present

## 2017-11-10 DIAGNOSIS — M2550 Pain in unspecified joint: Secondary | ICD-10-CM | POA: Diagnosis not present

## 2017-11-10 DIAGNOSIS — M25462 Effusion, left knee: Secondary | ICD-10-CM | POA: Diagnosis not present

## 2018-01-12 ENCOUNTER — Ambulatory Visit: Payer: 59 | Admitting: Family Medicine

## 2018-01-26 ENCOUNTER — Ambulatory Visit: Payer: 59 | Admitting: Family Medicine

## 2018-02-10 DIAGNOSIS — M25461 Effusion, right knee: Secondary | ICD-10-CM | POA: Diagnosis not present

## 2018-02-10 DIAGNOSIS — M25462 Effusion, left knee: Secondary | ICD-10-CM | POA: Diagnosis not present

## 2018-02-10 DIAGNOSIS — M0600 Rheumatoid arthritis without rheumatoid factor, unspecified site: Secondary | ICD-10-CM | POA: Diagnosis not present

## 2018-02-27 ENCOUNTER — Ambulatory Visit: Payer: 59 | Admitting: Family Medicine

## 2018-02-27 ENCOUNTER — Encounter: Payer: Self-pay | Admitting: Family Medicine

## 2018-02-27 VITALS — BP 128/88 | HR 79 | Temp 98.6°F | Resp 14 | Wt 172.0 lb

## 2018-02-27 DIAGNOSIS — J0100 Acute maxillary sinusitis, unspecified: Secondary | ICD-10-CM

## 2018-02-27 DIAGNOSIS — H6121 Impacted cerumen, right ear: Secondary | ICD-10-CM | POA: Diagnosis not present

## 2018-02-27 MED ORDER — CARBAMIDE PEROXIDE 6.5 % OT SOLN: 5.0000 [drp] | Freq: Two times a day (BID) | OTIC | 1 refills | Status: DC

## 2018-02-27 MED ORDER — DOXYCYCLINE HYCLATE 100 MG PO TABS: 100.0000 mg | ORAL_TABLET | Freq: Two times a day (BID) | ORAL | 0 refills | Status: DC

## 2018-02-27 NOTE — Patient Instructions (Addendum)
.  Rest, hydrate.  Start:  flonase nasal spray, daily antihistamine and mucinex.  nettie pot or nasal saline can be helpful as well.  Doxycyline  Prescribed for sinusitis , take until completed.  Debrox ear cleansing solution prescribed to help keep ear wax from building up. You can use this when you feel a build up and/or once a month to keep from building up.    F/U 2 weeks of not improved.     Sinusitis, Adult Sinusitis is soreness and inflammation of your sinuses. Sinuses are hollow spaces in the bones around your face. They are located:  Around your eyes.  In the middle of your forehead.  Behind your nose.  In your cheekbones.  Your sinuses and nasal passages are lined with a stringy fluid (mucus). Mucus normally drains out of your sinuses. When your nasal tissues get inflamed or swollen, the mucus can get trapped or blocked so air cannot flow through your sinuses. This lets bacteria, viruses, and funguses grow, and that leads to infection. Follow these instructions at home: Medicines  Take, use, or apply over-the-counter and prescription medicines only as told by your doctor. These may include nasal sprays.  If you were prescribed an antibiotic medicine, take it as told by your doctor. Do not stop taking the antibiotic even if you start to feel better. Hydrate and Humidify  Drink enough water to keep your pee (urine) clear or pale yellow.  Use a cool mist humidifier to keep the humidity level in your home above 50%.  Breathe in steam for 10-15 minutes, 3-4 times a day or as told by your doctor. You can do this in the bathroom while a hot shower is running.  Try not to spend time in cool or dry air. Rest  Rest as much as possible.  Sleep with your head raised (elevated).  Make sure to get enough sleep each night. General instructions  Put a warm, moist washcloth on your face 3-4 times a day or as told by your doctor. This will help with discomfort.  Wash your hands  often with soap and water. If there is no soap and water, use hand sanitizer.  Do not smoke. Avoid being around people who are smoking (secondhand smoke).  Keep all follow-up visits as told by your doctor. This is important. Contact a doctor if:  You have a fever.  Your symptoms get worse.  Your symptoms do not get better within 10 days. Get help right away if:  You have a very bad headache.  You cannot stop throwing up (vomiting).  You have pain or swelling around your face or eyes.  You have trouble seeing.  You feel confused.  Your neck is stiff.  You have trouble breathing. This information is not intended to replace advice given to you by your health care provider. Make sure you discuss any questions you have with your health care provider. Document Released: 11/12/2007 Document Revised: 01/20/2016 Document Reviewed: 03/21/2015 Elsevier Interactive Patient Education  [employer].

## 2018-02-27 NOTE — Progress Notes (Signed)
Karla Howell , June 13, 1965, 52 y.o., female MRN: [id number] Patient Care Team    Relationship Specialty Notifications Start End  Karla Olp, MD PCP - General Family Medicine  03/15/14     Chief Complaint  Patient presents with  . Ear Fullness     Subjective: Pt presents for an OV with complaints of right ear fullness of 1 week duration.  Associated symptoms include nasal congestion, runny nose, sinus pressure and headache. She denies fever, chills, nausea or cough..  Pt has tried mucinex to ease their symptoms.   Depression screen PHQ 2/9 07/14/2017  Decreased Interest 0  Down, Depressed, Hopeless 0  PHQ - 2 Score 0    No Known Allergies Social History   Tobacco Use  . Smoking status: Never Smoker  . Smokeless tobacco: Never Used  Substance Use Topics  . Alcohol use: Yes    Alcohol/week: 7.0 standard drinks    Types: 7 Standard drinks or equivalent per week    Comment: socially   Past Medical History:  Diagnosis Date  . Allergy   . Anemia    history of   . GERD (gastroesophageal reflux disease)   . Heartburn   . Hemorrhoids   . IBS (irritable bowel syndrome)    Past Surgical History:  Procedure Laterality Date  . BILATERAL SALPINGECTOMY Bilateral 01/25/2013   Procedure: BILATERAL SALPINGECTOMY;  Surgeon: Karla Millers, MD;  Location: [facility];  Service: Gynecology;  Laterality: Bilateral;  . LAPAROSCOPIC ASSISTED VAGINAL HYSTERECTOMY N/A 01/25/2013   Procedure: LAPAROSCOPIC ASSISTED VAGINAL HYSTERECTOMY;  Surgeon: Karla Millers, MD;  Location: [facility];  Service: Gynecology;  Laterality: N/A;  . thumb surgery Left    Family History  Problem Relation Age of Onset  . Hypertension Mother   . Hyperlipidemia Mother   . Colon polyps Mother   . Hypertension Sister   . Heart disease Maternal Grandmother   . Diabetes Maternal Grandmother   . Heart disease Maternal Grandfather   . Colon cancer Neg Hx   . Esophageal cancer Neg Hx   . Rectal cancer Neg Hx    . Stomach cancer Neg Hx    Allergies as of 02/27/2018   No Known Allergies     Medication List        Accurate as of 02/27/18 10:58 AM. Always use your most recent med list.          HUMIRA PEN 40 MG/0.4ML Pnkt Generic drug:  Adalimumab Inject 1 mL into the muscle every 14 (fourteen) days.   multivitamin tablet Take 1 tablet by mouth daily.   vitamin C 500 MG tablet Commonly known as:  ASCORBIC ACID Take 500 mg by mouth daily. Reported on 10/02/2015   ZANTAC 150 MG tablet Generic drug:  ranitidine Take 150 mg by mouth daily as needed for heartburn. Reported on 10/02/2015   zolpidem 5 MG tablet Commonly known as:  AMBIEN TAKE 1 TABLET BY MOUTH AT BEDTIME AS NEEDED FOR SLEEP       All past medical history, surgical history, allergies, family history, immunizations andmedications were updated in the EMR today and reviewed under the history and medication portions of their EMR.     ROS: Negative, with the exception of above mentioned in HPI   Objective:  BP 128/88   Pulse 79   Temp 98.6 F (37 C) (Oral)   Resp 14   Wt 172 lb (78 kg)   LMP 01/06/2013   SpO2 98%  BMI 30.23 kg/m  Body mass index is 30.23 kg/m. Gen: Afebrile. No acute distress. Nontoxic in appearance, well developed, well nourished.  HENT: AT. [place]. Bilateral EAM with cerumen impaction. MMM, no oral lesions. Bilateral nares with severe swelling and erythema. Throat without erythema or exudates. PND, TTP max sinus present Eyes:Pupils Equal Round Reactive to light, Extraocular movements intact,  Conjunctiva without redness, discharge or icterus. Neck/lymp/endocrine: Supple,no lymphadenopathy CV: RRR  Chest: CTAB, no wheeze or crackles. Good air movement, normal resp effort.   No exam data present No results found. No results found for this or any previous visit (from the past 24 hour(s)).  Assessment/Plan: Karla Howell is a 52 y.o. female present for OV for  Impacted cerumen of right  ear Ear lavage bilateral today. Pt tolerated well. Bilateral TM without erythema, fullness or perforation. Ear symptoms relieved.  Debrox sol for once monthly use to keep cerumen to minimum.    Acute non-recurrent maxillary sinusitis Rest, hydrate.  Start:  flonase nasal spray, daily antihistamine and mucinex.  nettie pot or nasal saline can be helpful as well.  Doxycyline  Prescribed for sinusitis , take until completed.  Debrox ear cleansing solution prescribed to help keep ear wax from building up. You can use this when you feel a build up and/or once a month to keep from building up.    F/U 2 weeks of not improved.    Reviewed expectations re: course of current medical issues.  Discussed self-management of symptoms.  Outlined signs and symptoms indicating need for more acute intervention.  Patient verbalized understanding and all questions were answered.  Patient received an After-Visit Summary.    No orders of the defined types were placed in this encounter.    Note is dictated utilizing voice recognition software. Although note has been proof read prior to signing, occasional typographical errors still can be missed. If any questions arise, please do not hesitate to call for verification.   electronically signed by:  Karla Pouch, DO  [place]

## 2018-03-23 DIAGNOSIS — Z1231 Encounter for screening mammogram for malignant neoplasm of breast: Secondary | ICD-10-CM | POA: Diagnosis not present

## 2018-03-23 DIAGNOSIS — Z01419 Encounter for gynecological examination (general) (routine) without abnormal findings: Secondary | ICD-10-CM | POA: Diagnosis not present

## 2018-03-23 LAB — HM MAMMOGRAPHY

## 2018-03-27 ENCOUNTER — Encounter: Payer: Self-pay | Admitting: Family Medicine

## 2018-03-27 DIAGNOSIS — Z90710 Acquired absence of both cervix and uterus: Secondary | ICD-10-CM | POA: Insufficient documentation

## 2018-04-01 ENCOUNTER — Telehealth: Payer: Self-pay | Admitting: Family Medicine

## 2018-04-01 ENCOUNTER — Encounter: Payer: Self-pay | Admitting: Family Medicine

## 2018-04-01 NOTE — Telephone Encounter (Signed)
I called and spoke with the front office rep at [facility] requesting the patient's mammography records. Their office stated they would fax the records to our office this week.

## 2018-04-02 ENCOUNTER — Encounter: Payer: Self-pay | Admitting: Family Medicine

## 2018-04-13 ENCOUNTER — Ambulatory Visit: Payer: 59

## 2018-04-20 ENCOUNTER — Encounter: Payer: Self-pay | Admitting: Family Medicine

## 2018-04-23 ENCOUNTER — Ambulatory Visit (INDEPENDENT_AMBULATORY_CARE_PROVIDER_SITE_OTHER): Payer: 59 | Admitting: Family Medicine

## 2018-04-23 ENCOUNTER — Ambulatory Visit: Payer: 59

## 2018-04-23 ENCOUNTER — Encounter: Payer: Self-pay | Admitting: Family Medicine

## 2018-04-23 VITALS — BP 126/78 | HR 86 | Temp 98.6°F | Ht 63.0 in | Wt 175.0 lb

## 2018-04-23 DIAGNOSIS — Z23 Encounter for immunization: Secondary | ICD-10-CM | POA: Diagnosis not present

## 2018-04-23 DIAGNOSIS — J329 Chronic sinusitis, unspecified: Secondary | ICD-10-CM | POA: Diagnosis not present

## 2018-04-23 MED ORDER — IPRATROPIUM BROMIDE 0.06 % NA SOLN: 2.0000 | Freq: Four times a day (QID) | NASAL | 0 refills | Status: DC

## 2018-04-23 MED ORDER — AMOXICILLIN-POT CLAVULANATE 875-125 MG PO TABS: 1.0000 | ORAL_TABLET | Freq: Two times a day (BID) | ORAL | 0 refills | Status: DC

## 2018-04-23 NOTE — Progress Notes (Signed)
   Subjective:  Karla Howell is a 52 y.o. female who presents today for same-day appointment with a chief complaint of sinus congestion.   HPI:  Sinus Congestion, Acute problem Started 2 months ago. Was diagnosed with a sinus infection and started on doxycycline about 2 months ago.  Symptoms improved for a few weeks however over the last couple weeks that began to worsen again.  She feels a lot of congestion in her sinuses.  No fevers or chills.  Some decreased energy levels.  She has tried several over-the-counter nasal sprays and oral medication with modest improvement.  ROS: Per HPI  Objective:  Physical Exam: BP 126/78 (BP Location: Left Arm, Patient Position: Sitting, Cuff Size: Normal)   Pulse 86   Temp 98.6 F (37 C) (Oral)   Ht 5\' 3"  (1.6 m)   Wt 175 lb (79.4 kg)   LMP 01/06/2013   SpO2 96%   BMI 31.00 kg/m   Gen: NAD, resting comfortably HEENT: TMs with clear effusion.  Maxillary sinuses with decreased transillumination bilaterally.  Oropharynx slightly erythematous without exudate. CV: RRR with no murmurs appreciated Pulm: NWOB, CTAB with no crackles, wheezes, or rhonchi  Assessment/Plan:  Sinusitis Given that symptoms have been persistent for 2 months and have worsened recently, will start 10-day course of Augmentin.  Also start Atrovent nasal spray.  Recommended good oral hydration.  Recommended Tylenol/or Motrin as needed.  Would avoid use of corticosteroids given that she recently started Humira.  Discussed reasons to return to care.  Follow-up as needed.  Pre[facility]ative Healthcare Flu shot given today.   Algis Greenhouse. Jerline Pain, MD 04/23/2018 3:15 PM

## 2018-04-23 NOTE — Patient Instructions (Signed)
It was very nice to see you today!  I think you have an sinus infection,  Please start the Augmentin and Atrovent.  Please stay well-hydrated.  You can take Tylenol or Motrin as needed.  Please let me know if your symptoms worsen or do not improve the next few days.  Take care, Dr Jerline Pain

## 2018-05-27 ENCOUNTER — Encounter (HOSPITAL_COMMUNITY): Payer: Self-pay

## 2018-05-27 ENCOUNTER — Emergency Department (HOSPITAL_COMMUNITY)
Admission: EM | Admit: 2018-05-27 | Discharge: 2018-05-28 | Disposition: A | Discharge status: Home / Self Care | Payer: 59 | Attending: Emergency Medicine | Admitting: Emergency Medicine

## 2018-05-27 DIAGNOSIS — R52 Pain, unspecified: Secondary | ICD-10-CM | POA: Diagnosis not present

## 2018-05-27 DIAGNOSIS — S299XXA Unspecified injury of thorax, initial encounter: Secondary | ICD-10-CM | POA: Diagnosis not present

## 2018-05-27 DIAGNOSIS — M62830 Muscle spasm of back: Secondary | ICD-10-CM | POA: Diagnosis not present

## 2018-05-27 DIAGNOSIS — Y92410 Unspecified street and highway as the place of occurrence of the external cause: Secondary | ICD-10-CM | POA: Diagnosis not present

## 2018-05-27 DIAGNOSIS — Y9389 Activity, other specified: Secondary | ICD-10-CM | POA: Diagnosis not present

## 2018-05-27 DIAGNOSIS — Y999 Unspecified external cause status: Secondary | ICD-10-CM | POA: Diagnosis not present

## 2018-05-27 DIAGNOSIS — S20219A Contusion of unspecified front wall of thorax, initial encounter: Secondary | ICD-10-CM | POA: Diagnosis not present

## 2018-05-27 DIAGNOSIS — S2020XA Contusion of thorax, unspecified, initial encounter: Secondary | ICD-10-CM | POA: Diagnosis not present

## 2018-05-27 DIAGNOSIS — Z79899 Other long term (current) drug therapy: Secondary | ICD-10-CM | POA: Insufficient documentation

## 2018-05-27 DIAGNOSIS — M545 Low back pain: Secondary | ICD-10-CM | POA: Diagnosis not present

## 2018-05-27 DIAGNOSIS — R079 Chest pain, unspecified: Secondary | ICD-10-CM | POA: Diagnosis not present

## 2018-05-27 DIAGNOSIS — I10 Essential (primary) hypertension: Secondary | ICD-10-CM | POA: Diagnosis not present

## 2018-05-27 DIAGNOSIS — S39012A Strain of muscle, fascia and tendon of lower back, initial encounter: Secondary | ICD-10-CM

## 2018-05-27 MED ORDER — HYDROMORPHONE HCL 1 MG/ML IJ SOLN
1.0000 mg | Freq: Once | INTRAMUSCULAR | Status: AC
  Administered 2018-05-27: 1 mg via INTRAMUSCULAR
  Filled 2018-05-27: qty 1

## 2018-05-27 MED ORDER — KETOROLAC TROMETHAMINE 30 MG/ML IJ SOLN
30.0000 mg | Freq: Once | INTRAMUSCULAR | Status: AC
  Administered 2018-05-27: 30 mg via INTRAMUSCULAR
  Filled 2018-05-27: qty 1
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
## 2018-05-27 NOTE — ED Notes (Signed)
While ambulating pt in hallway, pt states still experiencing back spasms and sharp pain.

## 2018-06-11 DIAGNOSIS — S335XXA Sprain of ligaments of lumbar spine, initial encounter: Secondary | ICD-10-CM | POA: Diagnosis not present

## 2018-06-11 DIAGNOSIS — M25462 Effusion, left knee: Secondary | ICD-10-CM | POA: Diagnosis not present

## 2018-06-11 DIAGNOSIS — M150 Primary generalized (osteo)arthritis: Secondary | ICD-10-CM | POA: Diagnosis not present

## 2018-06-11 DIAGNOSIS — M0600 Rheumatoid arthritis without rheumatoid factor, unspecified site: Secondary | ICD-10-CM | POA: Diagnosis not present

## 2018-06-17 DIAGNOSIS — M545 Low back pain: Secondary | ICD-10-CM | POA: Diagnosis not present

## 2018-06-21 ENCOUNTER — Encounter: Payer: Self-pay | Admitting: Family Medicine

## 2018-06-22 ENCOUNTER — Other Ambulatory Visit: Payer: Self-pay

## 2018-06-22 DIAGNOSIS — M545 Low back pain: Secondary | ICD-10-CM | POA: Diagnosis not present

## 2018-06-22 MED ORDER — ZOLPIDEM TARTRATE 5 MG PO TABS: ORAL_TABLET | ORAL | 2 refills | Status: DC

## 2018-06-24 DIAGNOSIS — M545 Low back pain: Secondary | ICD-10-CM | POA: Diagnosis not present

## 2018-06-29 DIAGNOSIS — M545 Low back pain: Secondary | ICD-10-CM | POA: Diagnosis not present

## 2018-07-01 ENCOUNTER — Encounter: Payer: Self-pay | Admitting: Family Medicine

## 2018-07-01 DIAGNOSIS — M545 Low back pain: Secondary | ICD-10-CM | POA: Diagnosis not present

## 2018-07-06 DIAGNOSIS — M545 Low back pain: Secondary | ICD-10-CM | POA: Diagnosis not present

## 2018-07-08 DIAGNOSIS — M545 Low back pain: Secondary | ICD-10-CM | POA: Diagnosis not present

## 2018-07-13 DIAGNOSIS — M545 Low back pain: Secondary | ICD-10-CM | POA: Diagnosis not present

## 2018-07-15 DIAGNOSIS — M545 Low back pain: Secondary | ICD-10-CM | POA: Diagnosis not present

## 2018-07-19 DIAGNOSIS — M545 Low back pain: Secondary | ICD-10-CM | POA: Diagnosis not present

## 2018-07-20 DIAGNOSIS — M545 Low back pain: Secondary | ICD-10-CM | POA: Diagnosis not present

## 2018-07-21 DIAGNOSIS — M545 Low back pain: Secondary | ICD-10-CM | POA: Diagnosis not present

## 2018-07-27 DIAGNOSIS — M545 Low back pain: Secondary | ICD-10-CM | POA: Diagnosis not present

## 2018-08-03 DIAGNOSIS — M545 Low back pain: Secondary | ICD-10-CM | POA: Diagnosis not present

## 2018-08-05 DIAGNOSIS — M545 Low back pain: Secondary | ICD-10-CM | POA: Diagnosis not present

## 2018-08-18 ENCOUNTER — Ambulatory Visit: Payer: 59 | Admitting: Psychology

## 2018-09-01 ENCOUNTER — Ambulatory Visit (INDEPENDENT_AMBULATORY_CARE_PROVIDER_SITE_OTHER): Payer: 59 | Admitting: Psychology

## 2018-09-01 DIAGNOSIS — F4322 Adjustment disorder with anxiety: Secondary | ICD-10-CM

## 2018-09-15 ENCOUNTER — Ambulatory Visit: Payer: 59 | Admitting: Psychology

## 2018-09-21 ENCOUNTER — Ambulatory Visit (INDEPENDENT_AMBULATORY_CARE_PROVIDER_SITE_OTHER): Payer: Commercial Managed Care - PPO | Admitting: Psychology

## 2018-09-21 DIAGNOSIS — F4321 Adjustment disorder with depressed mood: Secondary | ICD-10-CM | POA: Diagnosis not present

## 2018-10-01 ENCOUNTER — Encounter: Payer: Self-pay | Admitting: Family Medicine

## 2018-10-01 ENCOUNTER — Ambulatory Visit (INDEPENDENT_AMBULATORY_CARE_PROVIDER_SITE_OTHER): Payer: Commercial Managed Care - PPO | Admitting: Psychology

## 2018-10-01 DIAGNOSIS — F4322 Adjustment disorder with anxiety: Secondary | ICD-10-CM

## 2018-10-01 NOTE — Telephone Encounter (Signed)
Spoke with pt, confirmed that she is not having any homocidal or suicidal ideallations. Scheduled appointment with Dr. Yong Channel for Monday at 1:00 PM.

## 2018-10-04 ENCOUNTER — Ambulatory Visit (INDEPENDENT_AMBULATORY_CARE_PROVIDER_SITE_OTHER): Payer: Commercial Managed Care - PPO | Admitting: Family Medicine

## 2018-10-04 ENCOUNTER — Encounter: Payer: Self-pay | Admitting: Family Medicine

## 2018-10-04 VITALS — Temp 98.1°F | Ht 63.0 in | Wt 168.0 lb

## 2018-10-04 DIAGNOSIS — F411 Generalized anxiety disorder: Secondary | ICD-10-CM | POA: Diagnosis not present

## 2018-10-04 DIAGNOSIS — E663 Overweight: Secondary | ICD-10-CM | POA: Diagnosis not present

## 2018-10-04 DIAGNOSIS — I10 Essential (primary) hypertension: Secondary | ICD-10-CM | POA: Diagnosis not present

## 2018-10-04 DIAGNOSIS — Z6829 Body mass index (BMI) 29.0-29.9, adult: Secondary | ICD-10-CM

## 2018-10-04 DIAGNOSIS — J301 Allergic rhinitis due to pollen: Secondary | ICD-10-CM

## 2018-10-04 DIAGNOSIS — M0600 Rheumatoid arthritis without rheumatoid factor, unspecified site: Secondary | ICD-10-CM

## 2018-10-04 DIAGNOSIS — M069 Rheumatoid arthritis, unspecified: Secondary | ICD-10-CM | POA: Insufficient documentation

## 2018-10-04 DIAGNOSIS — K219 Gastro-esophageal reflux disease without esophagitis: Secondary | ICD-10-CM | POA: Insufficient documentation

## 2018-10-04 DIAGNOSIS — G4700 Insomnia, unspecified: Secondary | ICD-10-CM

## 2018-10-04 MED ORDER — CITALOPRAM HYDROBROMIDE 10 MG PO TABS: 10.0000 mg | ORAL_TABLET | Freq: Every day | ORAL | 2 refills | Status: DC

## 2018-10-04 MED ORDER — IPRATROPIUM BROMIDE 0.06 % NA SOLN: 2.0000 | Freq: Four times a day (QID) | NASAL | 0 refills | Status: DC

## 2018-10-04 MED ORDER — HYDROXYZINE HCL 10 MG PO TABS: 10.0000 mg | ORAL_TABLET | Freq: Three times a day (TID) | ORAL | 2 refills | Status: DC | PRN

## 2018-10-04 NOTE — Assessment & Plan Note (Signed)
S: Patient doing well on over-the-counter antihistamine for the most part-uses either Allegra or Claritin generic.  Flonase has not been beneficial in the past.  She does get significant relief from ipratropium nasal spray when rhinorrhea is bothersome.  She requests a refill A/P:  Stable. Continue current medications-regular over-the-counter antihistamine.  I did refill ipratropium for sparing use

## 2018-10-04 NOTE — Assessment & Plan Note (Signed)
S: Due to recent stressors in addition to COVID-19-has been using Ambien 5 mg more lately.  She states essentially has been using on a nightly basis. A/P: Controlled on Ambien but increasing need recently-think we need to decrease her overall anxiety.  Patient thought she needed a refill but on review she should have 2 refills remaining.  I did agree to provide additional refills to get her up to 6 months from today- team will let me know if needed.
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
## 2018-10-04 NOTE — Assessment & Plan Note (Signed)
S: Zantac has been used in the past but has not needed this recently with weight loss A/P: Thankful patient has had improved symptoms recently-I did recommend if she needed something for breakthrough to use Pepcid instead of Zantac due to recall.
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
## 2018-10-04 NOTE — Assessment & Plan Note (Addendum)
S: Patient has been diagnosed with seronegative rheumatoid arthritis by rheumatology.  She has been placed on Humira-she states she is doing well on this medication A/P:  Stable. Continue current medications.  Continue rheumatology follow-up

## 2018-10-04 NOTE — Patient Instructions (Signed)
Health Maintenance Due  Topic Date Due  . HIV Screening  11/27/1979    Depression screen [facility] 2/9 10/04/2018 07/14/2017  Decreased Interest 1 0  Down, Depressed, Hopeless 1 0  PHQ - 2 Score 2 0  Altered sleeping 1 -  Tired, decreased energy 0 -  Change in appetite 0 -  Feeling bad or failure about yourself  1 -  Trouble concentrating 0 -  Moving slowly or fidgety/restless 0 -  Suicidal thoughts 0 -  PHQ-9 Score 4 -  Difficult doing work/chores Somewhat difficult -

## 2018-10-08 ENCOUNTER — Encounter: Payer: Self-pay | Admitting: Family Medicine

## 2018-10-27 ENCOUNTER — Other Ambulatory Visit: Payer: Self-pay | Admitting: Family Medicine

## 2018-10-27 NOTE — Telephone Encounter (Signed)
Last OV 10/04/18 Last refill 10/04/18 # 15 mL/0 Next OV not scheduled

## 2018-10-27 NOTE — Telephone Encounter (Signed)
Last OV 10/04/18 Request for 90 day supply

## 2018-12-21 ENCOUNTER — Other Ambulatory Visit: Payer: Self-pay | Admitting: *Deleted

## 2018-12-21 DIAGNOSIS — Z20822 Contact with and (suspected) exposure to covid-19: Secondary | ICD-10-CM

## 2018-12-27 LAB — NOVEL CORONAVIRUS, NAA: SARS-CoV-2, NAA: NOT DETECTED

## 2019-01-26 ENCOUNTER — Other Ambulatory Visit: Payer: Self-pay

## 2019-01-26 ENCOUNTER — Ambulatory Visit (INDEPENDENT_AMBULATORY_CARE_PROVIDER_SITE_OTHER): Payer: Commercial Managed Care - PPO | Admitting: Podiatry

## 2019-01-26 ENCOUNTER — Ambulatory Visit (INDEPENDENT_AMBULATORY_CARE_PROVIDER_SITE_OTHER): Payer: Commercial Managed Care - PPO

## 2019-01-26 ENCOUNTER — Encounter: Payer: Self-pay | Admitting: Podiatry

## 2019-01-26 VITALS — BP 139/96 | HR 81 | Temp 97.7°F | Resp 16

## 2019-01-26 DIAGNOSIS — M722 Plantar fascial fibromatosis: Secondary | ICD-10-CM

## 2019-01-26 MED ORDER — DICLOFENAC SODIUM 75 MG PO TBEC: 75.0000 mg | DELAYED_RELEASE_TABLET | Freq: Two times a day (BID) | ORAL | 2 refills | Status: DC

## 2019-01-26 NOTE — Progress Notes (Signed)
Subjective:   Patient ID: Karla Howell, female   DOB: 53 y.o.   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  Patient does not smoke likes to be active   Review of Systems  All other systems reviewed and are negative.       Objective:  Physical Exam Vitals signs and nursing note reviewed.  Constitutional:      Appearance: She is well-developed.  Pulmonary:     Effort: Pulmonary effort is normal.  Musculoskeletal: Normal range of motion.  Skin:    General: Skin is warm.  Neurological:     Mental Status: She is alert.     Neurovascular status intact muscle strength found to be adequate range of motion within normal limits.  Patient is found to have exquisite discomfort in the mid arch left and is noted on the right to have acute inflammation at the insertion of the tendon into the calcaneus.  Patient has quite a bit of pain with walking in the left heel also bothers her but not to the same degree     Assessment:  Acute insertional plantar fasciitis right and mid arch fasciitis left     Plan:  H&P discussed both conditions and today I injected the fascial insertion right 3 mg Kenalog 5 mg Xylocaine and for the left I went ahead and injected the mid arch area 3 mg Kenalog 5 mg Xylocaine and went ahead and applied fascial brace bilateral.  Gave instructions on shoe gear modifications physical therapy and placed on diclofenac 75 mg twice daily and reappoint 2 weeks  X-rays indicated no signs of plantar spur with good healthy arch height and no other pathology noted

## 2019-01-26 NOTE — Patient Instructions (Signed)

## 2019-01-26 NOTE — Progress Notes (Signed)
   Subjective:    Patient ID: Karla Howell, female    DOB: 1965/07/13, 53 y.o.   MRN: [id number]  HPI    Review of Systems  All other systems reviewed and are negative.      Objective:   Physical Exam        Assessment & Plan:

## 2019-01-27 LAB — BASIC METABOLIC PANEL
BUN: 16 (ref 4–21)
Creatinine: 1 (ref 0.5–1.1)
Glucose: 94
Potassium: 4.3 (ref 3.4–5.3)
Sodium: 141 (ref 137–147)

## 2019-01-27 LAB — CBC AND DIFFERENTIAL
HCT: 37 (ref 36–46)
Hemoglobin: 12.5 (ref 12.0–16.0)
Neutrophils Absolute: 4
Platelets: 311 (ref 150–399)
WBC: 6.3

## 2019-01-27 LAB — HEPATIC FUNCTION PANEL
ALT: 16 (ref 7–35)
AST: 18 (ref 13–35)
Alkaline Phosphatase: 88 (ref 25–125)
Bilirubin, Total: 0.4

## 2019-02-07 ENCOUNTER — Other Ambulatory Visit: Payer: Self-pay | Admitting: Family Medicine

## 2019-02-07 ENCOUNTER — Other Ambulatory Visit: Payer: Self-pay | Admitting: Obstetrics & Gynecology

## 2019-02-07 DIAGNOSIS — N6489 Other specified disorders of breast: Secondary | ICD-10-CM

## 2019-02-07 MED ORDER — ZOLPIDEM TARTRATE 5 MG PO TABS: ORAL_TABLET | ORAL | 2 refills | Status: DC

## 2019-02-07 NOTE — Telephone Encounter (Signed)
Patient would like a refill on her zolpidem (AMBIEN) 5 MG tablet medication and have it sent to her preferred pharmacy [facility] on [address].

## 2019-02-07 NOTE — Telephone Encounter (Signed)
See note

## 2019-02-07 NOTE — Telephone Encounter (Signed)
Last OV 10/04/18 Last refill 06/22/18 #30/2 Next OV not scheduled  Forwarding to Dr. Yong Channel

## 2019-02-07 NOTE — Telephone Encounter (Signed)
Requested medication (s) are due for refill today: yes  Requested medication (s) are on the active medication list: yes  Last refill:06/22/2018  Future visit scheduled: no  Notes to clinic: This refill cannot be delegated   Requested Prescriptions  Pending Prescriptions Disp Refills   zolpidem (AMBIEN) 5 MG tablet 30 tablet 2    Sig: TAKE 1 TABLET BY MOUTH AT BEDTIME AS NEEDED FOR SLEEP     Not Delegated - Psychiatry:  Anxiolytics/Hypnotics Failed - 02/07/2019  9:41 AM      Failed - This refill cannot be delegated      Failed - Urine Drug Screen completed in last 360 days.      Passed - Valid encounter within last 6 months    Recent Outpatient Visits          4 months ago Overweight   [place] Hunter, Brayton Mars, MD   9 months ago Sinusitis, unspecified chronicity, unspecified location   [place] [facility], Algis Greenhouse, MD   11 months ago Acute non-recurrent maxillary sinusitis   [place], Renee A, DO   1 year ago Generalized anxiety disorder   [place] [facility], Brayton Mars, MD   2 years ago Acute non-recurrent maxillary sinusitis   [facility] -Marilynn Latino, MD

## 2019-02-09 ENCOUNTER — Ambulatory Visit (INDEPENDENT_AMBULATORY_CARE_PROVIDER_SITE_OTHER): Payer: Commercial Managed Care - PPO | Admitting: Podiatry

## 2019-02-09 ENCOUNTER — Other Ambulatory Visit: Payer: Self-pay

## 2019-02-09 ENCOUNTER — Encounter: Payer: Self-pay | Admitting: Family Medicine

## 2019-02-09 ENCOUNTER — Encounter: Payer: Self-pay | Admitting: Podiatry

## 2019-02-09 DIAGNOSIS — M722 Plantar fascial fibromatosis: Secondary | ICD-10-CM

## 2019-02-09 NOTE — Progress Notes (Signed)
Subjective:   Patient ID: Karla Howell, female   DOB: 53 y.o.   MRN: [id number]   HPI Patient states she is improved with mild discomfort still noted in the plantar fascia but much improvement from previous visit   ROS      Objective:  Physical Exam  Neurovascular status intact negative Homans sign noted with patient's heel right and mid arch left doing much better with mild discomfort only upon deep palpation     Assessment:  Improvement plantar fasciitis bilateral with pain still upon deep palpation     Plan:  H&P reviewed both conditions and discussed at great length the etiology of this problem and the considerations for further treatment.  At this time we will get a put in on a watch and she will use her braces use supportive therapy continue physical therapy and be seen back on an as-needed basis

## 2019-02-17 ENCOUNTER — Ambulatory Visit
Admission: RE | Admit: 2019-02-17 | Discharge: 2019-02-17 | Disposition: A | Discharge status: Home / Self Care | Payer: Commercial Managed Care - PPO | Source: Ambulatory Visit | Attending: Obstetrics & Gynecology | Admitting: Obstetrics & Gynecology

## 2019-02-17 ENCOUNTER — Other Ambulatory Visit: Payer: Self-pay

## 2019-02-17 DIAGNOSIS — N6489 Other specified disorders of breast: Secondary | ICD-10-CM

## 2019-03-23 ENCOUNTER — Other Ambulatory Visit: Payer: Self-pay

## 2019-03-23 ENCOUNTER — Ambulatory Visit (INDEPENDENT_AMBULATORY_CARE_PROVIDER_SITE_OTHER): Payer: Commercial Managed Care - PPO

## 2019-03-23 ENCOUNTER — Encounter: Payer: Self-pay | Admitting: Family Medicine

## 2019-03-23 DIAGNOSIS — Z23 Encounter for immunization: Secondary | ICD-10-CM | POA: Diagnosis not present

## 2019-05-19 ENCOUNTER — Other Ambulatory Visit: Payer: Self-pay | Admitting: Family Medicine

## 2019-05-25 ENCOUNTER — Encounter: Payer: Self-pay | Admitting: Family Medicine

## 2019-05-25 ENCOUNTER — Other Ambulatory Visit: Payer: Self-pay

## 2019-05-25 MED ORDER — CITALOPRAM HYDROBROMIDE 10 MG PO TABS: 10.0000 mg | ORAL_TABLET | Freq: Every day | ORAL | 1 refills | Status: DC

## 2019-07-20 ENCOUNTER — Other Ambulatory Visit: Payer: Self-pay | Admitting: Podiatry

## 2019-07-26 ENCOUNTER — Ambulatory Visit (INDEPENDENT_AMBULATORY_CARE_PROVIDER_SITE_OTHER): Payer: Commercial Managed Care - PPO | Admitting: Family Medicine

## 2019-07-26 ENCOUNTER — Encounter: Payer: Self-pay | Admitting: Family Medicine

## 2019-07-26 ENCOUNTER — Ambulatory Visit: Payer: Commercial Managed Care - PPO | Attending: Internal Medicine

## 2019-07-26 ENCOUNTER — Other Ambulatory Visit: Payer: Self-pay

## 2019-07-26 VITALS — Temp 97.8°F | Ht 63.0 in | Wt 170.0 lb

## 2019-07-26 DIAGNOSIS — D849 Immunodeficiency, unspecified: Secondary | ICD-10-CM

## 2019-07-26 DIAGNOSIS — M0600 Rheumatoid arthritis without rheumatoid factor, unspecified site: Secondary | ICD-10-CM

## 2019-07-26 DIAGNOSIS — Z20822 Contact with and (suspected) exposure to covid-19: Secondary | ICD-10-CM

## 2019-07-26 DIAGNOSIS — J329 Chronic sinusitis, unspecified: Secondary | ICD-10-CM | POA: Diagnosis not present

## 2019-07-26 DIAGNOSIS — B9689 Other specified bacterial agents as the cause of diseases classified elsewhere: Secondary | ICD-10-CM

## 2019-07-26 MED ORDER — AMOXICILLIN-POT CLAVULANATE 875-125 MG PO TABS: 1.0000 | ORAL_TABLET | Freq: Two times a day (BID) | ORAL | 0 refills | Status: DC

## 2019-07-26 NOTE — Patient Instructions (Signed)
Health Maintenance Due  Topic Date Due  . HIV Screening -discuss at OV 11/27/1979   Depression screen [facility] 2/9 10/04/2018 07/14/2017  Decreased Interest 1 0  Down, Depressed, Hopeless 1 0  PHQ - 2 Score 2 0  Altered sleeping 1 -  Tired, decreased energy 0 -  Change in appetite 0 -  Feeling bad or failure about yourself  1 -  Trouble concentrating 0 -  Moving slowly or fidgety/restless 0 -  Suicidal thoughts 0 -  PHQ-9 Score 4 -  Difficult doing work/chores Somewhat difficult -

## 2019-07-26 NOTE — Progress Notes (Signed)
Phone [phone] Virtual visit via Video note   Subjective:  Chief complaint: Chief Complaint  Patient presents with  . virtual  . allergy/sinus issues   This visit type was conducted due to national recommendations for restrictions regarding the COVID-19 Pandemic (e.g. social distancing).  This format is felt to be most appropriate for this patient at this time balancing risks to patient and risks to population by having him in for in person visit.  No physical exam was performed (except for noted visual exam or audio findings with Telehealth visits).    Our team/I connected with Karla Howell at 11:20 AM EST by a video enabled telemedicine application (doxy.me or caregility through epic) and verified that I am speaking with the correct person using two identifiers.  Location patient: [place] Location provider: [facility], office Persons participating in the virtual visit:  patient  Our team/I discussed the limitations of evaluation and management by telemedicine and the availability of in person appointments. In light of current covid-19 pandemic, patient also understands that we are trying to protect them by minimizing in office contact if at all possible.  The patient expressed consent for telemedicine visit and agreed to proceed. Patient understands insurance will be billed.   Past Medical History-  Patient Active Problem List   Diagnosis Date Noted  . Rheumatoid arthritis ([place]) 10/04/2018    Priority: High  . Essential hypertension 07/19/2014    Priority: Medium  . Generalized anxiety disorder 03/15/2014    Priority: Medium  . IBS 10/25/2009    Priority: Medium  . GERD (gastroesophageal reflux disease) 10/04/2018    Priority: Low  . Insomnia 10/04/2018    Priority: Low  . H/O total hysterectomy 03/27/2018    Priority: Low  . Allergic rhinitis 03/26/2013    Priority: Low  . Anemia 01/21/2012    Priority: Low  . Acute non-recurrent maxillary sinusitis  09/27/2016    Medications- reviewed and updated Current Outpatient Medications  Medication Sig Dispense Refill  . citalopram (CELEXA) 10 MG tablet Take 1 tablet (10 mg total) by mouth daily. 90 tablet 1  . diclofenac (VOLTAREN) 75 MG EC tablet TAKE 1 TABLET BY MOUTH TWICE A DAY 50 tablet 2  . HUMIRA PEN 40 MG/0.4ML PNKT Inject 1 mL into the muscle every 14 (fourteen) days.     . hydrOXYzine (ATARAX/VISTARIL) 10 MG tablet Take 1 tablet (10 mg total) by mouth 3 (three) times daily as needed for anxiety. 30 tablet 2  . ipratropium (ATROVENT) 0.06 % nasal spray PLACE 2 SPRAYS INTO BOTH NOSTRILS 4 (FOUR) TIMES DAILY. 15 mL 5  . Multiple Vitamin (MULTIVITAMIN) tablet Take 1 tablet by mouth daily.    . vitamin C (ASCORBIC ACID) 500 MG tablet Take 500 mg by mouth daily. Reported on 10/02/2015    . ZINC GLUCONATE PO Take by mouth.    . zolpidem (AMBIEN) 5 MG tablet TAKE 1 TABLET BY MOUTH AT BEDTIME AS NEEDED FOR SLEEP 30 tablet 2  . amoxicillin-clavulanate (AUGMENTIN) 875-125 MG tablet Take 1 tablet by mouth 2 (two) times daily. 20 tablet 0   No current facility-administered medications for this visit.     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 Mainly clear discharge. Occasional twinge of blood.Patient uses has allergies in spring or summer- typically does not have issues in winter.  She also is having some headaches, itchy nose, dry  nose as well as occasional cough. Pt denies fever, loss of taste of smell. She has not had a recent covid test. She takes claritin and Xyzal daily and OTC afrin, flonase and saline washes which do not seem to work. No blurry vision.  A/P: Sinus congestion and pressure for over a month not in her normal allergy season.  In addition patient is somewhat immunocompromised given long-term use of Humira.  We opted to treat with course of Augmentin to cover for bacterial sinusitis for 10 days.  If she fails to improve would consider something with more atypical coverage such as doxycycline or azithromycin which would also cover for pneumonia-doubt pneumonia with no shortness of breath and sparing cough though. -Occasionally gets yeast infections after antibiotics but does okay with over-the-counter creams-told her I could send in Diflucan if needed   also Patient with symptoms concerning for potential covid 19 Therefore: -information provided on testing scheduling "please text "COVID" to [phone], OR you can log on to [email or web address] to easily make an on-line appointment. "  - recommended patient watch closely for shortness of breath or confusion or worsening symptoms and if those occur he should contact us immediately  -recommended self isolation until negative test  at minimum .  - for quarantine if covid 19 test positive would need to be at least 10 days since first symptom AND at least 24 hours fever free without fever reducing medications AND improvement in respiratory symptoms  - we also discussed close contacts would need 14 day quarantine after last close contact with patient IF patient is positive  -Hopeful results back within 48 hours of test  - still out of work- doesn't need work note  #Rheumatoid arthritis/immunocompromised S: Patient continues follow-up with rheumatology and remains on Humira.  Reasonable control of her symptoms.  She is immunocompromise as a result as above A/P:  Rheumatoid arthritis is stable on Humira.  As above immunosuppressed as her so reasonable to be more aggressive about potential bacterial infections  Lab/Order associations:   ICD-10-CM   1. Bacterial sinusitis  J32.9    B96.89   2. Rheumatoid arthritis with negative rheumatoid factor, involving unspecified site ([place])  M06.00   3. Immunocompromised ([place])  D84.9     Meds ordered this encounter  Medications  . amoxicillin-clavulanate (AUGMENTIN) 875-125 MG tablet    Sig: Take 1 tablet by mouth 2 (two) times daily.    Dispense:  20 tablet    Refill:  0   Return precautions advised.  Garret Reddish, MD

## 2019-07-27 LAB — NOVEL CORONAVIRUS, NAA: SARS-CoV-2, NAA: NOT DETECTED

## 2019-08-25 ENCOUNTER — Ambulatory Visit: Payer: Commercial Managed Care - PPO | Attending: Family

## 2019-08-25 DIAGNOSIS — Z23 Encounter for immunization: Secondary | ICD-10-CM

## 2019-08-25 NOTE — Progress Notes (Signed)
   Covid-19 Vaccination Clinic  Name:  Karla Howell    MRN: [id number] DOB: [birth year]  08/25/2019  Karla Howell was observed post Covid-19 immunization for 15 minutes without incident. She was provided with Vaccine Information Sheet and instruction to access the V-Safe system.   Karla Howell was instructed to call 911 with any severe reactions post vaccine: [name] Kitchen Difficulty breathing  . Swelling of face and throat  . A fast heartbeat  . A bad rash all over body  . Dizziness and weakness   Immunizations Administered    Name Date Dose VIS Date Route   Moderna COVID-19 Vaccine 08/25/2019  4:16 PM 0.5 mL 05/10/2019 Intramuscular   Manufacturer: Moderna   Lot: [id number]   [id number]

## 2019-09-14 ENCOUNTER — Other Ambulatory Visit: Payer: Self-pay | Admitting: Family Medicine

## 2019-09-15 NOTE — Telephone Encounter (Signed)
LAST APPOINTMENT DATE: 07/26/2019 NEXT APPOINTMENT DATE:@ no f/u   LAST REFILL: 02/07/2020  QTY: #30 2 RF

## 2019-09-27 ENCOUNTER — Ambulatory Visit: Payer: Commercial Managed Care - PPO | Attending: Family

## 2019-09-27 DIAGNOSIS — Z23 Encounter for immunization: Secondary | ICD-10-CM

## 2019-09-27 NOTE — Progress Notes (Signed)
   Covid-19 Vaccination Clinic  Name:  Karla Howell    MRN: [id number] DOB: [birth year]  09/27/2019  Ms. Karla Howell was observed post Covid-19 immunization for 15 minutes without incident. She was provided with Vaccine Information Sheet and instruction to access the V-Safe system.   Ms. Karla Howell was instructed to call 911 with any severe reactions post vaccine: [name] Kitchen Difficulty breathing  . Swelling of face and throat  . A fast heartbeat  . A bad rash all over body  . Dizziness and weakness   Immunizations Administered    Name Date Dose VIS Date Route   Moderna COVID-19 Vaccine 09/27/2019  4:03 PM 0.5 mL 05/2019 Intramuscular   Manufacturer: Moderna   Lot: [id number]   [id number]

## 2019-12-07 ENCOUNTER — Encounter: Payer: Self-pay | Admitting: Podiatry

## 2019-12-07 ENCOUNTER — Ambulatory Visit (INDEPENDENT_AMBULATORY_CARE_PROVIDER_SITE_OTHER): Payer: Commercial Managed Care - PPO | Admitting: Podiatry

## 2019-12-07 ENCOUNTER — Other Ambulatory Visit: Payer: Self-pay

## 2019-12-07 VITALS — Temp 97.1°F

## 2019-12-07 DIAGNOSIS — M722 Plantar fascial fibromatosis: Secondary | ICD-10-CM | POA: Diagnosis not present

## 2019-12-07 NOTE — Progress Notes (Signed)
Subjective:   Patient ID: Karla Howell, female   DOB: 54 y.o.   MRN: [id number]   HPI Patient presents stating she is having a lot of pain in her right heel over left and states is been going on around 4 months.  Does not remember injury and it used to be much worse than the left over the right   ROS      Objective:  Physical Exam  Neurovascular status intact exquisite discomfort heel right over left with inflammation fluid medial band with patient noted to have moderate depression of the arch     Assessment:  Acute reoccurrence plantar fasciitis right with exquisite discomfort noted upon the medial band     Plan:  H&P reviewed condition and at this point I went ahead and injected the fascia right 3 mg Kenalog 5 mg Xylocaine dispensed a night splint with all instructions on usage and instructed on reduced activity.  Patient will be seen back and was given all instructions on stretching exercises

## 2019-12-07 NOTE — Patient Instructions (Signed)

## 2019-12-25 ENCOUNTER — Other Ambulatory Visit: Payer: Self-pay | Admitting: Family Medicine

## 2020-01-05 ENCOUNTER — Ambulatory Visit: Payer: Commercial Managed Care - PPO | Admitting: Podiatry

## 2020-02-22 DIAGNOSIS — M150 Primary generalized (osteo)arthritis: Secondary | ICD-10-CM | POA: Diagnosis not present

## 2020-02-22 DIAGNOSIS — Z7189 Other specified counseling: Secondary | ICD-10-CM | POA: Diagnosis not present

## 2020-02-22 DIAGNOSIS — M0600 Rheumatoid arthritis without rheumatoid factor, unspecified site: Secondary | ICD-10-CM | POA: Diagnosis not present

## 2020-02-22 DIAGNOSIS — R5383 Other fatigue: Secondary | ICD-10-CM | POA: Diagnosis not present

## 2020-03-02 DIAGNOSIS — H1045 Other chronic allergic conjunctivitis: Secondary | ICD-10-CM | POA: Diagnosis not present

## 2020-03-02 DIAGNOSIS — J301 Allergic rhinitis due to pollen: Secondary | ICD-10-CM | POA: Diagnosis not present

## 2020-03-02 DIAGNOSIS — J309 Allergic rhinitis, unspecified: Secondary | ICD-10-CM | POA: Diagnosis not present

## 2020-04-09 DIAGNOSIS — Z1213 Encounter for screening for malignant neoplasm of small intestine: Secondary | ICD-10-CM | POA: Diagnosis not present

## 2020-04-09 DIAGNOSIS — Z01419 Encounter for gynecological examination (general) (routine) without abnormal findings: Secondary | ICD-10-CM | POA: Diagnosis not present

## 2020-04-09 DIAGNOSIS — Z1231 Encounter for screening mammogram for malignant neoplasm of breast: Secondary | ICD-10-CM | POA: Diagnosis not present

## 2020-04-09 DIAGNOSIS — Z6830 Body mass index (BMI) 30.0-30.9, adult: Secondary | ICD-10-CM | POA: Diagnosis not present

## 2020-04-22 ENCOUNTER — Ambulatory Visit: Payer: Commercial Managed Care - PPO | Attending: Internal Medicine

## 2020-04-22 DIAGNOSIS — Z23 Encounter for immunization: Secondary | ICD-10-CM

## 2020-04-22 NOTE — Progress Notes (Signed)
° °  Covid-19 Vaccination Clinic  Name:  Karla Howell    MRN: [id number] DOB: 1965-09-30  04/22/2020  [name] was observed post Covid-19 immunization for 15 minutes without incident. She was provided with Vaccine Information Sheet and instruction to access the V-Safe system.   [name] was instructed to call 911 with any severe reactions post vaccine:  Difficulty breathing   Swelling of face and throat   A fast heartbeat   A bad rash all over body   Dizziness and weakness   Immunizations Administered    No immunizations on file.

## 2020-04-23 ENCOUNTER — Encounter: Payer: Self-pay | Admitting: Family Medicine

## 2020-04-25 NOTE — Patient Instructions (Addendum)
Health Maintenance Due  Topic Date Due  . Hepatitis C Screening  Never done  . HIV Screening  Never done  . INFLUENZA VACCINE Has not received yet  01/08/2020   Depression screen [facility] 2/9 07/26/2019 10/04/2018 07/14/2017  Decreased Interest 0 1 0  Down, Depressed, Hopeless 0 1 0  PHQ - 2 Score 0 2 0  Altered sleeping 3 1 -  Tired, decreased energy 0 0 -  Change in appetite 0 0 -  Feeling bad or failure about yourself  0 1 -  Trouble concentrating 0 0 -  Moving slowly or fidgety/restless 1 0 -  Suicidal thoughts 0 0 -  PHQ-9 Score 4 4 -  Difficult doing work/chores Not difficult at all Somewhat difficult -

## 2020-04-25 NOTE — Progress Notes (Signed)
Phone [phone] Virtual visit via Video note   Subjective:  Chief complaint: Chief Complaint  Patient presents with  . Anxiety   This visit type was conducted due to national recommendations for restrictions regarding the COVID-19 Pandemic (e.g. social distancing).  This format is felt to be most appropriate for this patient at this time balancing risks to patient and risks to population by having him in for in person visit.  No physical exam was performed (except for noted visual exam or audio findings with Telehealth visits).    Our team/I connected with Karla Howell at 11:20 AM EST by a video enabled telemedicine application ([email or web address] or caregility through epic) and verified that I am speaking with the correct person using two identifiers.  Location patient: Home[place] Location provider: [facility], office Persons participating in the virtual visit:  patient  Our team/I discussed the limitations of evaluation and management by telemedicine and the availability of in person appointments. In light of current covid-19 pandemic, patient also understands that we are trying to protect them by minimizing in office contact if at all possible.  The patient expressed consent for telemedicine visit and agreed to proceed. Patient understands insurance will be billed.   Past Medical History-  Patient Active Problem List   Diagnosis Date Noted  . Rheumatoid arthritis ([place]) 10/04/2018    Priority: High  . Essential hypertension 07/19/2014    Priority: Medium  . Generalized anxiety disorder 03/15/2014    Priority: Medium  . IBS 10/25/2009    Priority: Medium  . GERD (gastroesophageal reflux disease) 10/04/2018    Priority: Low  . Insomnia 10/04/2018    Priority: Low  . H/O total hysterectomy 03/27/2018    Priority: Low  . Allergic rhinitis 03/26/2013    Priority: Low  . Anemia 01/21/2012    Priority: Low  . Acute non-recurrent maxillary sinusitis 09/27/2016    Medications-  reviewed and updated Current Outpatient Medications  Medication Sig Dispense Refill  . azelastine (ASTELIN) 0.1 % nasal spray Place into both nostrils as needed.    [name] Kitchen azelastine (OPTIVAR) 0.05 % ophthalmic solution Place 1 drop into both eyes as needed.    . citalopram (CELEXA) 10 MG tablet TAKE 1 TABLET BY MOUTH EVERY DAY 30 tablet 5  . HUMIRA PEN 40 MG/0.4ML PNKT Inject 1 mL into the muscle every 14 (fourteen) days.     . montelukast (SINGULAIR) 10 MG tablet Take 10 mg by mouth daily.    . Multiple Vitamin (MULTIVITAMIN) tablet Take 1 tablet by mouth daily.    . vitamin C (ASCORBIC ACID) 500 MG tablet Take 500 mg by mouth daily. Reported on 10/02/2015    . ZINC GLUCONATE PO Take by mouth.    . zolpidem (AMBIEN) 5 MG tablet Take 1 tablet (5 mg total) by mouth at bedtime as needed. for sleep 30 tablet 5  . diclofenac (VOLTAREN) 75 MG EC tablet TAKE 1 TABLET BY MOUTH TWICE A DAY (Patient not taking: Reported on 04/26/2020) 50 tablet 2   No current facility-administered medications for this visit.     Objective:  LMP 01/06/2013  self reported vitals Gen: NAD, resting comfortably Lungs: nonlabored, normal respiratory rate  Skin: appears dry, no obvious rash     Assessment and Plan   # Anxiety/GAD S:Medication: citalopram 10mg  . No SI.  Counseling: has seen Lattie Haw in the past but not since early last year.  GAD 7 : Generalized Anxiety Score 04/26/2020  Nervous, Anxious, on Edge 2  Control/stop  worrying 0  Worry too much - different things 0  Trouble relaxing 0  Restless 1  Easily annoyed or irritable 0  Afraid - awful might happen 0  Total GAD 7 Score 3  Anxiety Difficulty Not difficult at all   Working locally and no ntravel and really loving that. Prior work transitions were stressful for her and had lost job in pandemic but in a much better place. Her sister is doing better after divorce.   Dealing with menopause- some focus issues.  Hot flashes have not been bad  A/P: Patient  wanted to discuss potentially stopping her citalopram 10 mg.  She was concerned given her transition into menopause about stopping at this point-ultimately she decided to hold off for now making any changes.  We discussed its reasonable to continue SSRI long-term-particularly since she had to restart medicine last time this was stopped. -I did give her flexibility to medication in half and try that for 3 months if she would like and then update me before stopping the medicine  #Insomnia S: Patient continues only get 2 or 3 hours of sleep at night without Ambien-with Ambien she is able to rest much more comfortably A/P: Reasonable control-refilled Ambien 5 mg for 6 additional months   # Calf Muscle Concerns  S: walking down stairs at work yesterday- heard a pop in her left calf muscle. No bruising on back of calf. Limping some today. Pain up to 10/10.  A/P: I am concerned patient may have torn her calf muscle or other serious injury -recommended sports medicine orthopedics evaluation-she has seen Raliegh Ip in the past and agrees to call them today-I would like for her to ideally be seen today if possible  Recommended follow up: 4-6 month physical   Lab/Order associations:   ICD-10-CM   1. Generalized anxiety disorder  F41.1   2. Insomnia, unspecified type  G47.00   3. Calf pain, unspecified laterality  M79.669     Meds ordered this encounter  Medications  . zolpidem (AMBIEN) 5 MG tablet    Sig: Take 1 tablet (5 mg total) by mouth at bedtime as needed. for sleep    Dispense:  30 tablet    Refill:  5    This request is for a new prescription for a controlled substance as required by Federal/State law.   Return precautions advised.  Garret Reddish, MD

## 2020-04-26 ENCOUNTER — Encounter: Payer: Self-pay | Admitting: Family Medicine

## 2020-04-26 ENCOUNTER — Other Ambulatory Visit: Payer: Self-pay

## 2020-04-26 ENCOUNTER — Telehealth (INDEPENDENT_AMBULATORY_CARE_PROVIDER_SITE_OTHER): Payer: BC Managed Care – PPO | Admitting: Family Medicine

## 2020-04-26 DIAGNOSIS — F411 Generalized anxiety disorder: Secondary | ICD-10-CM | POA: Diagnosis not present

## 2020-04-26 DIAGNOSIS — G4700 Insomnia, unspecified: Secondary | ICD-10-CM | POA: Diagnosis not present

## 2020-04-26 DIAGNOSIS — M79669 Pain in unspecified lower leg: Secondary | ICD-10-CM

## 2020-04-26 MED ORDER — ZOLPIDEM TARTRATE 5 MG PO TABS: 5.0000 mg | ORAL_TABLET | Freq: Every evening | ORAL | 5 refills | Status: DC | PRN

## 2020-04-30 DIAGNOSIS — S86112A Strain of other muscle(s) and tendon(s) of posterior muscle group at lower leg level, left leg, initial encounter: Secondary | ICD-10-CM | POA: Diagnosis not present

## 2020-05-07 DIAGNOSIS — M79605 Pain in left leg: Secondary | ICD-10-CM | POA: Diagnosis not present

## 2020-05-07 DIAGNOSIS — S86112D Strain of other muscle(s) and tendon(s) of posterior muscle group at lower leg level, left leg, subsequent encounter: Secondary | ICD-10-CM | POA: Diagnosis not present

## 2020-05-07 DIAGNOSIS — R262 Difficulty in walking, not elsewhere classified: Secondary | ICD-10-CM | POA: Diagnosis not present

## 2020-05-07 DIAGNOSIS — M6281 Muscle weakness (generalized): Secondary | ICD-10-CM | POA: Diagnosis not present

## 2020-05-16 DIAGNOSIS — R262 Difficulty in walking, not elsewhere classified: Secondary | ICD-10-CM | POA: Diagnosis not present

## 2020-05-16 DIAGNOSIS — S86112D Strain of other muscle(s) and tendon(s) of posterior muscle group at lower leg level, left leg, subsequent encounter: Secondary | ICD-10-CM | POA: Diagnosis not present

## 2020-05-16 DIAGNOSIS — M79605 Pain in left leg: Secondary | ICD-10-CM | POA: Diagnosis not present

## 2020-05-16 DIAGNOSIS — M6281 Muscle weakness (generalized): Secondary | ICD-10-CM | POA: Diagnosis not present

## 2020-05-23 DIAGNOSIS — M6281 Muscle weakness (generalized): Secondary | ICD-10-CM | POA: Diagnosis not present

## 2020-05-23 DIAGNOSIS — S39012D Strain of muscle, fascia and tendon of lower back, subsequent encounter: Secondary | ICD-10-CM | POA: Diagnosis not present

## 2020-05-23 DIAGNOSIS — M545 Low back pain, unspecified: Secondary | ICD-10-CM | POA: Diagnosis not present

## 2020-05-30 DIAGNOSIS — M6281 Muscle weakness (generalized): Secondary | ICD-10-CM | POA: Diagnosis not present

## 2020-05-30 DIAGNOSIS — M545 Low back pain, unspecified: Secondary | ICD-10-CM | POA: Diagnosis not present

## 2020-05-30 DIAGNOSIS — S39012D Strain of muscle, fascia and tendon of lower back, subsequent encounter: Secondary | ICD-10-CM | POA: Diagnosis not present

## 2020-06-05 DIAGNOSIS — M545 Low back pain, unspecified: Secondary | ICD-10-CM | POA: Diagnosis not present

## 2020-06-05 DIAGNOSIS — M6281 Muscle weakness (generalized): Secondary | ICD-10-CM | POA: Diagnosis not present

## 2020-06-05 DIAGNOSIS — S39012D Strain of muscle, fascia and tendon of lower back, subsequent encounter: Secondary | ICD-10-CM | POA: Diagnosis not present

## 2020-06-12 DIAGNOSIS — M545 Low back pain, unspecified: Secondary | ICD-10-CM | POA: Diagnosis not present

## 2020-06-12 DIAGNOSIS — S39012D Strain of muscle, fascia and tendon of lower back, subsequent encounter: Secondary | ICD-10-CM | POA: Diagnosis not present

## 2020-06-12 DIAGNOSIS — M6281 Muscle weakness (generalized): Secondary | ICD-10-CM | POA: Diagnosis not present

## 2020-06-26 DIAGNOSIS — M6281 Muscle weakness (generalized): Secondary | ICD-10-CM | POA: Diagnosis not present

## 2020-06-26 DIAGNOSIS — S39012D Strain of muscle, fascia and tendon of lower back, subsequent encounter: Secondary | ICD-10-CM | POA: Diagnosis not present

## 2020-06-26 DIAGNOSIS — M545 Low back pain, unspecified: Secondary | ICD-10-CM | POA: Diagnosis not present

## 2020-07-04 DIAGNOSIS — M6281 Muscle weakness (generalized): Secondary | ICD-10-CM | POA: Diagnosis not present

## 2020-07-04 DIAGNOSIS — S39012D Strain of muscle, fascia and tendon of lower back, subsequent encounter: Secondary | ICD-10-CM | POA: Diagnosis not present

## 2020-07-04 DIAGNOSIS — M545 Low back pain, unspecified: Secondary | ICD-10-CM | POA: Diagnosis not present

## 2020-07-11 DIAGNOSIS — S39012D Strain of muscle, fascia and tendon of lower back, subsequent encounter: Secondary | ICD-10-CM | POA: Diagnosis not present

## 2020-07-11 DIAGNOSIS — M6281 Muscle weakness (generalized): Secondary | ICD-10-CM | POA: Diagnosis not present

## 2020-07-11 DIAGNOSIS — M545 Low back pain, unspecified: Secondary | ICD-10-CM | POA: Diagnosis not present

## 2020-07-16 DIAGNOSIS — M6281 Muscle weakness (generalized): Secondary | ICD-10-CM | POA: Diagnosis not present

## 2020-07-16 DIAGNOSIS — M545 Low back pain, unspecified: Secondary | ICD-10-CM | POA: Diagnosis not present

## 2020-07-16 DIAGNOSIS — S39012D Strain of muscle, fascia and tendon of lower back, subsequent encounter: Secondary | ICD-10-CM | POA: Diagnosis not present

## 2020-07-25 DIAGNOSIS — S39012D Strain of muscle, fascia and tendon of lower back, subsequent encounter: Secondary | ICD-10-CM | POA: Diagnosis not present

## 2020-07-25 DIAGNOSIS — M6281 Muscle weakness (generalized): Secondary | ICD-10-CM | POA: Diagnosis not present

## 2020-07-25 DIAGNOSIS — M545 Low back pain, unspecified: Secondary | ICD-10-CM | POA: Diagnosis not present

## 2020-07-27 ENCOUNTER — Other Ambulatory Visit: Payer: Self-pay | Admitting: Family Medicine

## 2020-07-30 DIAGNOSIS — S39012D Strain of muscle, fascia and tendon of lower back, subsequent encounter: Secondary | ICD-10-CM | POA: Diagnosis not present

## 2020-07-30 DIAGNOSIS — M6281 Muscle weakness (generalized): Secondary | ICD-10-CM | POA: Diagnosis not present

## 2020-07-30 DIAGNOSIS — M545 Low back pain, unspecified: Secondary | ICD-10-CM | POA: Diagnosis not present

## 2020-08-06 DIAGNOSIS — M6281 Muscle weakness (generalized): Secondary | ICD-10-CM | POA: Diagnosis not present

## 2020-08-06 DIAGNOSIS — M545 Low back pain, unspecified: Secondary | ICD-10-CM | POA: Diagnosis not present

## 2020-08-06 DIAGNOSIS — S39012D Strain of muscle, fascia and tendon of lower back, subsequent encounter: Secondary | ICD-10-CM | POA: Diagnosis not present

## 2020-08-15 DIAGNOSIS — M6281 Muscle weakness (generalized): Secondary | ICD-10-CM | POA: Diagnosis not present

## 2020-08-15 DIAGNOSIS — S39012D Strain of muscle, fascia and tendon of lower back, subsequent encounter: Secondary | ICD-10-CM | POA: Diagnosis not present

## 2020-08-15 DIAGNOSIS — M545 Low back pain, unspecified: Secondary | ICD-10-CM | POA: Diagnosis not present

## 2020-08-22 DIAGNOSIS — Z7189 Other specified counseling: Secondary | ICD-10-CM | POA: Diagnosis not present

## 2020-08-22 DIAGNOSIS — R5383 Other fatigue: Secondary | ICD-10-CM | POA: Diagnosis not present

## 2020-08-22 DIAGNOSIS — M150 Primary generalized (osteo)arthritis: Secondary | ICD-10-CM | POA: Diagnosis not present

## 2020-08-22 DIAGNOSIS — M0600 Rheumatoid arthritis without rheumatoid factor, unspecified site: Secondary | ICD-10-CM | POA: Diagnosis not present

## 2020-09-11 ENCOUNTER — Other Ambulatory Visit: Payer: Self-pay

## 2020-09-11 ENCOUNTER — Encounter: Payer: Self-pay | Admitting: Family Medicine

## 2020-09-11 ENCOUNTER — Telehealth (INDEPENDENT_AMBULATORY_CARE_PROVIDER_SITE_OTHER): Payer: BC Managed Care – PPO | Admitting: Family

## 2020-09-11 ENCOUNTER — Encounter: Payer: Self-pay | Admitting: Family

## 2020-09-11 VITALS — Ht 63.0 in | Wt 170.0 lb

## 2020-09-11 DIAGNOSIS — J019 Acute sinusitis, unspecified: Secondary | ICD-10-CM

## 2020-09-11 DIAGNOSIS — J3489 Other specified disorders of nose and nasal sinuses: Secondary | ICD-10-CM

## 2020-09-11 DIAGNOSIS — B9689 Other specified bacterial agents as the cause of diseases classified elsewhere: Secondary | ICD-10-CM | POA: Diagnosis not present

## 2020-09-11 DIAGNOSIS — J302 Other seasonal allergic rhinitis: Secondary | ICD-10-CM | POA: Diagnosis not present

## 2020-09-11 MED ORDER — AMOXICILLIN 500 MG PO CAPS: 500.0000 mg | ORAL_CAPSULE | Freq: Two times a day (BID) | ORAL | 0 refills | Status: DC

## 2020-09-11 NOTE — Progress Notes (Signed)
Virtual Visit via Video   I connected with patient on 09/11/20 at  3:40 PM EDT by a video enabled telemedicine application and verified that I am speaking with the correct person using two identifiers.  Location patient: Home Location provider: [place], Office Persons participating in the virtual visit: Karla Howell/Karla Howell  I discussed the limitations of evaluation and management by telemedicine and the availability of in person appointments. The patient expressed understanding and agreed to proceed.  Subjective:   HPI:  55 year old African-American female is in today via video visit with complaints of sneezing, cough, congestion, low-grade fever of 99.6 x 6 days and worsening.  She reports taken over-the-counter medications that have not helped much.  She sees an allergy specialist and routinely takes Astelin and Optivar.  She has been vaccinated against Covid as well as boosted.  Takes Humira.  ROS:   See pertinent positives and negatives per HPI.  Patient Active Problem List   Diagnosis Date Noted  . GERD (gastroesophageal reflux disease) 10/04/2018  . Insomnia 10/04/2018  . Rheumatoid arthritis ([place]) 10/04/2018  . H/O total hysterectomy 03/27/2018  . Acute non-recurrent maxillary sinusitis 09/27/2016  . Essential hypertension 07/19/2014  . Generalized anxiety disorder 03/15/2014  . Allergic rhinitis 03/26/2013  . Anemia 01/21/2012  . IBS 10/25/2009    Social History   Tobacco Use  . Smoking status: Never Smoker  . Smokeless tobacco: Never Used  Substance Use Topics  . Alcohol use: Yes    Alcohol/week: 7.0 standard drinks    Types: 7 Standard drinks or equivalent per week    Comment: socially    Current Outpatient Medications:  .  amoxicillin (AMOXIL) 500 MG capsule, Take 1 capsule (500 mg total) by mouth 2 (two) times daily., Disp: 14 capsule, Rfl: 0 .  azelastine (ASTELIN) 0.1 % nasal spray, Place into both nostrils as needed., Disp: ,  Rfl:  .  azelastine (OPTIVAR) 0.05 % ophthalmic solution, Place 1 drop into both eyes as needed., Disp: , Rfl:  .  celecoxib (CELEBREX) 200 MG capsule, Take 200 mg by mouth daily., Disp: , Rfl:  .  citalopram (CELEXA) 10 MG tablet, TAKE 1 TABLET BY MOUTH EVERY DAY, Disp: 90 tablet, Rfl: 0 .  HUMIRA PEN 40 MG/0.4ML PNKT, Inject 1 mL into the muscle every 14 (fourteen) days. , Disp: , Rfl:  .  montelukast (SINGULAIR) 10 MG tablet, Take 10 mg by mouth daily., Disp: , Rfl:  .  Multiple Vitamin (MULTIVITAMIN) tablet, Take 1 tablet by mouth daily., Disp: , Rfl:  .  vitamin C (ASCORBIC ACID) 500 MG tablet, Take 500 mg by mouth daily. Reported on 10/02/2015, Disp: , Rfl:  .  ZINC GLUCONATE PO, Take by mouth., Disp: , Rfl:  .  zolpidem (AMBIEN) 5 MG tablet, Take 1 tablet (5 mg total) by mouth at bedtime as needed. for sleep, Disp: 30 tablet, Rfl: 5 .  diclofenac (VOLTAREN) 75 MG EC tablet, TAKE 1 TABLET BY MOUTH TWICE A DAY (Patient not taking: No sig reported), Disp: 50 tablet, Rfl: 2  No Known Allergies  Objective:   Ht 5\' 3"  (1.6 m)   Wt 169 lb 15.6 oz (77.1 kg)   LMP 01/06/2013   BMI 30.11 kg/m   Patient is well-developed, well-nourished in no acute distress.  Resting comfortably at home.  Head is normocephalic, atraumatic.  No labored breathing.  Speech is clear and coherent with logical content.  Patient is alert and oriented at baseline.  Assessment and Plan:    Karla Howell was seen today for sinus problem and cough.  Diagnoses and all orders for this visit:  Acute bacterial sinusitis  Seasonal allergies  Frontal sinus pain  Other orders -     amoxicillin (AMOXIL) 500 MG capsule; Take 1 capsule (500 mg total) by mouth 2 (two) times daily.  Encouraged Humidifier, continue Astelin and Optivar as needed, Tylenol for fever as needed. Call if symptoms worsen or persist.    Kennyth Arnold, FNP 09/11/2020

## 2020-09-11 NOTE — Patient Instructions (Signed)

## 2020-10-27 ENCOUNTER — Other Ambulatory Visit: Payer: Self-pay | Admitting: Family Medicine

## 2020-11-18 ENCOUNTER — Other Ambulatory Visit: Payer: Self-pay | Admitting: Family Medicine

## 2021-01-07 ENCOUNTER — Other Ambulatory Visit: Payer: Self-pay | Admitting: Family Medicine

## 2021-01-08 NOTE — Telephone Encounter (Signed)
LAST APPOINTMENT DATE: 11/18/2020   NEXT APPOINTMENT DATE: Visit date not found    LAST REFILL: 06/138/2022  QTY: 30

## 2021-02-04 ENCOUNTER — Other Ambulatory Visit: Payer: Self-pay | Admitting: Family Medicine

## 2021-02-18 ENCOUNTER — Other Ambulatory Visit: Payer: Self-pay | Admitting: Family Medicine

## 2021-03-06 DIAGNOSIS — Z79899 Other long term (current) drug therapy: Secondary | ICD-10-CM | POA: Diagnosis not present

## 2021-03-06 DIAGNOSIS — M0600 Rheumatoid arthritis without rheumatoid factor, unspecified site: Secondary | ICD-10-CM | POA: Diagnosis not present

## 2021-03-06 DIAGNOSIS — R5383 Other fatigue: Secondary | ICD-10-CM | POA: Diagnosis not present

## 2021-03-06 DIAGNOSIS — M150 Primary generalized (osteo)arthritis: Secondary | ICD-10-CM | POA: Diagnosis not present

## 2021-03-27 ENCOUNTER — Other Ambulatory Visit: Payer: Self-pay | Admitting: Family Medicine

## 2021-03-28 NOTE — Telephone Encounter (Signed)
Last refill: 01/08/21 #30, 1 Last OV: 04/26/20 dx. Anxiety
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
## 2021-06-10 ENCOUNTER — Encounter: Payer: Self-pay | Admitting: Family Medicine

## 2021-06-11 DIAGNOSIS — Z01419 Encounter for gynecological examination (general) (routine) without abnormal findings: Secondary | ICD-10-CM | POA: Diagnosis not present

## 2021-06-11 DIAGNOSIS — Z6830 Body mass index (BMI) 30.0-30.9, adult: Secondary | ICD-10-CM | POA: Diagnosis not present

## 2021-06-11 DIAGNOSIS — Z1231 Encounter for screening mammogram for malignant neoplasm of breast: Secondary | ICD-10-CM | POA: Diagnosis not present

## 2021-06-11 LAB — HM MAMMOGRAPHY

## 2021-06-18 ENCOUNTER — Other Ambulatory Visit: Payer: Self-pay | Admitting: Family Medicine

## 2021-06-19 ENCOUNTER — Telehealth (INDEPENDENT_AMBULATORY_CARE_PROVIDER_SITE_OTHER): Payer: BC Managed Care – PPO | Admitting: Physician Assistant

## 2021-06-19 ENCOUNTER — Telehealth: Payer: Self-pay | Admitting: *Deleted

## 2021-06-19 ENCOUNTER — Encounter: Payer: Self-pay | Admitting: Physician Assistant

## 2021-06-19 VITALS — Ht 63.0 in | Wt 172.0 lb

## 2021-06-19 DIAGNOSIS — J0100 Acute maxillary sinusitis, unspecified: Secondary | ICD-10-CM

## 2021-06-19 MED ORDER — AMOXICILLIN-POT CLAVULANATE 875-125 MG PO TABS: 1.0000 | ORAL_TABLET | Freq: Two times a day (BID) | ORAL | 0 refills | Status: DC

## 2021-06-19 NOTE — Telephone Encounter (Signed)
Please schedule pt for ov for med refill.

## 2021-06-19 NOTE — Telephone Encounter (Signed)
I would be okay giving a temporary refill but we definitely need to get her scheduled for follow-up first then I can send in a refill

## 2021-06-19 NOTE — Progress Notes (Signed)
I acted as a Education administrator for [employer], PA-C Anselmo Pickler, LPN  Virtual Visit via Video Note   I, Inda Coke connected with  Karla Howell  ([id number], Oct 18, 1965) on 06/19/21 at  2:00 PM EST by a video-enabled telemedicine application and verified that I am speaking with the correct person using two identifiers.  Location: Patient: Home Provider: [place] office   I discussed the limitations of evaluation and management by telemedicine and the availability of in person appointments. The patient expressed understanding and agreed to proceed.    History of Present Illness: Karla Howell is a 56 y.o. who identifies as a female who was assigned female at birth, and is being seen today for sinus problem.   Pt c/o nasal congestion, clear drainage, sinus pressure, headaches, eyes, ears itching x 2 weeks. Has been using benadryl, nose sprays, Zyrtec and started using Zyrtec D this week. Denies fever, chills, SOB. Has been using OTC medications no relief.  Has not taken a COVID test.   Problems:  Patient Active Problem List   Diagnosis Date Noted   GERD (gastroesophageal reflux disease) 10/04/2018   Insomnia 10/04/2018   Rheumatoid arthritis ([place]) 10/04/2018   H/O total hysterectomy 03/27/2018   Acute non-recurrent maxillary sinusitis 09/27/2016   Essential hypertension 07/19/2014   Generalized anxiety disorder 03/15/2014   Allergic rhinitis 03/26/2013   Anemia 01/21/2012   IBS 10/25/2009    Allergies: No Known Allergies Medications:  Current Outpatient Medications:    amoxicillin-clavulanate (AUGMENTIN) 875-125 MG tablet, Take 1 tablet by mouth 2 (two) times daily., Disp: 14 tablet, Rfl: 0   azelastine (ASTELIN) 0.1 % nasal spray, Place into both nostrils as needed., Disp: , Rfl:    azelastine (OPTIVAR) 0.05 % ophthalmic solution, Place 1 drop into both eyes as needed., Disp: , Rfl:    celecoxib (CELEBREX) 200 MG capsule, Take 200 mg by  mouth daily., Disp: , Rfl:    citalopram (CELEXA) 10 MG tablet, TAKE 1 TABLET BY MOUTH EVERY DAY, Disp: 90 tablet, Rfl: 0   HUMIRA PEN 40 MG/0.4ML PNKT, Inject 1 mL into the muscle every 14 (fourteen) days. , Disp: , Rfl:    montelukast (SINGULAIR) 10 MG tablet, Take 10 mg by mouth daily., Disp: , Rfl:    Multiple Vitamin (MULTIVITAMIN) tablet, Take 1 tablet by mouth daily., Disp: , Rfl:    vitamin C (ASCORBIC ACID) 500 MG tablet, Take 500 mg by mouth daily. Reported on 10/02/2015, Disp: , Rfl:    ZINC GLUCONATE PO, Take by mouth., Disp: , Rfl:    zolpidem (AMBIEN) 5 MG tablet, TAKE 1 TABLET BY MOUTH AT BEDTIME AS NEEDED FOR SLEEP, Disp: 30 tablet, Rfl: 1  Observations/Objective: Patient is well-developed, well-nourished in no acute distress.  Resting comfortably  at home.  Head is normocephalic, atraumatic.  No labored breathing.  Speech is clear and coherent with logical content.  Patient is alert and oriented at baseline.    Assessment and Plan: 1. Acute non-recurrent maxillary sinusitis No red flags on discussion. Will initiate oral augmentin per orders for sinusitis. Discussed taking medications as prescribed. Reviewed return precautions including worsening fever, SOB, worsening cough or other concerns. Push fluids and rest. I recommend that patient follow-up if symptoms worsen or persist despite treatment x 7-10 days, sooner if needed.   Follow Up Instructions: I discussed the assessment and treatment plan with the patient. The patient was provided an opportunity to ask questions and all were answered. The patient  agreed with the plan and demonstrated an understanding of the instructions.  A copy of instructions were sent to the patient via MyChart unless otherwise noted below.   The patient was advised to call back or seek an in-person evaluation if the symptoms worsen or if the condition fails to improve as anticipated.  Inda Coke, [place]

## 2021-06-19 NOTE — Telephone Encounter (Signed)
Left message on voicemail to call office. Calling to do virtual early.

## 2021-06-25 MED ORDER — ZOLPIDEM TARTRATE 5 MG PO TABS: 5.0000 mg | ORAL_TABLET | Freq: Every evening | ORAL | 0 refills | Status: DC | PRN

## 2021-06-25 NOTE — Addendum Note (Signed)
Addended by: Marin Olp on: 06/25/2021 10:41 AM   Modules accepted: Orders

## 2021-06-28 NOTE — Progress Notes (Signed)
Phone [phone] In person visit   Subjective:   Karla Howell is a 56 y.o. year old very pleasant female patient who presents for/with See problem oriented charting Chief Complaint  Patient presents with   Insomnia    Follow up on Ambien, effective   Numbness    And tingling in left forearm and lower left leg at the same time on and off started in January    This visit occurred during the SARS-CoV-2 public health emergency.  Safety protocols were in place, including screening questions prior to the visit, additional usage of staff PPE, and extensive cleaning of exam room while observing appropriate contact time as indicated for disinfecting solutions.   Past Medical History-  Patient Active Problem List   Diagnosis Date Noted   Rheumatoid arthritis ([place]) 10/04/2018    Priority: High   Essential hypertension 07/19/2014    Priority: Medium    Generalized anxiety disorder 03/15/2014    Priority: Medium    IBS 10/25/2009    Priority: Medium    Immunocompromised ([place]) 07/05/2021    Priority: Low   GERD (gastroesophageal reflux disease) 10/04/2018    Priority: Low   Insomnia 10/04/2018    Priority: Low   H/O total hysterectomy 03/27/2018    Priority: Low   Allergic rhinitis 03/26/2013    Priority: Low   Anemia 01/21/2012    Priority: Low   Acute non-recurrent maxillary sinusitis 09/27/2016    Medications- reviewed and updated Current Outpatient Medications  Medication Sig Dispense Refill   azelastine (ASTELIN) 0.1 % nasal spray Place into both nostrils as needed.     azelastine (OPTIVAR) 0.05 % ophthalmic solution Place 1 drop into both eyes as needed.     celecoxib (CELEBREX) 200 MG capsule Take 200 mg by mouth daily.     Multiple Vitamin (MULTIVITAMIN) tablet Take 1 tablet by mouth daily.     vitamin C (ASCORBIC ACID) 500 MG tablet Take 500 mg by mouth daily. Reported on 10/02/2015     ZINC GLUCONATE PO Take by mouth.     citalopram (CELEXA) 10 MG tablet  Take 1 tablet (10 mg total) by mouth daily. 90 tablet 3   HUMIRA PEN 40 MG/0.4ML PNKT Inject 1 mL into the muscle every 14 (fourteen) days.      montelukast (SINGULAIR) 10 MG tablet Take 10 mg by mouth daily. (Patient not taking: Reported on 07/05/2021)     zolpidem (AMBIEN) 5 MG tablet Take 1 tablet (5 mg total) by mouth at bedtime as needed. for sleep 30 tablet 5   No current facility-administered medications for this visit.     Objective:  BP (!) 148/94 (BP Location: Right Arm, Patient Position: Sitting) Comment: retake in office   Pulse 88    Temp (!) 97.2 F (36.2 C) (Temporal)    Ht 5' 3.5" (1.613 m)    Wt 171 lb 6.4 oz (77.7 kg)    LMP 01/06/2013    SpO2 98%    BMI 29.89 kg/m  Gen: NAD, resting comfortably CV: RRR no murmurs rubs or gallops Lungs: CTAB no crackles, wheeze, rhonchi Ext: no edema Skin: warm, dry Neuro: 5 out of 5 muscle strength upper and lower extremities with intact gross touch.  Negative Spurling's.  Negative carpal tunnel testing.    Assessment and Plan   #Left lower leg numbness/weakness in addition to left arm S: Patient reports she developed both left lower leg and left arm numbness/muscle weakness sensation starting earlier this month when  she got out of bed. Distribution bottom of the calf down and at same time from forearm down the hand. At longest lasts for a minute but can be a few seconds. Always happens together - usually just with standing. 5-6 x in last month  A/P: unclear etiology- interesting distribution both left hand and foot each time- thankfully intermittent and short lived- possible nerve root irritation in neck? But interesting spares other areas. Considered neurology referral- she wants to start with bloodwork first    # Anxiety/GAD-our last visit was 04/26/2020 by video S:Medication: citalopram 10mg - takes every few days . No SI.  - working again [employer] but not travel this time! Better work life balance!  - she would prefer to wean.   Counseling: seen Lattie Haw in the past but not since early 2020. -No Si  A/P: With less stressful job she is doing much better in regards to anxiety.  She would like to wean off Celexa.  Recommended half tablet daily of Celexa for the next 2 weeks and then half tablet every other day for a week and then stop.  -if symptoms worsens after fully being off can restart at 5 or 10 mg and let me know.   #Insomnia S: Patient continued only get 2 or 3 hours of sleep at night without Ambien-with Ambien she was able to rest much more comfortably.  She uses Ambien 5 mg daily. Some issues waking up after about 5 hours recently.  A/P: mild poor control. Continue current meds but discussed some sleep hygiene changes- no screens at minimum 30 minutes before bed  #Rheumatoid arthritis/immunocompromised-follow-up with Dr. Max Sane rheumatology S: Medication: On Humira through rheumatology.  Reports reasonable control.  She is also on Celebrex through their office sparing A/P: Reasonable control on Humira-continue current medication.  She is immunocompromise due to Humira-status noted   - flu shot today  #elevated BP reading S: medication: none Home readings #s: was 165 at ob/gyn. Saw rheum earlier this week 120/90 -not exercise BP Readings from Last 3 Encounters:  07/05/21 (!) 148/94  01/26/19 (!) 139/96  05/28/18 128/90  A/P: Trending back patient actually has several elevated diastolic readings-she agrees to do some home monitoring/buyblood pressure cuff and update me in 2 weeks.  Will give information on [place] eating plan and recommended exercising 150 minutes a week- can start with 5-10 mins a day   #Recent sinus infection-was treated by Inda Coke on 06/19/2021 and prior to that on 09/11/2020 . Reports mild lingering issues but much improved  # Calf Muscle Concerns - from 2021- she reports this cleared up without intervention  #Vitamin D deficiency S: Medication: takes otc vitamin D she thinks  1000 units Last vitamin D Lab Results  Component Value Date   VD25OH 28.1 03/17/2017  A/P:  hopefully stable- update vitamin D today. Continue current meds for now  #Hypertriglyceridemia S: Medication:none  Lab Results  Component Value Date   CHOL 159 03/17/2017   HDL 48 03/17/2017   LDLCALC 62 03/17/2017   TRIG 243 (A) 03/17/2017   CHOLHDL 3 04/04/2016   A/P: Patient with high triglycerides in the past-update lipid panel as has been 5 years since we have on file and calculate ASCVD risk  #Allergy medication-she request that we take over Singulair and Astelin-these have been prescribed by her allergist to this point-she will check with them for short-term refill but when we get reestablished with our regular office visits with patient I discussed we could likely take  this over  Recommended follow up: Return in about 6 months (around 01/02/2022) for physical or sooner if needed. Future Appointments  Date Time Provider [place]  12/19/2021  2:40 PM Marin Olp, MD [facility]   Lab/Order associations: coffee with creamer only   ICD-10-CM   1. Insomnia, unspecified type  G47.00     2. Generalized anxiety disorder  F41.1     3. Immunocompromised ([place])  D84.9     4. Rheumatoid arthritis with negative rheumatoid factor, involving unspecified site ([place]) Chronic M06.00     5. Vitamin D deficiency  E55.9 VITAMIN D 25 Hydroxy (Vit-D Deficiency, Fractures)    6. Hypertriglyceridemia  E78.1 CBC with Differential/Platelet    Comprehensive metabolic panel    Lipid panel    7. Paresthesias  R20.2 TSH    Vitamin B12      Meds ordered this encounter  Medications   zolpidem (AMBIEN) 5 MG tablet    Sig: Take 1 tablet (5 mg total) by mouth at bedtime as needed. for sleep    Dispense:  30 tablet    Refill:  5    Not to exceed 3 additional fills before 07/07/2021   citalopram (CELEXA) 10 MG tablet    Sig: Take 1 tablet (10 mg total) by mouth daily.    Dispense:  90  tablet    Refill:  3    I,Jada Bradford,acting as a scribe for Garret Reddish, MD.,have documented all relevant documentation on the behalf of Garret Reddish, MD,as directed by  Garret Reddish, MD while in the presence of Garret Reddish, MD.  I, Garret Reddish, MD, have reviewed all documentation for this visit. The documentation on 07/05/21 for the exam, diagnosis, procedures, and orders are all accurate and complete.  Return precautions advised.  Garret Reddish, MD

## 2021-07-01 ENCOUNTER — Encounter: Payer: Self-pay | Admitting: Family Medicine

## 2021-07-02 DIAGNOSIS — Z79899 Other long term (current) drug therapy: Secondary | ICD-10-CM | POA: Diagnosis not present

## 2021-07-02 DIAGNOSIS — M150 Primary generalized (osteo)arthritis: Secondary | ICD-10-CM | POA: Diagnosis not present

## 2021-07-02 DIAGNOSIS — M0600 Rheumatoid arthritis without rheumatoid factor, unspecified site: Secondary | ICD-10-CM | POA: Diagnosis not present

## 2021-07-05 ENCOUNTER — Other Ambulatory Visit: Payer: Self-pay

## 2021-07-05 ENCOUNTER — Ambulatory Visit (INDEPENDENT_AMBULATORY_CARE_PROVIDER_SITE_OTHER): Payer: BC Managed Care – PPO | Admitting: Family Medicine

## 2021-07-05 ENCOUNTER — Encounter: Payer: Self-pay | Admitting: Family Medicine

## 2021-07-05 VITALS — BP 148/94 | HR 88 | Temp 97.2°F | Ht 63.5 in | Wt 171.4 lb

## 2021-07-05 DIAGNOSIS — F411 Generalized anxiety disorder: Secondary | ICD-10-CM | POA: Diagnosis not present

## 2021-07-05 DIAGNOSIS — R202 Paresthesia of skin: Secondary | ICD-10-CM

## 2021-07-05 DIAGNOSIS — G4700 Insomnia, unspecified: Secondary | ICD-10-CM

## 2021-07-05 DIAGNOSIS — M0600 Rheumatoid arthritis without rheumatoid factor, unspecified site: Secondary | ICD-10-CM

## 2021-07-05 DIAGNOSIS — D849 Immunodeficiency, unspecified: Secondary | ICD-10-CM

## 2021-07-05 DIAGNOSIS — E781 Pure hyperglyceridemia: Secondary | ICD-10-CM

## 2021-07-05 DIAGNOSIS — Z23 Encounter for immunization: Secondary | ICD-10-CM

## 2021-07-05 DIAGNOSIS — E559 Vitamin D deficiency, unspecified: Secondary | ICD-10-CM

## 2021-07-05 MED ORDER — ZOLPIDEM TARTRATE 5 MG PO TABS: 5.0000 mg | ORAL_TABLET | Freq: Every evening | ORAL | 5 refills | Status: DC | PRN

## 2021-07-05 MED ORDER — CITALOPRAM HYDROBROMIDE 10 MG PO TABS: 10.0000 mg | ORAL_TABLET | Freq: Every day | ORAL | 3 refills | Status: DC

## 2021-07-05 NOTE — Patient Instructions (Addendum)
Health Maintenance Due  Topic Date Due   COVID-19 Vaccine (3 - Moderna risk series)- Team please get the date of last covid shot   05/20/2020   INFLUENZA VACCINE - Flu shot today in office!  01/07/2021   Please try half-tablet of Celexa daily for the next 2 weeks and half-tablet every other day for a week then stop. If symptoms worsens after being fully off- you can restart at 5 or 10 mg, just let me know.  Please get a blood pressure cuff (Omron Series 3) at [employer] and do some home blood pressure readings for the next two weeks and update [place].   Also please try exercising for 150 minutes a week (30 minutes a day) and read the [place] eating-plan handout to help with your blood pressure   Please stop by lab before you go If you have mychart- we will send your results within 3 business days of [place] receiving them.  If you do not have mychart- we will call you about results within 5 business days of [place] receiving them.  *please also note that you will see labs on mychart as soon as they post. I will later go in and write notes on them- will say "notes from Dr. Yong Channel"  Recommended follow up: Return in about 6 months (around 01/02/2022) for physical or sooner if needed.

## 2021-07-05 NOTE — Addendum Note (Signed)
Addended by: Sheilah Pigeon A on: 07/05/2021 01:15 PM   Modules accepted: Orders

## 2021-07-05 NOTE — Addendum Note (Signed)
Addended by: Sheilah Pigeon A on: 07/05/2021 11:23 AM   Modules accepted: Orders

## 2021-07-12 ENCOUNTER — Other Ambulatory Visit: Payer: Self-pay

## 2021-07-12 ENCOUNTER — Other Ambulatory Visit (INDEPENDENT_AMBULATORY_CARE_PROVIDER_SITE_OTHER): Payer: BC Managed Care – PPO

## 2021-07-12 DIAGNOSIS — E781 Pure hyperglyceridemia: Secondary | ICD-10-CM

## 2021-07-12 DIAGNOSIS — R202 Paresthesia of skin: Secondary | ICD-10-CM

## 2021-07-12 DIAGNOSIS — E559 Vitamin D deficiency, unspecified: Secondary | ICD-10-CM

## 2021-07-12 LAB — CBC WITH DIFFERENTIAL/PLATELET
Basophils Absolute: 0 10*3/uL (ref 0.0–0.1)
Basophils Relative: 0.9 % (ref 0.0–3.0)
Eosinophils Absolute: 0.2 10*3/uL (ref 0.0–0.7)
Eosinophils Relative: 4.8 % (ref 0.0–5.0)
HCT: 37.7 % (ref 36.0–46.0)
Hemoglobin: 12.2 g/dL (ref 12.0–15.0)
Lymphocytes Relative: 40.8 % (ref 12.0–46.0)
Lymphs Abs: 2 10*3/uL (ref 0.7–4.0)
MCHC: 32.4 g/dL (ref 30.0–36.0)
MCV: 88.7 fl (ref 78.0–100.0)
Monocytes Absolute: 0.5 10*3/uL (ref 0.1–1.0)
Monocytes Relative: 10.2 % (ref 3.0–12.0)
Neutro Abs: 2.1 10*3/uL (ref 1.4–7.7)
Neutrophils Relative %: 43.3 % (ref 43.0–77.0)
Platelets: 305 10*3/uL (ref 150.0–400.0)
RBC: 4.25 Mil/uL (ref 3.87–5.11)
RDW: 12.2 % (ref 11.5–15.5)
WBC: 5 10*3/uL (ref 4.0–10.5)

## 2021-07-12 LAB — COMPREHENSIVE METABOLIC PANEL
ALT: 12 U/L (ref 0–35)
AST: 17 U/L (ref 0–37)
Albumin: 3.9 g/dL (ref 3.5–5.2)
Alkaline Phosphatase: 73 U/L (ref 39–117)
BUN: 14 mg/dL (ref 6–23)
CO2: 32 mEq/L (ref 19–32)
Calcium: 9.3 mg/dL (ref 8.4–10.5)
Chloride: 102 mEq/L (ref 96–112)
Creatinine, Ser: 1.03 mg/dL (ref 0.40–1.20)
GFR: 60.73 mL/min (ref >60.00)
Glucose, Bld: 103 mg/dL — ABNORMAL HIGH (ref 70–99)
Potassium: 3.7 mEq/L (ref 3.5–5.1)
Sodium: 138 mEq/L (ref 135–145)
Total Bilirubin: 0.5 mg/dL (ref 0.2–1.2)
Total Protein: 7.3 g/dL (ref 6.0–8.3)

## 2021-07-12 LAB — LIPID PANEL
Cholesterol: 165 mg/dL (ref 0–200)
HDL: 37 mg/dL — ABNORMAL LOW (ref >39.00)
LDL Cholesterol: 90 mg/dL (ref 0–99)
NonHDL: 127.97
Total CHOL/HDL Ratio: 4
Triglycerides: 189 mg/dL — ABNORMAL HIGH (ref 0.0–149.0)
VLDL: 37.8 mg/dL (ref 0.0–40.0)

## 2021-07-12 LAB — TSH: TSH: 2.58 u[IU]/mL (ref 0.35–5.50)

## 2021-07-12 LAB — VITAMIN D 25 HYDROXY (VIT D DEFICIENCY, FRACTURES): VITD: 28.76 ng/mL — ABNORMAL LOW (ref 30.00–100.00)

## 2021-07-12 LAB — VITAMIN B12: Vitamin B-12: 637 pg/mL (ref 211–911)

## 2021-07-14 ENCOUNTER — Other Ambulatory Visit: Payer: Self-pay

## 2021-07-14 ENCOUNTER — Ambulatory Visit
Admission: EM | Admit: 2021-07-14 | Discharge: 2021-07-14 | Disposition: A | Discharge status: Home / Self Care | Payer: BC Managed Care – PPO | Attending: Physician Assistant | Admitting: Physician Assistant

## 2021-07-14 ENCOUNTER — Encounter: Payer: Self-pay | Admitting: Emergency Medicine

## 2021-07-14 DIAGNOSIS — J0101 Acute recurrent maxillary sinusitis: Secondary | ICD-10-CM

## 2021-07-14 MED ORDER — DOXYCYCLINE HYCLATE 100 MG PO CAPS: 100.0000 mg | ORAL_CAPSULE | Freq: Two times a day (BID) | ORAL | 0 refills | Status: DC

## 2021-07-14 NOTE — Discharge Instructions (Signed)
°  Please follow up if no improvement or if symptoms worsen in any way.

## 2021-07-14 NOTE — ED Provider Notes (Signed)
[facility]    CSN: [id number] Arrival date & time: 07/14/21  0836      History   Chief Complaint Chief Complaint  Patient presents with   Nasal Congestion    HPI Karla Howell is a 56 y.o. female.   Patient here today for evaluation of congestion and sinus pressure that has been present for the last several weeks. She was recently treated for sinusitis but did not feel improvement with treatment. She has not had fever. She denies any other symptoms.   The history is provided by the patient.   Past Medical History:  Diagnosis Date   Allergy    Anemia    history of    GERD (gastroesophageal reflux disease)    Heartburn    Hemorrhoids    IBS (irritable bowel syndrome)     Patient Active Problem List   Diagnosis Date Noted   Immunocompromised ([place]) 07/05/2021   GERD (gastroesophageal reflux disease) 10/04/2018   Insomnia 10/04/2018   Rheumatoid arthritis ([place]) 10/04/2018   H/O total hysterectomy 03/27/2018   Acute non-recurrent maxillary sinusitis 09/27/2016   Essential hypertension 07/19/2014   Generalized anxiety disorder 03/15/2014   Allergic rhinitis 03/26/2013   Anemia 01/21/2012   IBS 10/25/2009    Past Surgical History:  Procedure Laterality Date   BILATERAL SALPINGECTOMY Bilateral 01/25/2013   Procedure: BILATERAL SALPINGECTOMY;  Surgeon: Olga Millers, MD;  Location: [facility];  Service: Gynecology;  Laterality: Bilateral;   LAPAROSCOPIC ASSISTED VAGINAL HYSTERECTOMY N/A 01/25/2013   Procedure: LAPAROSCOPIC ASSISTED VAGINAL HYSTERECTOMY;  Surgeon: Olga Millers, MD;  Location: [facility];  Service: Gynecology;  Laterality: N/A;   thumb surgery Left     OB History   No obstetric history on file.      Home Medications    Prior to Admission medications   Medication Sig Start Date End Date Taking? Authorizing Provider  doxycycline (VIBRAMYCIN) 100 MG capsule Take 1 capsule (100 mg total) by mouth 2 (two) times daily. 07/14/21  Yes  Francene Finders, PA-C  azelastine (ASTELIN) 0.1 % nasal spray Place into both nostrils as needed.    [provider]  azelastine (OPTIVAR) 0.05 % ophthalmic solution Place 1 drop into both eyes as needed.    [provider]  celecoxib (CELEBREX) 200 MG capsule Take 200 mg by mouth daily. 07/24/20   [provider]  citalopram (CELEXA) 10 MG tablet Take 1 tablet (10 mg total) by mouth daily. 07/05/21   Marin Olp, MD  HUMIRA PEN 40 MG/0.4ML PNKT Inject 1 mL into the muscle every 14 (fourteen) days.  02/19/18   [provider]  montelukast (SINGULAIR) 10 MG tablet Take 10 mg by mouth daily. Patient not taking: Reported on 07/05/2021 03/29/20   [provider]  Multiple Vitamin (MULTIVITAMIN) tablet Take 1 tablet by mouth daily.    [provider]  vitamin C (ASCORBIC ACID) 500 MG tablet Take 500 mg by mouth daily. Reported on 10/02/2015    [provider]  ZINC GLUCONATE PO Take by mouth.    [provider]  zolpidem (AMBIEN) 5 MG tablet Take 1 tablet (5 mg total) by mouth at bedtime as needed. for sleep 07/05/21   Marin Olp, MD    Family History Family History  Problem Relation Age of Onset   Hypertension Mother    Hyperlipidemia Mother    Colon polyps Mother    Hypertension Sister    Heart disease Maternal Grandmother  Diabetes Maternal Grandmother    Heart disease Maternal Grandfather    Colon cancer Neg Hx    Esophageal cancer Neg Hx    Rectal cancer Neg Hx    Stomach cancer Neg Hx     Social History Social History   Tobacco Use   Smoking status: Never   Smokeless tobacco: Never  Substance Use Topics   Alcohol use: Yes    Alcohol/week: 7.0 standard drinks    Types: 7 Standard drinks or equivalent per week    Comment: socially   Drug use: No     Allergies   Patient has no known allergies.   Review of Systems Review of Systems  Constitutional:  Negative for chills and fever.  HENT:   Positive for congestion and sinus pressure. Negative for ear pain and sore throat.   Eyes:  Negative for discharge and redness.  Respiratory:  Negative for cough, shortness of breath and wheezing.   Gastrointestinal:  Negative for abdominal pain, diarrhea, nausea and vomiting.    Physical Exam Triage Vital Signs ED Triage Vitals  Enc Vitals Group     BP 07/14/21 1001 (!) 140/93     Pulse Rate 07/14/21 1001 89     Resp 07/14/21 1001 18     Temp 07/14/21 1001 98.2 F (36.8 C)     Temp Source 07/14/21 1001 Oral     SpO2 07/14/21 1001 98 %     Weight --      Height --      Head Circumference --      Peak Flow --      Pain Score 07/14/21 1002 0     Pain Loc --      Pain Edu? --      Excl. in [place]? --    No data found.  Updated Vital Signs BP (!) 140/93 (BP Location: Left Arm)    Pulse 89    Temp 98.2 F (36.8 C) (Oral)    Resp 18    LMP 01/06/2013    SpO2 98%      Physical Exam Vitals and nursing note reviewed.  Constitutional:      General: She is not in acute distress.    Appearance: Normal appearance. She is not ill-appearing.  HENT:     Head: Normocephalic and atraumatic.     Nose: Congestion present.  Eyes:     Conjunctiva/sclera: Conjunctivae normal.  Cardiovascular:     Rate and Rhythm: Normal rate.  Pulmonary:     Effort: Pulmonary effort is normal. No respiratory distress.  Skin:    General: Skin is warm and dry.  Neurological:     Mental Status: She is alert.  Psychiatric:        Mood and Affect: Mood normal.        Thought Content: Thought content normal.     UC Treatments / Results  Labs (all labs ordered are listed, but only abnormal results are displayed) Labs Reviewed - No data to display  EKG   Radiology No results found.  Procedures Procedures (including critical care time)  Medications Ordered in UC Medications - No data to display  Initial Impression / Assessment and Plan / UC Course  I have reviewed the triage vital signs and the  nursing notes.  Pertinent labs & imaging results that were available during my care of the patient were reviewed by me and considered in my medical decision making (see chart for details).   Will trial different  antibiotic for sinusitis and recommended follow up if no improvement or if symptoms worsens.  Final Clinical Impressions(s) / UC Diagnoses   Final diagnoses:  Acute recurrent maxillary sinusitis     Discharge Instructions       Please follow up if no improvement or if symptoms worsen in any way.      ED Prescriptions     Medication Sig Dispense Auth. Provider   doxycycline (VIBRAMYCIN) 100 MG capsule Take 1 capsule (100 mg total) by mouth 2 (two) times daily. 20 capsule Francene Finders, PA-C      PDMP not reviewed this encounter.   Francene Finders, PA-C 07/14/21 1108

## 2021-07-14 NOTE — ED Triage Notes (Signed)
Pt here for nasal congestion and sinus pressure x 2 weeks; denies pain or fever; pt sts recently treated for sinus infection

## 2021-07-18 ENCOUNTER — Encounter: Payer: Self-pay | Admitting: Family Medicine

## 2021-07-19 ENCOUNTER — Telehealth: Payer: Self-pay | Admitting: Family Medicine

## 2021-07-19 ENCOUNTER — Other Ambulatory Visit: Payer: Self-pay

## 2021-07-19 MED ORDER — AMLODIPINE BESYLATE 2.5 MG PO TABS: 2.5000 mg | ORAL_TABLET | Freq: Every day | ORAL | 3 refills | Status: DC

## 2021-07-19 NOTE — Telephone Encounter (Signed)
Pt states per her Mychart msg with Dr Yong Channel, a new medication should be ordered for her.   MEDICATION:  amlodipine 2.5 mg daily #90   PHARMACY: [id number] - [place], [address] Phone:  [phone]  Fax:  [phone]

## 2021-07-19 NOTE — Telephone Encounter (Signed)
This has been sent to pt pharmacy.

## 2021-09-12 ENCOUNTER — Ambulatory Visit (INDEPENDENT_AMBULATORY_CARE_PROVIDER_SITE_OTHER): Payer: BC Managed Care – PPO | Admitting: Physician Assistant

## 2021-09-12 ENCOUNTER — Encounter: Payer: Self-pay | Admitting: Physician Assistant

## 2021-09-12 VITALS — BP 144/90 | HR 80 | Temp 97.2°F | Ht 63.5 in | Wt 171.6 lb

## 2021-09-12 DIAGNOSIS — T485X5A Adverse effect of other anti-common-cold drugs, initial encounter: Secondary | ICD-10-CM

## 2021-09-12 DIAGNOSIS — J310 Chronic rhinitis: Secondary | ICD-10-CM | POA: Diagnosis not present

## 2021-09-12 MED ORDER — PREDNISONE 20 MG PO TABS: 40.0000 mg | ORAL_TABLET | Freq: Every day | ORAL | 0 refills | Status: DC

## 2021-09-12 NOTE — Patient Instructions (Signed)
It was great to see you! ? ?I think you are experiencing -- rhinitis medicamentosa ? ?Many days of regular use of over-the-counter decongestant nasal sprays leads to rebound nasal congestion as the medication wears off, prompting patients to administer the medicine more frequently to obtain relief. This begins a vicious cycle of nasal congestion, both caused and temporarily relieved by the medication, with escalating use and eventual dependency. The risk of developing rhinitis medicamentosa from over-the-counter nasal decongestants can be minimized by limiting use to a maximum of approximately five days and not exceeding recommended frequencies and using as few doses as possible during those days. There is also evidence that this is less likely to occur if the patient is administering an intranasal glucocorticoid along with the nasal decongestant. Treatment begins with withdrawal of the medication.  ? ?Nasal saline spray (i.e., Simply Saline) or nasal saline lavage (i.e., NeilMed) is recommended as needed and prior to medicated nasal sprays. ? ?Consider -- Flonase 1 spray twice a day. ? ?Oral prednisone sent in. ? ?Take care, ? ?Inda Coke PA-C  ?

## 2021-09-12 NOTE — Progress Notes (Signed)
Karla Howell is a 56 y.o. female here for sinus congestion. ? ?History of Present Illness:  ? ?Chief Complaint  ?Patient presents with  ? Sinus Problem  ?  Pt has been dealing with Sinus problems for the past 4/5 months. Stuffy, drainage, and runny nose. Pt does have allergies and itchy nose,  ? ? ?Chronic Sinus Congestion ?Karla Howell presents with c/o sinus congestion that has been onset for 4-5 months. States that while she has mainly been experiencing sinus congestion she has also been experiencing rhinorrhea and PND. Pt reports she does have seasonal allergies, which has seemingly been worsening over the past 2-3 years. Although she has been dx with non-allergic rhinitis and trialed multiple medications such as flonase nasal spray, zyrtec D, astepro nasal spray, and afrin nasal spray, she is still experiencing sx. In an additional effort to manage her sx, she has been trying to use a nettipot regularly to relieve her sx but this hasn't helped as well. At this time she is looking for prolonged relief so she is able to breath effectively. Denies fever, chills, cough, or epistaxis.  ? ? ?Past Medical History:  ?Diagnosis Date  ? Allergy   ? Anemia   ? history of   ? GERD (gastroesophageal reflux disease)   ? Heartburn   ? Hemorrhoids   ? IBS (irritable bowel syndrome)   ? ?  ?Social History  ? ?Tobacco Use  ? Smoking status: Never  ? Smokeless tobacco: Never  ?Substance Use Topics  ? Alcohol use: Yes  ?  Alcohol/week: 7.0 standard drinks  ?  Types: 7 Standard drinks or equivalent per week  ?  Comment: socially  ? Drug use: No  ? ? ?Past Surgical History:  ?Procedure Laterality Date  ? BILATERAL SALPINGECTOMY Bilateral 01/25/2013  ? Procedure: BILATERAL SALPINGECTOMY;  Surgeon: Olga Millers, MD;  Location: [facility];  Service: Gynecology;  Laterality: Bilateral;  ? LAPAROSCOPIC ASSISTED VAGINAL HYSTERECTOMY N/A 01/25/2013  ? Procedure: LAPAROSCOPIC ASSISTED VAGINAL HYSTERECTOMY;  Surgeon: Olga Millers, MD;   Location: [facility];  Service: Gynecology;  Laterality: N/A;  ? thumb surgery Left   ? ? ?Family History  ?Problem Relation Age of Onset  ? Hypertension Mother   ? Hyperlipidemia Mother   ? Colon polyps Mother   ? Hypertension Sister   ? Heart disease Maternal Grandmother   ? Diabetes Maternal Grandmother   ? Heart disease Maternal Grandfather   ? Colon cancer Neg Hx   ? Esophageal cancer Neg Hx   ? Rectal cancer Neg Hx   ? Stomach cancer Neg Hx   ? ? ?No Known Allergies ? ?Current Medications:  ? ?Current Outpatient Medications:  ?  amLODipine (NORVASC) 2.5 MG tablet, Take 1 tablet (2.5 mg total) by mouth daily., Disp: 90 tablet, Rfl: 3 ?  azelastine (ASTELIN) 0.1 % nasal spray, Place into both nostrils as needed., Disp: , Rfl:  ?  azelastine (OPTIVAR) 0.05 % ophthalmic solution, Place 1 drop into both eyes as needed., Disp: , Rfl:  ?  celecoxib (CELEBREX) 200 MG capsule, Take 200 mg by mouth daily., Disp: , Rfl:  ?  citalopram (CELEXA) 10 MG tablet, Take 1 tablet (10 mg total) by mouth daily., Disp: 90 tablet, Rfl: 3 ?  HUMIRA PEN 40 MG/0.4ML PNKT, Inject 1 mL into the muscle every 14 (fourteen) days. , Disp: , Rfl:  ?  montelukast (SINGULAIR) 10 MG tablet, Take 10 mg by mouth daily., Disp: , Rfl:  ?  vitamin  C (ASCORBIC ACID) 500 MG tablet, Take 500 mg by mouth daily. Reported on 10/02/2015, Disp: , Rfl:  ?  ZINC GLUCONATE PO, Take by mouth., Disp: , Rfl:  ?  zolpidem (AMBIEN) 5 MG tablet, Take 1 tablet (5 mg total) by mouth at bedtime as needed. for sleep, Disp: 30 tablet, Rfl: 5  ? ?Review of Systems:  ? ?ROS ?Negative unless otherwise specified per HPI. ?Vitals:  ? ?Vitals:  ? 09/12/21 0823  ?BP: (!) 144/90  ?Pulse: 80  ?Temp: (!) 97.2 ?F (36.2 ?C)  ?SpO2: 99%  ?Weight: 171 lb 9.6 oz (77.8 kg)  ?Height: 5' 3.5" (1.613 m)  ?   ?Body mass index is 29.92 kg/m?. ? ?Physical Exam:  ? ?Physical Exam ?Vitals and nursing note reviewed.  ?Constitutional:   ?   General: She is not in acute distress. ?   Appearance: She is  well-developed. She is not ill-appearing or toxic-appearing.  ?HENT:  ?   Head: Normocephalic and atraumatic.  ?   Right Ear: Hearing, tympanic membrane, ear canal and external ear normal.  ?   Left Ear: Hearing, tympanic membrane, ear canal and external ear normal.  ?   Nose: Congestion present.  ?   Right Turbinates: Swollen.  ?   Left Turbinates: Swollen.  ?   Mouth/Throat:  ?   Lips: Pink.  ?   Mouth: Mucous membranes are moist.  ?   Pharynx: Oropharynx is clear.  ?   Tonsils: No tonsillar exudate.  ?Cardiovascular:  ?   Rate and Rhythm: Normal rate and regular rhythm.  ?   Pulses: Normal pulses.  ?   Heart sounds: Normal heart sounds, S1 normal and S2 normal.  ?Pulmonary:  ?   Effort: Pulmonary effort is normal.  ?   Breath sounds: Normal breath sounds.  ?Skin: ?   General: Skin is warm and dry.  ?Neurological:  ?   Mental Status: She is alert.  ?   GCS: GCS eye subscore is 4. GCS verbal subscore is 5. GCS motor subscore is 6.  ?Psychiatric:     ?   Speech: Speech normal.     ?   Behavior: Behavior normal. Behavior is cooperative.  ? ? ?Assessment and Plan:  ? ?Rhinitis medicamentosa ?No red flags ?Stop use of Afrin asap ?Oral prednisone sent in -- 20 mg BID x 5 days ?Start Flonase nasal spray, 1 spray twice daily and prednisone 40 mg daily ?Trial nasal saline spray or nasal saline lavage prior to use of medicated sprays ?Follow up if new/worsening symptoms or concerns occur  ? ? ?I,Havlyn C Ratchford,acting as a scribe for [employer], PA.,have documented all relevant documentation on the behalf of Inda Coke, PA,as directed by  Inda Coke, PA while in the presence of Inda Coke, [place]. ? ?IInda Coke, PA, have reviewed all documentation for this visit. The documentation on 09/12/21 for the exam, diagnosis, procedures, and orders are all accurate and complete. ? ? ?Inda Coke, PA-C ? ?

## 2021-09-16 DIAGNOSIS — M1991 Primary osteoarthritis, unspecified site: Secondary | ICD-10-CM | POA: Diagnosis not present

## 2021-09-16 DIAGNOSIS — M0600 Rheumatoid arthritis without rheumatoid factor, unspecified site: Secondary | ICD-10-CM | POA: Diagnosis not present

## 2021-09-16 DIAGNOSIS — Z79899 Other long term (current) drug therapy: Secondary | ICD-10-CM | POA: Diagnosis not present

## 2021-11-20 ENCOUNTER — Ambulatory Visit (HOSPITAL_COMMUNITY)
Admission: EM | Admit: 2021-11-20 | Discharge: 2021-11-20 | Disposition: A | Discharge status: Home / Self Care | Payer: BC Managed Care – PPO | Attending: Family Medicine | Admitting: Family Medicine

## 2021-11-20 ENCOUNTER — Encounter (HOSPITAL_COMMUNITY): Payer: Self-pay

## 2021-11-20 VITALS — BP 134/90 | HR 85 | Temp 98.4°F | Resp 16 | Ht 64.0 in | Wt 170.0 lb

## 2021-11-20 DIAGNOSIS — L03012 Cellulitis of left finger: Secondary | ICD-10-CM | POA: Diagnosis not present

## 2021-11-20 MED ORDER — SULFAMETHOXAZOLE-TRIMETHOPRIM 800-160 MG PO TABS: 1.0000 | ORAL_TABLET | Freq: Two times a day (BID) | ORAL | 0 refills | Status: AC

## 2021-11-20 NOTE — ED Triage Notes (Signed)
Abbess on the left hand middle finger for 5 days. Patient states it is painful. No drainage, Patient had been doing warm soaks and using Neosporin. No known injury.

## 2021-11-20 NOTE — ED Provider Notes (Signed)
[facility]   [id number] 11/20/21 Arrival Time: 0737  ASSESSMENT & PLAN:  1. Paronychia of finger of left hand     Incision and Drainage Procedure Note  Anesthesia: PainEase spray  Procedure Details  The procedure, risks and complications have been discussed in detail (including, but not limited to pain and bleeding) with the patient.  The skin induration was prepped and draped in the usual fashion. After adequate local anesthesia, I&D with a #11 blade was performed on the left 3rd finger nailfold with minimal, purulent drainage.  EBL: minimal Drains: none Packing: n/a Condition: Tolerated procedure well Complications: none.  Begin: Meds ordered this encounter  Medications   sulfamethoxazole-trimethoprim (BACTRIM DS) 800-160 MG tablet    Sig: Take 1 tablet by mouth 2 (two) times daily for 10 days.    Dispense:  14 tablet    Refill:  0    Wound care instructions discussed and given in written format. To return in 48 hours for wound check if needed or if not improving.  Finish all antibiotics. OTC analgesics as needed.  Reviewed expectations re: course of current medical issues. Questions answered. Outlined signs and symptoms indicating need for more acute intervention. Patient verbalized understanding. After Visit Summary given.   SUBJECTIVE:  Karla Howell is a 56 y.o. female who presents with a possible infection of her LEFT 3rd finger; around nail. Onset gradual, several d ago without active drainage and without active bleeding. Very TTP. Symptoms have gradually worsened since beginning. Fever: absent. OTC/home treatment: none.   OBJECTIVE:  Vitals:   11/20/21 1125 11/20/21 1126  BP: 134/90   Pulse: 85   Resp: 16   Temp: 98.4 F (36.9 C)   TempSrc: Oral   SpO2: 96%   Weight:  77.1 kg  Height:  '5\' 4"'$  (1.626 m)     General appearance: alert; no distress LUE: approx 3 mm induration of her L 3rd finger nailfold with overlying  erythema; tender to touch; no active drainage or bleeding Psychological: alert and cooperative; normal mood and affect  No Known Allergies  Past Medical History:  Diagnosis Date   Allergy    Anemia    history of    GERD (gastroesophageal reflux disease)    Heartburn    Hemorrhoids    IBS (irritable bowel syndrome)    Social History   Socioeconomic History   Marital status: Single    Spouse name: Not on file   Number of children: Not on file   Years of education: Not on file   Highest education level: Not on file  Occupational History   Occupation: insurance  Tobacco Use   Smoking status: Never   Smokeless tobacco: Never  Substance and Sexual Activity   Alcohol use: Yes    Alcohol/week: 7.0 standard drinks of alcohol    Types: 7 Standard drinks or equivalent per week    Comment: socially   Drug use: No   Sexual activity: Yes    Birth control/protection: Condom  Other Topics Concern   Not on file  Social History Narrative   Single. Lives alone. No kids.       Looking for work after job loss with COVID-19 in 2021   In the past-external auditor-medical claims. A lot of travel all over country.    [employer] grad. Degree in mass communications and public relations.       Hobbies: time with friends   Social Determinants of Health   Financial Resource Strain: Not  on file  Food Insecurity: Not on file  Transportation Needs: Not on file  Physical Activity: Not on file  Stress: Not on file  Social Connections: Not on file   Family History  Problem Relation Age of Onset   Hypertension Mother    Hyperlipidemia Mother    Colon polyps Mother    Hypertension Sister    Heart disease Maternal Grandmother    Diabetes Maternal Grandmother    Heart disease Maternal Grandfather    Colon cancer Neg Hx    Esophageal cancer Neg Hx    Rectal cancer Neg Hx    Stomach cancer Neg Hx    Past Surgical History:  Procedure Laterality Date   BILATERAL SALPINGECTOMY Bilateral  01/25/2013   Procedure: BILATERAL SALPINGECTOMY;  Surgeon: Olga Millers, MD;  Location: [facility];  Service: Gynecology;  Laterality: Bilateral;   LAPAROSCOPIC ASSISTED VAGINAL HYSTERECTOMY N/A 01/25/2013   Procedure: LAPAROSCOPIC ASSISTED VAGINAL HYSTERECTOMY;  Surgeon: Olga Millers, MD;  Location: [facility];  Service: Gynecology;  Laterality: N/A;   thumb surgery Left             Vanessa Kick, MD 11/23/21 [phone]

## 2021-12-19 ENCOUNTER — Encounter: Payer: Self-pay | Admitting: Family Medicine

## 2021-12-19 ENCOUNTER — Ambulatory Visit (INDEPENDENT_AMBULATORY_CARE_PROVIDER_SITE_OTHER): Payer: BC Managed Care – PPO | Admitting: Family Medicine

## 2021-12-19 VITALS — BP 132/74 | HR 81 | Temp 98.3°F | Ht 64.0 in | Wt 167.2 lb

## 2021-12-19 DIAGNOSIS — I10 Essential (primary) hypertension: Secondary | ICD-10-CM

## 2021-12-19 DIAGNOSIS — Z114 Encounter for screening for human immunodeficiency virus [HIV]: Secondary | ICD-10-CM

## 2021-12-19 DIAGNOSIS — Z1159 Encounter for screening for other viral diseases: Secondary | ICD-10-CM

## 2021-12-19 DIAGNOSIS — D849 Immunodeficiency, unspecified: Secondary | ICD-10-CM | POA: Diagnosis not present

## 2021-12-19 DIAGNOSIS — E559 Vitamin D deficiency, unspecified: Secondary | ICD-10-CM

## 2021-12-19 DIAGNOSIS — F411 Generalized anxiety disorder: Secondary | ICD-10-CM

## 2021-12-19 DIAGNOSIS — M0600 Rheumatoid arthritis without rheumatoid factor, unspecified site: Secondary | ICD-10-CM

## 2021-12-19 DIAGNOSIS — Z0000 Encounter for general adult medical examination without abnormal findings: Secondary | ICD-10-CM | POA: Diagnosis not present

## 2021-12-19 NOTE — Progress Notes (Signed)
Phone [phone]   Subjective:  Patient presents today for their annual physical. Chief complaint-noted.   See problem oriented charting- ROS- full  review of systems was completed and negative except for: fatigue (recent travel to [place] plus has RA), congestion and sinus pressure with allergies, immunocompromised with RA meds, joint and back pain  The following were reviewed and entered/updated in epic: Past Medical History:  Diagnosis Date   Allergy    Anemia    history of    GERD (gastroesophageal reflux disease)    Heartburn    Hemorrhoids    IBS (irritable bowel syndrome)    Patient Active Problem List   Diagnosis Date Noted   Rheumatoid arthritis ([place]) 10/04/2018    Priority: High   Essential hypertension 07/19/2014    Priority: Medium    Generalized anxiety disorder 03/15/2014    Priority: Medium    IBS 10/25/2009    Priority: Medium    Immunocompromised ([place]) 07/05/2021    Priority: Low   GERD (gastroesophageal reflux disease) 10/04/2018    Priority: Low   Insomnia 10/04/2018    Priority: Low   H/O total hysterectomy 03/27/2018    Priority: Low   Allergic rhinitis 03/26/2013    Priority: Low   Anemia 01/21/2012    Priority: Low   Acute non-recurrent maxillary sinusitis 09/27/2016   Past Surgical History:  Procedure Laterality Date   BILATERAL SALPINGECTOMY Bilateral 01/25/2013   Procedure: BILATERAL SALPINGECTOMY;  Surgeon: Olga Millers, MD;  Location: [facility];  Service: Gynecology;  Laterality: Bilateral;   LAPAROSCOPIC ASSISTED VAGINAL HYSTERECTOMY N/A 01/25/2013   Procedure: LAPAROSCOPIC ASSISTED VAGINAL HYSTERECTOMY;  Surgeon: Olga Millers, MD;  Location: [facility];  Service: Gynecology;  Laterality: N/A;   thumb surgery Left     Family History  Problem Relation Age of Onset   Hypertension Mother    Hyperlipidemia Mother    Colon polyps Mother    Hypertension Sister    Heart disease Maternal Grandmother    Diabetes Maternal Grandmother     Heart disease Maternal Grandfather    Colon cancer Neg Hx    Esophageal cancer Neg Hx    Rectal cancer Neg Hx    Stomach cancer Neg Hx     Medications- reviewed and updated Current Outpatient Medications  Medication Sig Dispense Refill   amLODipine (NORVASC) 2.5 MG tablet Take 1 tablet (2.5 mg total) by mouth daily. 90 tablet 3   azelastine (ASTELIN) 0.1 % nasal spray Place into both nostrils as needed.     azelastine (OPTIVAR) 0.05 % ophthalmic solution Place 1 drop into both eyes as needed.     HUMIRA PEN 40 MG/0.4ML PNKT Inject 1 mL into the muscle every 14 (fourteen) days.      vitamin C (ASCORBIC ACID) 500 MG tablet Take 500 mg by mouth daily. Reported on 10/02/2015     ZINC GLUCONATE PO Take by mouth.     zolpidem (AMBIEN) 5 MG tablet Take 1 tablet (5 mg total) by mouth at bedtime as needed. for sleep 30 tablet 5   No current facility-administered medications for this visit.    Allergies-reviewed and updated No Known Allergies  Social History   Social History Narrative   Single. Lives alone. No kids.       [employer]- still [employer] (but stress just better.    Prior work same and A lot of travel all over country.    [employer] grad. Degree in mass communications and public relations.  Hobbies: time with friends   Objective  Objective:  BP 132/74   Pulse 81   Temp 98.3 F (36.8 C)   Ht '5\' 4"'$  (1.626 m)   Wt 167 lb 3.2 oz (75.8 kg)   LMP 01/06/2013   SpO2 97%   BMI 28.70 kg/m  Gen: NAD, resting comfortably HEENT: Mucous membranes are moist. Oropharynx normal Neck: no thyromegaly CV: RRR no murmurs rubs or gallops Lungs: CTAB no crackles, wheeze, rhonchi Abdomen: soft/nontender/nondistended/normal bowel sounds. No rebound or guarding.  Ext: no edema Skin: warm, dry Neuro: grossly normal, moves all extremities, PERRLA   Assessment and Plan   56 y.o. female presenting for annual physical.  Health Maintenance counseling: 1. Anticipatory  guidance: Patient counseled regarding regular dental exams -q6 months, eye exams - yearly,  avoiding smoking and second hand smoke , limiting alcohol to 1 beverage per day- or less , no illicit drugs.   2. Risk factor reduction:  Advised patient of need for regular exercise and diet rich and fruits and vegetables to reduce risk of heart attack and stroke.  Exercise- trying to do some walking. Did a lot of walking in [place] Diet/weight management-trying to eat reasonably healthy- watching salt. Thinks could improve with increasing veggies. Cut down on candy already may have led to weight loss Wt Readings from Last 3 Encounters:  12/19/21 167 lb 3.2 oz (75.8 kg)  11/20/21 170 lb (77.1 kg)  09/12/21 171 lb 9.6 oz (77.8 kg)  3. Immunizations/screenings/ancillary studies-HIV and hepatitis C screen discussed- opts out, Shingrix vaccination recommended- she will chat with Dr. Trudie Reed, consider COVID-19 updated vaccination in the fall when new vaccination available  Immunization History  Administered Date(s) Administered   Influenza,inj,Quad PF,6+ Mos 06/05/2017, 04/23/2018, 03/23/2019, 07/05/2021   Moderna SARS-COV2 Booster Vaccination 04/22/2020   Moderna Sars-Covid-2 Vaccination 08/25/2019, 09/27/2019   Tdap 04/11/2016  4. Cervical cancer screening- still sees gyn Dr. Stann Mainland- gets pelvic exams but not pap smears with total hysterectomy for benign reasons- saw gyn same day as mammogram 5. Breast cancer screening-  breast exam Dr. Stann Mainland and mammogram 06/11/21 6. Colon cancer screening - 10/02/15 with 10 year repeat 7. Skin cancer screening- no dermatologist- lower risk due to melanin content. advised regular sunscreen use. Denies worrisome, changing, or new skin lesions- has a stable spot on right abdomen and is planning ot call derm just to be on safe side  8. Birth control/STD check- not dating/not sexually active. Hysterectomy and postmenopausal 9. Osteoporosis screening at 13- will plan on this   10.  Smoking associated screening - Never smoker  Status of chronic or acute concerns   #social update- just got back from [place]- jazz festival  #Paronychia-seen in urgent care for left hand paronychia requiring incision and drainage in mid June and discharged on Bactrim-she reports healed nicely- finished antibiotics   #Left lower leg numbness/weakness in addition to left arm-prior to January visit.  Was tending to occur with standing and perhaps 5 or 6 times in a month at that time.  We updated blood work and offered neurology referral. Improved shortly after visit- not sure what improved issue.  - she will let us know if recurrence or similar/different area affected- think risk of MS is low   #Rheumatoid arthritis/immunocompromised status noted-follows with Dr. Trudie Reed S:Medication: Humira q 2 weeks A/P: she reports reasonable control with this- continue medication and rheumatology follow up    #hypertension S: medication: Amlodipine 2.5 Home readings #s: good control at home as well -  tries to be cautious with decongestant as can worsen symptoms BP Readings from Last 3 Encounters:  12/19/21 132/74  11/20/21 134/90  09/12/21 (!) 144/90  A/P: Controlled. Continue current medications.   #Hypertriglyceridemia S: Medication:none The 10-year ASCVD risk score (Arnett DK, et al., 2019) is: 7.2%  - no first degree relatives with heart disease Lab Results  Component Value Date   CHOL 165 07/12/2021   HDL 37.00 (L) 07/12/2021   LDLCALC 90 07/12/2021   TRIG 189.0 (H) 07/12/2021   CHOLHDL 4 07/12/2021   A/P: mild elevations but 10 year risk not substantially elevated- will work on healthy eating/regular exercise   #Insomnia S: Medication: Ambien 5 mg. Takes 9 30 and lays down within an hour for most part- wakes up between 2-4 AM A/P: working well for the most part- continue current meds.      # Anxiety S:Medication: Previously on citalopram 10 mg but anxiety improved with less  stressful job and we weaned off at last visit A/P: anxiety has remained controlled being off meds with improved work situation   #Vitamin D deficiency S: Medication: up to 2000 units was 1000 before Last vitamin D Lab Results  Component Value Date   VD25OH 28.76 (L) 07/12/2021  A/P: suspect improved recheck next physical   Recommended follow up: Return in about 6 months (around 06/21/2022) for followup or sooner if needed.Schedule b4 you leave.  Lab/Order associations: fasting   ICD-10-CM   1. Preventative health care  Z00.00     2. Rheumatoid arthritis with negative rheumatoid factor, involving unspecified site ([place])  M06.00     3. Immunocompromised ([place])  D84.9     4. Generalized anxiety disorder  F41.1     5. Essential hypertension  I10     6. Vitamin D deficiency  E55.9     7. Encounter for hepatitis C screening test for low risk patient  Z11.59     8. Screening for HIV (human immunodeficiency virus)  Z11.4       No orders of the defined types were placed in this encounter.   Return precautions advised.  Garret Reddish, MD

## 2021-12-19 NOTE — Patient Instructions (Addendum)
Health Maintenance Due  Topic Date Due   Zoster Vaccines- Shingrix (1 of 2) Never done  Can chat with Dr. Trudie Reed about timing of shingrix- can call back for nurse visit  Hold off on labs today- definitely do at least annually  Recommended follow up: Return in about 6 months (around 06/21/2022) for followup or sooner if needed.Schedule b4 you leave.

## 2022-01-20 ENCOUNTER — Other Ambulatory Visit: Payer: Self-pay | Admitting: Family Medicine

## 2022-02-24 ENCOUNTER — Encounter (HOSPITAL_COMMUNITY): Payer: Self-pay

## 2022-02-24 ENCOUNTER — Ambulatory Visit (HOSPITAL_COMMUNITY)
Admission: RE | Admit: 2022-02-24 | Discharge: 2022-02-24 | Disposition: A | Discharge status: Home / Self Care | Payer: BC Managed Care – PPO | Source: Ambulatory Visit

## 2022-02-24 ENCOUNTER — Ambulatory Visit (INDEPENDENT_AMBULATORY_CARE_PROVIDER_SITE_OTHER): Payer: BC Managed Care – PPO

## 2022-02-24 VITALS — BP 130/87 | HR 75 | Temp 98.4°F | Resp 16

## 2022-02-24 DIAGNOSIS — M542 Cervicalgia: Secondary | ICD-10-CM | POA: Diagnosis not present

## 2022-02-24 DIAGNOSIS — R29898 Other symptoms and signs involving the musculoskeletal system: Secondary | ICD-10-CM

## 2022-02-24 DIAGNOSIS — G8929 Other chronic pain: Secondary | ICD-10-CM

## 2022-02-24 DIAGNOSIS — R221 Localized swelling, mass and lump, neck: Secondary | ICD-10-CM | POA: Diagnosis not present

## 2022-02-24 DIAGNOSIS — M62838 Other muscle spasm: Secondary | ICD-10-CM | POA: Diagnosis not present

## 2022-02-24 DIAGNOSIS — M50122 Cervical disc disorder at C5-C6 level with radiculopathy: Secondary | ICD-10-CM | POA: Diagnosis not present

## 2022-02-24 DIAGNOSIS — M5441 Lumbago with sciatica, right side: Secondary | ICD-10-CM

## 2022-02-24 DIAGNOSIS — M4722 Other spondylosis with radiculopathy, cervical region: Secondary | ICD-10-CM | POA: Diagnosis not present

## 2022-02-24 DIAGNOSIS — M5442 Lumbago with sciatica, left side: Secondary | ICD-10-CM | POA: Diagnosis not present

## 2022-02-24 DIAGNOSIS — M4802 Spinal stenosis, cervical region: Secondary | ICD-10-CM | POA: Diagnosis not present

## 2022-02-24 MED ORDER — METHOCARBAMOL 500 MG PO TABS
500.0000 mg | ORAL_TABLET | Freq: Three times a day (TID) | ORAL | 0 refills | Status: DC | PRN
Start: 2022-02-24 — End: 2022-11-19
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
## 2022-02-24 NOTE — Discharge Instructions (Signed)
Your x-ray was normal.  I believe that your pain is related to a spasm in your trapezius muscle.  I do think it is important that you follow-up with orthopedics as soon as possible.  Please call to schedule an appointment with them.  Use Robaxin up to 3 times a day for pain relief.  This make you sleepy do not drive or drink alcohol while taking it.  Continue your Celebrex and Tylenol as prescribed.  Use heat and gentle stretch for symptom relief.  If you have any worsening symptoms you need to be seen immediately.

## 2022-02-24 NOTE — ED Triage Notes (Signed)
Patient having shoulder and back pian onset 2 weeks. Starts in the right deltoid muscle and radiating into the forearm and neck. States hard to write with the right han and keep her grip.   Right side sciatic pain. No falls or injuries currently.

## 2022-02-25 ENCOUNTER — Encounter: Payer: Self-pay | Admitting: Family Medicine

## 2022-02-26 ENCOUNTER — Other Ambulatory Visit: Payer: Self-pay

## 2022-02-26 DIAGNOSIS — R202 Paresthesia of skin: Secondary | ICD-10-CM

## 2022-02-27 ENCOUNTER — Encounter: Payer: Self-pay | Admitting: Neurology

## 2022-03-03 ENCOUNTER — Encounter: Payer: Self-pay | Admitting: *Deleted

## 2022-03-21 DIAGNOSIS — M0600 Rheumatoid arthritis without rheumatoid factor, unspecified site: Secondary | ICD-10-CM | POA: Diagnosis not present

## 2022-03-21 DIAGNOSIS — R5383 Other fatigue: Secondary | ICD-10-CM | POA: Diagnosis not present

## 2022-03-21 DIAGNOSIS — Z79899 Other long term (current) drug therapy: Secondary | ICD-10-CM | POA: Diagnosis not present

## 2022-03-21 DIAGNOSIS — M1991 Primary osteoarthritis, unspecified site: Secondary | ICD-10-CM | POA: Diagnosis not present

## 2022-03-25 DIAGNOSIS — M5416 Radiculopathy, lumbar region: Secondary | ICD-10-CM | POA: Diagnosis not present

## 2022-03-25 DIAGNOSIS — M5412 Radiculopathy, cervical region: Secondary | ICD-10-CM | POA: Diagnosis not present

## 2022-04-02 DIAGNOSIS — M542 Cervicalgia: Secondary | ICD-10-CM | POA: Diagnosis not present

## 2022-04-11 DIAGNOSIS — R519 Headache, unspecified: Secondary | ICD-10-CM | POA: Diagnosis not present

## 2022-04-11 DIAGNOSIS — M546 Pain in thoracic spine: Secondary | ICD-10-CM | POA: Diagnosis not present

## 2022-04-15 DIAGNOSIS — M546 Pain in thoracic spine: Secondary | ICD-10-CM | POA: Diagnosis not present

## 2022-04-15 DIAGNOSIS — M542 Cervicalgia: Secondary | ICD-10-CM | POA: Diagnosis not present

## 2022-04-22 DIAGNOSIS — M5412 Radiculopathy, cervical region: Secondary | ICD-10-CM | POA: Diagnosis not present

## 2022-04-22 DIAGNOSIS — M5416 Radiculopathy, lumbar region: Secondary | ICD-10-CM | POA: Diagnosis not present

## 2022-04-22 DIAGNOSIS — M6281 Muscle weakness (generalized): Secondary | ICD-10-CM | POA: Diagnosis not present

## 2022-04-24 DIAGNOSIS — M5412 Radiculopathy, cervical region: Secondary | ICD-10-CM | POA: Diagnosis not present

## 2022-04-24 DIAGNOSIS — M5416 Radiculopathy, lumbar region: Secondary | ICD-10-CM | POA: Diagnosis not present

## 2022-04-24 DIAGNOSIS — M6281 Muscle weakness (generalized): Secondary | ICD-10-CM | POA: Diagnosis not present

## 2022-04-28 DIAGNOSIS — M5416 Radiculopathy, lumbar region: Secondary | ICD-10-CM | POA: Diagnosis not present

## 2022-04-28 DIAGNOSIS — M5412 Radiculopathy, cervical region: Secondary | ICD-10-CM | POA: Diagnosis not present

## 2022-04-28 DIAGNOSIS — M6281 Muscle weakness (generalized): Secondary | ICD-10-CM | POA: Diagnosis not present

## 2022-04-30 ENCOUNTER — Ambulatory Visit (INDEPENDENT_AMBULATORY_CARE_PROVIDER_SITE_OTHER): Payer: BC Managed Care – PPO | Admitting: Neurology

## 2022-04-30 ENCOUNTER — Encounter: Payer: Self-pay | Admitting: Neurology

## 2022-04-30 VITALS — BP 144/87 | HR 85 | Ht 64.0 in | Wt 170.0 lb

## 2022-04-30 DIAGNOSIS — D649 Anemia, unspecified: Secondary | ICD-10-CM | POA: Diagnosis not present

## 2022-04-30 DIAGNOSIS — R9089 Other abnormal findings on diagnostic imaging of central nervous system: Secondary | ICD-10-CM | POA: Diagnosis not present

## 2022-04-30 DIAGNOSIS — E569 Vitamin deficiency, unspecified: Secondary | ICD-10-CM | POA: Diagnosis not present

## 2022-04-30 DIAGNOSIS — M069 Rheumatoid arthritis, unspecified: Secondary | ICD-10-CM | POA: Diagnosis not present

## 2022-04-30 DIAGNOSIS — G0491 Myelitis, unspecified: Secondary | ICD-10-CM | POA: Diagnosis not present

## 2022-04-30 DIAGNOSIS — G35 Multiple sclerosis: Secondary | ICD-10-CM | POA: Diagnosis not present

## 2022-04-30 NOTE — Patient Instructions (Signed)
MRI cervical spine with contrast  Check labs  Please obtain images on CD from [facility]

## 2022-04-30 NOTE — Progress Notes (Unsigned)
[facility] Clinic Note - Initial Visit   Date: 04/30/2022   Karla Howell MRN: [id number] DOB: 03/21/1966   Dear Dr. Yong Channel:  Thank you for your kind referral of Karla Howell for consultation of numbness/tingling. Although her history is well known to you, please allow [place] to reiterate it for the purpose of our medical record. The patient was accompanied to the clinic by self.    Karla Howell is a 55 y.o. right-handed female with hypertension and rheumatoid arthritis presenting for evaluation of bilateral leg numbness/tingling.   IMPRESSION/PLAN: Cervical cord abnormality concerning for myelitis.  MRI shows T2 hyperintensity involving the cervical >> thoracic cord.  MRI brain is normal which argues against multiple sclerosis.  She denies vision symptoms.  To further evaluation, I will order contrasted MRI cervical spine and labs as noted below.  Based on these results, additional CSF testing may be likely.  Check ESR, CRP, copper, ceruloplasmin, zinc, vitamin E, vitamin B12, folate, TSH, SPEP with IFE, ACE, ANA/ENA panel I have asked patient to provide MRI images on CD for my review  Further recommendations pending results.  ------------------------------------------------------------- History of present illness: In July 2023, she was walking 1-2 miles and at the end of her walk, she began to have numbness and tingling involving the lower legs.  Symptoms lasted 5-10 minutes and self-resolved.  Her symptoms have become constant since October and she also has tightness in the legs.  She takes gabapentin 120m during the day as needed and 3018mat bedtime which provides minimal relief.  Right arm feels a little weaker.  No weakness in the legs.   In September, she began having right sided shoulder, neck, and arm pain. She was also having numbness into the right hand, especially the thumb and index finger.  She has tingling in all  of the fingers.  No numbness/tingling in the left hand. She has weakness in the right arm.  She is going to PT.    She saw Dr. IbRon Ageet MuHaynesor these symptoms and specifically the right sided neck pain.  He ordered MRI cervical spine which showed T2 hyperintensity involving the cervical cord at C3-4 to C4-5.  Subsequent imaging of the thoracic cord shows similar changes at T3 and T12.  MRI brain was normal.  She is here for further evaluation.   She denies cramps.  Earlier in 2023, she had 4-5 spells of left arm and leg drawing up.   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  No children.   Out-side paper records, electronic medical record, and images have been reviewed where available and summarized as:  MRI brain wwo contrast 04/11/2022:  Normal  MRI cervical spine wo contrast 04/02/2022:    Multiple areas of short-segment eccentric T2/FLAIR hyperintensity within the cord extending from C3-4 to C4-5.  Additional non expansile short segment T2/FLAIR hyperintensity within the right eccentric cord at C7.  Question possible subtle abnormal cord signal in the upper thoracic cord. Question cerebellar tonsillar ectopia.    MRI thoracic spine wwo contrast 04/11/2022 Patchy T2 cord signal abnormality at the midline anteriorly at T3 and in th eleft aspect of the conus at T12 may reflect demyelinating lesions.  No abnormal enhancement to suggest active demyelination.   Lab Results  Component Value Date   HGBA1C 5.1 03/17/2017   Lab Results  Component Value Date   VITAMINB12 637 07/12/2021   Lab Results  Component Value Date  TSH 2.58 07/12/2021   No results found for: "ESRSEDRATE", "POCTSEDRATE"  Past Medical History:  Diagnosis Date   Allergy    Anemia    history of    GERD (gastroesophageal reflux disease)    Heartburn    Hemorrhoids    IBS (irritable bowel syndrome)     Past Surgical History:  Procedure Laterality Date   BILATERAL  SALPINGECTOMY Bilateral 01/25/2013   Procedure: BILATERAL SALPINGECTOMY;  Surgeon: Olga Millers, MD;  Location: [facility];  Service: Gynecology;  Laterality: Bilateral;   LAPAROSCOPIC ASSISTED VAGINAL HYSTERECTOMY N/A 01/25/2013   Procedure: LAPAROSCOPIC ASSISTED VAGINAL HYSTERECTOMY;  Surgeon: Olga Millers, MD;  Location: [facility];  Service: Gynecology;  Laterality: N/A;   thumb surgery Left      Medications:  Outpatient Encounter Medications as of 04/30/2022  Medication Sig   amLODipine (NORVASC) 2.5 MG tablet Take 1 tablet (2.5 mg total) by mouth daily.   azelastine (ASTELIN) 0.1 % nasal spray Place into both nostrils as needed.   azelastine (OPTIVAR) 0.05 % ophthalmic solution Place 1 drop into both eyes as needed.   celecoxib (CELEBREX) 200 MG capsule 1 CAPSULE WITH FOOD ORALLY TWICE A DAY AS NEEDED   gabapentin (NEURONTIN) 100 MG capsule Take one capsule daily.   gabapentin (NEURONTIN) 300 MG capsule Take one capsule at bedtime   HUMIRA PEN 40 MG/0.4ML PNKT Inject 1 mL into the muscle every 14 (fourteen) days.    methocarbamol (ROBAXIN) 500 MG tablet Take 1 tablet (500 mg total) by mouth every 8 (eight) hours as needed for muscle spasms.   vitamin C (ASCORBIC ACID) 500 MG tablet Take 500 mg by mouth daily. Reported on 10/02/2015   ZINC GLUCONATE PO Take by mouth.   zolpidem (AMBIEN) 5 MG tablet TAKE 1 TABLET (5 MG TOTAL) BY MOUTH EVERY DAY AT BEDTIME AS NEEDED FOR SLEEP   No facility-administered encounter medications on file as of 04/30/2022.    Allergies: No Known Allergies  Family History: Family History  Problem Relation Age of Onset   Hypertension Mother    Hyperlipidemia Mother    Colon polyps Mother    Hypertension Sister    Heart disease Maternal Grandmother    Diabetes Maternal Grandmother    Heart disease Maternal Grandfather    Colon cancer Neg Hx    Esophageal cancer Neg Hx    Rectal cancer Neg Hx    Stomach cancer Neg Hx     Social History: Social History    Tobacco Use   Smoking status: Never   Smokeless tobacco: Never  Substance Use Topics   Alcohol use: Yes    Alcohol/week: 7.0 standard drinks of alcohol    Types: 7 Standard drinks or equivalent per week    Comment: socially   Drug use: No   Social History   Social History Narrative   Single. Lives alone. No kids.       [employer]- still [employer] (but stress just better.    Prior work same and A lot of travel all over country.    [employer] grad. Degree in mass communications and public relations.       Hobbies: time with friends      Right handed    Lives in a two story home    Vital Signs:  BP (!) 144/87   Pulse 85   Ht _0  (1.626 m)   Wt 170 lb (77.1 kg)   LMP 01/06/2013   SpO2 94%  BMI 29.18 kg/m   Neurological Exam: MENTAL STATUS including orientation to time, place, person, recent and remote memory, attention span and concentration, language, and fund of knowledge is normal.  Speech is not dysarthric.  CRANIAL NERVES: II:  No visual field defects.    III-IV-VI: Pupils equal round and reactive to light.  Normal conjugate, extra-ocular eye movements in all directions of gaze.  No nystagmus.  No ptosis.   V:  Normal facial sensation.    VII:  Normal facial symmetry and movements.   VIII:  Normal hearing and vestibular function.   IX-X:  Normal palatal movement.   XI:  Normal shoulder shrug and head rotation.   XII:  Normal tongue strength and range of motion, no deviation or fasciculation.  MOTOR:  No atrophy, fasciculations or abnormal movements.  No pronator drift.   Upper Extremity:  Right  Left  Deltoid  5/5   5/5   Biceps  5/5   5/5   Triceps  5/5   5/5   Wrist extensors  5/5   5/5   Wrist flexors  5/5   5/5   Finger extensors  5/5   5/5   Finger flexors  5/5   5/5   Dorsal interossei  5/5   5/5   Abductor pollicis  5/5   5/5   Tone (Ashworth scale)  0  0   Lower Extremity:  Right  Left  Hip flexors  5/5   5/5   Knee flexors   5/5   5/5   Knee extensors  5/5   5/5   Dorsiflexors  5/5   5/5   Plantarflexors  5/5   5/5   Toe extensors  5/5   5/5   Toe flexors  5/5   5/5   Tone (Ashworth scale)  0  0   MSRs:                                           Right        Left brachioradialis 2+  2+  biceps 2+  2+  triceps 2+  2+  patellar 3+  3+  ankle jerk 2+  2+  Hoffman yes  yes  plantar response down  down  2/3 beat ankle clonus bilaterally  SENSORY:  Vibration, pin prick, and temperature reduced below the ankles. Sensation to all modalities intact in the arms. Rhomberg testing intact.    COORDINATION/GAIT: Normal finger-to- nose-finger. Intact rapid alternating movements bilaterally.  Gait narrow based and stable. Tandem and stressed gait intact.   Total time spent reviewing records, interview, history/exam, documentation, and coordination of care on day of encounter:  60 min   Thank you for allowing me to participate in patient's care.  If I can answer any additional questions, I would be pleased to do so.    Sincerely,    Joselyn Edling K. Posey Pronto, DO

## 2022-05-01 LAB — ANGIOTENSIN CONVERTING ENZYME: Angio Convert Enzyme: 59 U/L (ref 14–82)

## 2022-05-01 LAB — CERULOPLASMIN: Ceruloplasmin: 28.4 mg/dL (ref 19.0–39.0)

## 2022-05-01 LAB — TSH: TSH: 1.71 u[IU]/mL (ref 0.450–4.500)

## 2022-05-01 LAB — B12 AND FOLATE PANEL
Folate: 17.2 ng/mL (ref >3.0)
Vitamin B-12: 902 pg/mL (ref 232–1245)

## 2022-05-01 LAB — SEDIMENTATION RATE: Sed Rate: 73 mm/hr — ABNORMAL HIGH (ref 0–40)

## 2022-05-05 ENCOUNTER — Telehealth: Payer: Self-pay | Admitting: Neurology

## 2022-05-05 DIAGNOSIS — M5416 Radiculopathy, lumbar region: Secondary | ICD-10-CM | POA: Diagnosis not present

## 2022-05-05 DIAGNOSIS — M5412 Radiculopathy, cervical region: Secondary | ICD-10-CM | POA: Diagnosis not present

## 2022-05-05 DIAGNOSIS — M6281 Muscle weakness (generalized): Secondary | ICD-10-CM | POA: Diagnosis not present

## 2022-05-05 LAB — VITAMIN E
Vitamin E (Alpha Tocopherol): 8.8 mg/L (ref 7.0–25.1)
Vitamin E(Gamma Tocopherol): 2 mg/L (ref 0.5–5.5)

## 2022-05-05 NOTE — Telephone Encounter (Signed)
Called patient and informed her that Dr. Posey Pronto was not the ordering physician of the MRI that she is trying to get on CD and we are unable to request it. Advised patient to call the ordering physician back and let their office know that they need to assist her with getting her results. Informed patient to call me back if there are any issues.

## 2022-05-05 NOTE — Telephone Encounter (Signed)
Pt called in stating she tried to get her MRI results on a CD, but they told her our office would have to get them through [facility] and gave [place] account number: [phone]

## 2022-05-06 LAB — PROTEIN ELECTROPHORESIS, SERUM
A/G Ratio: 1.1 (ref 0.7–1.7)
Albumin ELP: 3.9 g/dL (ref 2.9–4.4)
Alpha 1: 0.2 g/dL (ref 0.0–0.4)
Alpha 2: 0.7 g/dL (ref 0.4–1.0)
Beta: 1.3 g/dL (ref 0.7–1.3)
Gamma Globulin: 1.3 g/dL (ref 0.4–1.8)
Globulin, Total: 3.5 g/dL (ref 2.2–3.9)
Total Protein: 7.4 g/dL (ref 6.0–8.5)

## 2022-05-06 LAB — IMMUNOFIXATION ELECTROPHORESIS
IgA/Immunoglobulin A, Serum: 441 mg/dL — ABNORMAL HIGH (ref 87–352)
IgG (Immunoglobin G), Serum: 1367 mg/dL (ref 586–1602)
IgM (Immunoglobulin M), Srm: 179 mg/dL (ref 26–217)
Total Protein: 7.3 g/dL (ref 6.0–8.5)

## 2022-05-07 ENCOUNTER — Telehealth: Payer: Self-pay

## 2022-05-07 DIAGNOSIS — R9089 Other abnormal findings on diagnostic imaging of central nervous system: Secondary | ICD-10-CM

## 2022-05-07 DIAGNOSIS — G0491 Myelitis, unspecified: Secondary | ICD-10-CM

## 2022-05-07 NOTE — Telephone Encounter (Signed)
Patient called and left a voicemail wanting results and recommendations of her labs.

## 2022-05-08 NOTE — Telephone Encounter (Signed)
Called patient and left a message for a call back.  

## 2022-05-08 NOTE — Telephone Encounter (Signed)
Patient returned call and was informed of results and recommendations. Patient would like to proceed with Lumbar puncture and is aware [facility] will give her a call to get her scheduled.   Patient wanted to ask Dr. Posey Pronto is she still wanted her to get the MRI cervical with contrast done?  Also, patient stated she saw her Orthopedic doctor and was put on Gabapentin '300mg'$  at night and Celebrex. Patient wanted to ask Dr. Posey Pronto if it is ok to continue taking these medications?   Patient is aware I will call her back.

## 2022-05-08 NOTE — Telephone Encounter (Signed)
Please let pt know that there are signs of inflammation on her labs.  I have also reviewed her imagines and there is changes within the cord that we need to further investigate.    I recommend doing a lumbar puncture.  If she is agreeable, please order CSF cell count and diff, protein, glucose, IgG index, oligoclonal bands, myelin basic protein, flow cytology, ACE.   Also, can you follow-up on ANA panel, copper, zinc?

## 2022-05-08 NOTE — Telephone Encounter (Signed)
Yes, I do want her to get MRI with contrast - this needs to be scheduled prior to getting LP.   She may continue to take medications as prescribed.

## 2022-05-09 ENCOUNTER — Other Ambulatory Visit: Payer: Self-pay

## 2022-05-09 DIAGNOSIS — G0491 Myelitis, unspecified: Secondary | ICD-10-CM

## 2022-05-09 DIAGNOSIS — R9089 Other abnormal findings on diagnostic imaging of central nervous system: Secondary | ICD-10-CM

## 2022-05-09 NOTE — Telephone Encounter (Signed)
Heard back from [place] in regards to patients ANA, Copper, CRP, and Zinc lab results. She states that she was off on that day that patient came and thinks it may have been missed. She reccommended patient come back to have labs re-drawn.

## 2022-05-09 NOTE — Telephone Encounter (Signed)
Called patient and informed her of Dr. Serita Grit advice and recommendations. Patient will call [facility] to get scheduled for her MRI. Provided patient with [facility] phone number. Patient verbalized understanding and had no further questions or concerns.

## 2022-05-11 ENCOUNTER — Inpatient Hospital Stay (HOSPITAL_COMMUNITY): Payer: BC Managed Care – PPO

## 2022-05-11 ENCOUNTER — Inpatient Hospital Stay (HOSPITAL_BASED_OUTPATIENT_CLINIC_OR_DEPARTMENT_OTHER)
Admission: EM | Admit: 2022-05-11 | Discharge: 2022-05-16 | DRG: 058 | Disposition: A | Discharge status: Home / Self Care | Payer: BC Managed Care – PPO | Attending: Internal Medicine | Admitting: Internal Medicine

## 2022-05-11 ENCOUNTER — Encounter (HOSPITAL_COMMUNITY): Payer: Self-pay | Admitting: Internal Medicine

## 2022-05-11 ENCOUNTER — Encounter (HOSPITAL_COMMUNITY): Payer: Self-pay

## 2022-05-11 ENCOUNTER — Other Ambulatory Visit: Payer: Self-pay

## 2022-05-11 DIAGNOSIS — R269 Unspecified abnormalities of gait and mobility: Secondary | ICD-10-CM | POA: Diagnosis not present

## 2022-05-11 DIAGNOSIS — Z79620 Long term (current) use of immunosuppressive biologic: Secondary | ICD-10-CM

## 2022-05-11 DIAGNOSIS — Z83438 Family history of other disorder of lipoprotein metabolism and other lipidemia: Secondary | ICD-10-CM

## 2022-05-11 DIAGNOSIS — G35 Multiple sclerosis: Principal | ICD-10-CM | POA: Diagnosis present

## 2022-05-11 DIAGNOSIS — G0491 Myelitis, unspecified: Secondary | ICD-10-CM | POA: Diagnosis not present

## 2022-05-11 DIAGNOSIS — Z8249 Family history of ischemic heart disease and other diseases of the circulatory system: Secondary | ICD-10-CM | POA: Diagnosis not present

## 2022-05-11 DIAGNOSIS — Z79899 Other long term (current) drug therapy: Secondary | ICD-10-CM

## 2022-05-11 DIAGNOSIS — Z83719 Family history of colon polyps, unspecified: Secondary | ICD-10-CM

## 2022-05-11 DIAGNOSIS — Z833 Family history of diabetes mellitus: Secondary | ICD-10-CM

## 2022-05-11 DIAGNOSIS — G9389 Other specified disorders of brain: Secondary | ICD-10-CM | POA: Diagnosis not present

## 2022-05-11 DIAGNOSIS — K649 Unspecified hemorrhoids: Secondary | ICD-10-CM | POA: Diagnosis present

## 2022-05-11 DIAGNOSIS — I10 Essential (primary) hypertension: Secondary | ICD-10-CM | POA: Diagnosis present

## 2022-05-11 DIAGNOSIS — K589 Irritable bowel syndrome without diarrhea: Secondary | ICD-10-CM | POA: Diagnosis present

## 2022-05-11 DIAGNOSIS — M069 Rheumatoid arthritis, unspecified: Secondary | ICD-10-CM | POA: Diagnosis not present

## 2022-05-11 DIAGNOSIS — G35D Multiple sclerosis, unspecified: Secondary | ICD-10-CM

## 2022-05-11 DIAGNOSIS — G253 Myoclonus: Secondary | ICD-10-CM | POA: Diagnosis present

## 2022-05-11 DIAGNOSIS — R262 Difficulty in walking, not elsewhere classified: Secondary | ICD-10-CM | POA: Diagnosis not present

## 2022-05-11 DIAGNOSIS — K219 Gastro-esophageal reflux disease without esophagitis: Secondary | ICD-10-CM | POA: Diagnosis present

## 2022-05-11 HISTORY — DX: Rheumatoid arthritis, unspecified: M06.9

## 2022-05-11 HISTORY — DX: Essential (primary) hypertension: I10

## 2022-05-11 LAB — BASIC METABOLIC PANEL
Anion gap: 8 (ref 5–15)
BUN: 14 mg/dL (ref 6–20)
CO2: 28 mmol/L (ref 22–32)
Calcium: 9.1 mg/dL (ref 8.9–10.3)
Chloride: 105 mmol/L (ref 98–111)
Creatinine, Ser: 1.02 mg/dL — ABNORMAL HIGH (ref 0.44–1.00)
GFR, Estimated: >60 mL/min (ref >60)
Glucose, Bld: 97 mg/dL (ref 70–99)
Potassium: 4.2 mmol/L (ref 3.5–5.1)
Sodium: 141 mmol/L (ref 135–145)

## 2022-05-11 LAB — CBC WITH DIFFERENTIAL/PLATELET
Abs Immature Granulocytes: 0.01 10*3/uL (ref 0.00–0.07)
Basophils Absolute: 0 10*3/uL (ref 0.0–0.1)
Basophils Relative: 1 %
Eosinophils Absolute: 0.1 10*3/uL (ref 0.0–0.5)
Eosinophils Relative: 3 %
HCT: 37.8 % (ref 36.0–46.0)
Hemoglobin: 12.1 g/dL (ref 12.0–15.0)
Immature Granulocytes: 0 %
Lymphocytes Relative: 43 %
Lymphs Abs: 1.9 10*3/uL (ref 0.7–4.0)
MCH: 28.7 pg (ref 26.0–34.0)
MCHC: 32 g/dL (ref 30.0–36.0)
MCV: 89.8 fL (ref 80.0–100.0)
Monocytes Absolute: 0.4 10*3/uL (ref 0.1–1.0)
Monocytes Relative: 9 %
Neutro Abs: 2 10*3/uL (ref 1.7–7.7)
Neutrophils Relative %: 44 %
Platelets: 275 10*3/uL (ref 150–400)
RBC: 4.21 MIL/uL (ref 3.87–5.11)
RDW: 12.4 % (ref 11.5–15.5)
WBC: 4.4 10*3/uL (ref 4.0–10.5)
nRBC: 0 % (ref 0.0–0.2)

## 2022-05-11 LAB — SEDIMENTATION RATE: Sed Rate: 44 mm/hr — ABNORMAL HIGH (ref 0–22)

## 2022-05-11 LAB — HIV ANTIBODY (ROUTINE TESTING W REFLEX): HIV Screen 4th Generation wRfx: NONREACTIVE

## 2022-05-11 LAB — C-REACTIVE PROTEIN: CRP: 0.6 mg/dL (ref <1.0)

## 2022-05-11 MED ORDER — MORPHINE SULFATE (PF) 2 MG/ML IV SOLN: 2.0000 mg | INTRAVENOUS | Status: DC | PRN

## 2022-05-11 MED ORDER — SODIUM CHLORIDE 0.9 % IV SOLN
1000.0000 mg | Freq: Every day | INTRAVENOUS | Status: AC
  Administered 2022-05-12 – 2022-05-15 (×5): 1000 mg via INTRAVENOUS
  Filled 2022-05-11 (×5): qty 16

## 2022-05-11 MED ORDER — ACETAMINOPHEN 325 MG PO TABS
650.0000 mg | ORAL_TABLET | Freq: Four times a day (QID) | ORAL | Status: DC | PRN
  Administered 2022-05-13: 650 mg via ORAL
  Filled 2022-05-11: qty 2

## 2022-05-11 MED ORDER — METHOCARBAMOL 500 MG PO TABS: 500.0000 mg | ORAL_TABLET | Freq: Three times a day (TID) | ORAL | Status: DC | PRN

## 2022-05-11 MED ORDER — ONDANSETRON HCL 4 MG/2ML IJ SOLN: 4.0000 mg | Freq: Four times a day (QID) | INTRAMUSCULAR | Status: DC | PRN

## 2022-05-11 MED ORDER — POLYETHYLENE GLYCOL 3350 17 G PO PACK: 17.0000 g | PACK | Freq: Every day | ORAL | Status: DC | PRN

## 2022-05-11 MED ORDER — DOCUSATE SODIUM 100 MG PO CAPS
100.0000 mg | ORAL_CAPSULE | Freq: Two times a day (BID) | ORAL | Status: DC
  Administered 2022-05-12 – 2022-05-16 (×9): 100 mg via ORAL
  Filled 2022-05-11 (×10): qty 1

## 2022-05-11 MED ORDER — ACETAMINOPHEN 650 MG RE SUPP: 650.0000 mg | Freq: Four times a day (QID) | RECTAL | Status: DC | PRN

## 2022-05-11 MED ORDER — OXYCODONE HCL 5 MG PO TABS
5.0000 mg | ORAL_TABLET | ORAL | Status: DC | PRN
  Filled 2022-05-11: qty 1

## 2022-05-11 MED ORDER — ZOLPIDEM TARTRATE 5 MG PO TABS
5.0000 mg | ORAL_TABLET | Freq: Every evening | ORAL | Status: DC | PRN
  Administered 2022-05-12 – 2022-05-15 (×4): 5 mg via ORAL
  Filled 2022-05-11 (×4): qty 1

## 2022-05-11 MED ORDER — GADOBUTROL 1 MMOL/ML IV SOLN
7.7000 mL | Freq: Once | INTRAVENOUS | Status: AC | PRN
  Administered 2022-05-11: 7.7 mL via INTRAVENOUS

## 2022-05-11 MED ORDER — AMLODIPINE BESYLATE 2.5 MG PO TABS
2.5000 mg | ORAL_TABLET | Freq: Every day | ORAL | Status: DC
  Administered 2022-05-12 – 2022-05-16 (×5): 2.5 mg via ORAL
  Filled 2022-05-11 (×5): qty 1

## 2022-05-11 MED ORDER — HYDRALAZINE HCL 20 MG/ML IJ SOLN: 5.0000 mg | INTRAMUSCULAR | Status: DC | PRN

## 2022-05-11 MED ORDER — ONDANSETRON HCL 4 MG PO TABS: 4.0000 mg | ORAL_TABLET | Freq: Four times a day (QID) | ORAL | Status: DC | PRN

## 2022-05-11 MED ORDER — GABAPENTIN 300 MG PO CAPS
300.0000 mg | ORAL_CAPSULE | Freq: Every day | ORAL | Status: DC
  Administered 2022-05-11 – 2022-05-15 (×5): 300 mg via ORAL
  Filled 2022-05-11 (×5): qty 1

## 2022-05-11 MED ORDER — BISACODYL 5 MG PO TBEC: 5.0000 mg | DELAYED_RELEASE_TABLET | Freq: Every day | ORAL | Status: DC | PRN

## 2022-05-11 MED ORDER — GABAPENTIN 100 MG PO CAPS
100.0000 mg | ORAL_CAPSULE | Freq: Every day | ORAL | Status: DC
  Administered 2022-05-12 – 2022-05-16 (×5): 100 mg via ORAL
  Filled 2022-05-11 (×5): qty 1

## 2022-05-11 NOTE — ED Provider Notes (Signed)
[facility] DEPT Provider Note   CSN: [id number] Arrival date & time: 05/11/22  0518     History  Chief Complaint  Patient presents with   Leg Problem    Karla Howell is a 56 y.o. female.  The history is provided by the patient.  She has history of rheumatoid arthritis and is currently being evaluated for possible cervical myelitis and comes in because of progressive numbness in her legs which is making it difficult for her to walk.  She states when something touches her legs it feels like it has touched a bubble and not really touched her leg.  This has been gradually progressive over the last 5-6 months.  She had seen a neurologist on 11/22 with concern for myelitis.  She had an MRI of the cervical spine with contrast scheduled for 12/16, but states that she is getting worse and does not think she can wait that long.  She denies any bowel or bladder problem.   Home Medications Prior to Admission medications   Medication Sig Start Date End Date Taking? Authorizing Provider  amLODipine (NORVASC) 2.5 MG tablet Take 1 tablet (2.5 mg total) by mouth daily. 07/19/21   Marin Olp, MD  azelastine (ASTELIN) 0.1 % nasal spray Place into both nostrils as needed.    [provider]  azelastine (OPTIVAR) 0.05 % ophthalmic solution Place 1 drop into both eyes as needed.    [provider]  celecoxib (CELEBREX) 200 MG capsule 1 CAPSULE WITH FOOD ORALLY TWICE A DAY AS NEEDED    [provider]  gabapentin (NEURONTIN) 100 MG capsule Take one capsule daily. 04/15/22   [provider]  gabapentin (NEURONTIN) 300 MG capsule Take one capsule at bedtime 04/15/22   [provider]  HUMIRA PEN 40 MG/0.4ML PNKT Inject 1 mL into the muscle every 14 (fourteen) days.  02/19/18   [provider]  methocarbamol (ROBAXIN) 500 MG tablet Take 1 tablet (500 mg total) by mouth every 8 (eight) hours as needed for muscle spasms.  02/24/22   Raspet, Derry Skill, PA-C  vitamin C (ASCORBIC ACID) 500 MG tablet Take 500 mg by mouth daily. Reported on 10/02/2015    [provider]  ZINC GLUCONATE PO Take by mouth.    [provider]  zolpidem (AMBIEN) 5 MG tablet TAKE 1 TABLET (5 MG TOTAL) BY MOUTH EVERY DAY AT BEDTIME AS NEEDED FOR SLEEP 01/21/22   Marin Olp, MD      Allergies    Patient has no known allergies.    Review of Systems   Review of Systems  All other systems reviewed and are negative.   Physical Exam Updated Vital Signs BP (!) 143/90 (BP Location: Right Arm)   Pulse (!) 106   Temp 98.3 F (36.8 C) (Oral)   Resp 18   LMP 01/06/2013   SpO2 98%  Physical Exam Vitals and nursing note reviewed.   56 year old female, resting comfortably and in no acute distress. Vital signs are significant for borderline elevated blood pressure and borderline elevated heart rate. Oxygen saturation is 98%, which is normal. Head is normocephalic and atraumatic. PERRLA, EOMI. Oropharynx is clear. Neck is nontender and supple without adenopathy or JVD. Back is nontender and there is no CVA tenderness. Lungs are clear without rales, wheezes, or rhonchi. Chest is nontender. Heart has regular rate and rhythm without murmur. Abdomen is soft, flat, nontender. Extremities have no cyanosis or edema, full range of  motion is present. Skin is warm and dry without rash. Neurologic: Awake and alert and oriented.  Cranial nerves are intact.  There is slight decreased sensation in the legs compared to the arms.  Strength is 5/5 for knee flexion, knee extension bilaterally, but 4/5 with hip flexion bilaterally.  Arm strength is 5/5.  Deep tendon reflexes are 2+ in the knee and ankle, but there is 2 beats ankle clonus on the right.  ED Results / Procedures / Treatments   Labs (all labs ordered are listed, but only abnormal results are displayed) Labs Reviewed  CBC WITH DIFFERENTIAL/PLATELET  BASIC METABOLIC PANEL    Procedures Procedures    Medications Ordered in ED Medications - No data to display  ED Course/ Medical Decision Making/ A&P                           Medical Decision Making Amount and/or Complexity of Data Reviewed Labs: ordered.   Progressive weakness and difficulty walking.  I reviewed her past records and office visit on 04/30/2022 states cervical cord abnormality concerning for myelitis with intent to order CT of the cervical spine with contrast and labs including ESR, CRP, copper, ceruloplasmin, zinc, vitamin D, vitamin B12, folate, TSH, serum protein electrophoresis with IFE, angiotensin-converting enzyme, ANA/ENA panel.  Results reported in epic include normal folate, normal serum neoplasm, normal vitamin B12, normal vitamin D, normal protein electrophoresis, elevated sedimentation rate of 73, elevated total IgA which is polyclonal, normal TSH, normal angiotensin-converting enzyme.  As this is a confusing case, I have discussed the case with Dr. Cheral Marker of neurology service who recommends admitting the patient for evaluation of gait disturbance and plan on getting MRI of cervical spine with contrast.  I have ordered CBC and basic metabolic panel and will arrange for hospital admission once those results are back.  Case is signed out to Dr. Jeanell Sparrow, oncoming physician.  Final Clinical Impression(s) / ED Diagnoses Final diagnoses:  Gait disturbance    Rx / DC Orders ED Discharge Orders     None         Delora Fuel, MD 15[birth year]58 [phone]

## 2022-05-11 NOTE — Consult Note (Signed)
NEURO HOSPITALIST CONSULT NOTE   Requesting physician: Dr. Lorin Mercy  Reason for Consult: Possible demyelinating disease  History obtained from:  Patient and Chart     HPI:                                                                                                                                          Karla Howell is an 56 y.o. female with a PMHx of RA, IBS and HTN, currently being worked up by her outpatient Neurologist Dr. Posey Pronto for cervical myelopathy, who presented to [facility] yesterday morning with progressively worsening numbness in her legs which was making it difficult for her to walk.  She stated at that time that when something touched her legs it would feel like it has touched a bubble and not really touched her leg. This was described to the EDP at [facility] as having been gradually progressive over the last 5-6 months. She had recently been seen by Dr. Posey Pronto for evaluation given these symptoms and an MRI of the cervical spine with contrast had been scheduled for 12/16, but stated at [facility] that she was getting worse and did not think that she could wait that long.  She denied any bowel or bladder problems. She was then transferred to [facility] for expedited MRI cervical spine with contrast, inpatient Neurology evaluation and PT assessment given her increasing difficulty with ambulation.    At her new patient visit on 11/22 with Neurologist Dr. Posey Pronto to assess numbness/tingling. Per Dr. Serita Grit note, HPI as follows: "In July 2023, she was walking 1-2 miles and at the end of her walk, she began to have numbness and tingling involving the lower legs.  Symptoms lasted 5-10 minutes and self-resolved.  Her symptoms have become constant since October and she also has tightness in the legs.  She takes gabapentin 130m during the day as needed and 3063mat bedtime which provides minimal relief.  Right arm feels a little weaker.  No weakness in the legs. In September, she began  having right sided shoulder, neck, and arm pain. She was also having numbness into the right hand, especially the thumb and index finger.  She has tingling in all of the fingers. No numbness/tingling in the left hand. She has weakness in the right arm.  She is going to PT. She saw Dr. IbRon Ageet Mu[place]eloor these symptoms and specifically the right sided neck pain.  He ordered MRI cervical spine which showed T2 hyperintensity involving the cervical cord at C3-4 to C4-5. Subsequent imaging of the thoracic cord shows similar changes at T3 and T12. MRI brain was normal.  She is here for further evaluation. She denies cramps. Earlier in 2023, she had 4-5 spells of left arm and leg drawing up. She  works as an Passenger transport manager for [employer].  She lives alone.  No children."   Prior imaging studies:  MRI brain wwo contrast 2022/04/25:  Normal   MRI cervical spine wo contrast 04/02/2022:    Multiple areas of short-segment eccentric T2/FLAIR hyperintensity within the cord extending from C3-4 to C4-5.  Additional non expansile short segment T2/FLAIR hyperintensity within the right eccentric cord at C7.  Question possible subtle abnormal cord signal in the upper thoracic cord. Question cerebellar tonsillar ectopia.     MRI thoracic spine w/wo contrast 2022-04-25 Patchy T2 cord signal abnormality at the midline anteriorly at T3 and in th eleft aspect of the conus at T12 may reflect demyelinating lesions.  No abnormal enhancement to suggest active demyelination.   A repeat MRI c-spine with contrast to assess for possible acute enhancing lesions was then planned. The patient had not yet had this study when she presented to the ED. Dr. Posey Pronto had planned an LP following the MRI cervical spine. LP labs that were to be obtained wer CSF cell count and diff, protein, glucose, IgG index, oligoclonal bands, myelin basic protein, flow cytology, ACE. She had drawn an ANA panel, copper and zinc level.   On  further interview at the bedside this evening, the patient endorses having had several short spells of her left hand and foot turning inwards uncontrollably along with left leg weakness in January of this year, after which the spells never recurred. Later this year, in September, she was seen in the ED for a "crick in my neck" with associated right sided motor symptoms that then resolved. Again in October she had motor symptoms followed by another spell as described above of BLE symptoms in late November that precipitated her visit to Dr. Posey Pronto. She has never had any symptoms of loss of vision, eye pain or facial numbness/weakness in the past.   Past Medical History:  Diagnosis Date   Allergy    Anemia    history of    Essential hypertension    GERD (gastroesophageal reflux disease)    Heartburn    Hemorrhoids    IBS (irritable bowel syndrome)    Rheumatoid arthritis ([place])     Past Surgical History:  Procedure Laterality Date   BILATERAL SALPINGECTOMY Bilateral 01/25/2013   Procedure: BILATERAL SALPINGECTOMY;  Surgeon: Olga Millers, MD;  Location: [facility];  Service: Gynecology;  Laterality: Bilateral;   LAPAROSCOPIC ASSISTED VAGINAL HYSTERECTOMY N/A 01/25/2013   Procedure: LAPAROSCOPIC ASSISTED VAGINAL HYSTERECTOMY;  Surgeon: Olga Millers, MD;  Location: [facility];  Service: Gynecology;  Laterality: N/A;   thumb surgery Left     Family History  Problem Relation Age of Onset   Hypertension Mother    Hyperlipidemia Mother    Colon polyps Mother    Hypertension Sister    Heart disease Maternal Grandmother    Diabetes Maternal Grandmother    Heart disease Maternal Grandfather    Colon cancer Neg Hx    Esophageal cancer Neg Hx    Rectal cancer Neg Hx    Stomach cancer Neg Hx               Social History:  reports that she has never smoked. She has never used smokeless tobacco. She reports current alcohol use of about 7.0 standard drinks of alcohol per week. She reports that she does  not currently use drugs after having used the following drugs: Marijuana.  No Known Allergies  MEDICATIONS:  No current facility-administered medications on file prior to encounter.   Current Outpatient Medications on File Prior to Encounter  Medication Sig Dispense Refill   amLODipine (NORVASC) 2.5 MG tablet Take 1 tablet (2.5 mg total) by mouth daily. 90 tablet 3   azelastine (ASTELIN) 0.1 % nasal spray Place into both nostrils as needed.     azelastine (OPTIVAR) 0.05 % ophthalmic solution Place 1 drop into both eyes as needed.     celecoxib (CELEBREX) 200 MG capsule 1 CAPSULE WITH FOOD ORALLY TWICE A DAY AS NEEDED     gabapentin (NEURONTIN) 100 MG capsule Take one capsule daily.     gabapentin (NEURONTIN) 300 MG capsule Take one capsule at bedtime     HUMIRA PEN 40 MG/0.4ML PNKT Inject 1 mL into the muscle every 14 (fourteen) days.      methocarbamol (ROBAXIN) 500 MG tablet Take 1 tablet (500 mg total) by mouth every 8 (eight) hours as needed for muscle spasms. 30 tablet 0   vitamin C (ASCORBIC ACID) 500 MG tablet Take 500 mg by mouth daily. Reported on 10/02/2015     ZINC GLUCONATE PO Take by mouth.     zolpidem (AMBIEN) 5 MG tablet TAKE 1 TABLET (5 MG TOTAL) BY MOUTH EVERY DAY AT BEDTIME AS NEEDED FOR SLEEP 30 tablet 5    Scheduled:  [START ON 05/12/2022] amLODipine  2.5 mg Oral Daily   docusate sodium  100 mg Oral BID   [START ON 05/12/2022] gabapentin  100 mg Oral Daily   gabapentin  300 mg Oral QHS     ROS:                                                                                                                                       As per HPI. Does not endorse any additional symptoms.    Blood pressure 125/77, pulse 82, temperature 98.2 F (36.8 C), resp. rate 17, last menstrual period 01/06/2013, SpO2 99 %.   General Examination:                                                                                                        Physical Exam  HEENT-  [place]/AT    Lungs- Respirations unlabored Extremities- No edema   Neurological Examination Mental Status: Awake and alert, fully oriented. Thought content appropriate. Pleasant and cooperative. Speech fluent without evidence of aphasia.  Able to follow all commands without difficulty. Cranial Nerves: II: Temporal visual fields intact with  no extinction to DSS. PERRL  III,IV, VI: No ptosis. EOMI.  V: Temp sensation equal bilaterally  VII: Smile symmetric VIII: Hearing intact to voice IX,X: No hoarseness XI: Symmetric shoulder shrug XII: Midline tongue extension Motor: LUE 5/5 proximally and distally LLE 5/5 proximally and distally RUE 4/5 proximally and distally with bradykinesia RLE 3/5 HF, 4-/5 KF, 4+/5 knee extension, 4-/5 APF and ADF Sensory: Temp and light touch intact throughout to BUE and BLE proximally and distally, bilaterally. Dysesthesia to digits and palm of right hand. No extinction to DSS.  Deep Tendon Reflexes: 3+ bilateral brachioradialis and biceps. 4+ bilateral patellae, 2+ bilateral achilles. Positive Hoffman's bilaterally. Toes equivocal.  Cerebellar: No ataxia with FNF bilaterally, but slow on the right. No tremor at rest or with movement.   Gait: Deferred   Lab Results: Basic Metabolic Panel: Recent Labs  Lab 05/11/22 0558  NA 141  K 4.2  CL 105  CO2 28  GLUCOSE 97  BUN 14  CREATININE 1.02*  CALCIUM 9.1    CBC: Recent Labs  Lab 05/11/22 0558  WBC 4.4  NEUTROABS 2.0  HGB 12.1  HCT 37.8  MCV 89.8  PLT 275    Cardiac Enzymes: No results for input(s): "CKTOTAL", "CKMB", "CKMBINDEX", "TROPONINI" in the last 168 hours.  Lipid Panel: No results for input(s): "CHOL", "TRIG", "HDL", "CHOLHDL", "VLDL", "LDLCALC" in the last 168 hours.  Imaging: No results found.   Assessment: 56 year old female presenting with an 11 month history of recurrent  neurological symptoms in separate anatomical distributions. Imaging findings and history are most consistent with new onset multiple sclerosis with active exacerbation at the this time.  - Exam reveals right sided weakness, LUE bradykinesia, right hand dysesthesia and hyperreflexia.  - Imaging performed today: - MRI cervical spine w/wo contrast: Preliminary review of the images reveals enhancement of the cervical spinal cord lesion that was seen on the prior scan from 10/25. T2 hyperintense lesion at this location continues to be seen. Official report pending. - MRI thoracic spine w/wo contrast: Official report pending. Spinal cord T2 hyperintensities are noted. Will defer to Radiology regarding presence/absence of enhancement.  - MRI brain:  Several T2-hyperintense lesions are seen, including one in the left pons near the takeoff of the trigeminal nerve root. A few periventricular WM T2-hyperintense lesions are seen, as well as irregular T2-hyperintensity along the undersurface of the corpus callosum (septo-callosal margin). An enhancing WM lesion is seen in the left cerebral hemisphere. Official Radiology report pending and will defer to Radiology regarding possible additional enhancing lesions.  - Overall appearance of the imaging findings, in conjunction with her history of recurrent neurological symptoms in separate distributions, as well as objective deficits on today's exam, are consistent with lesions disseminated in time and space. The most likely etiology is felt to be multiple sclerosis based on the distributions, signal characteristics and morphologies of the brain and spinal cord lesions seen on MRI.  - Labs: - ESR 73 on 11/22 consistent with her history of RA. Recent CRP normal.  - Protein electrophoresis ordered 11/22 is normal.  - Recent vitamin B12, Vitamin E and ceruloplasmin levels normal.  - Recent ACE level normal. - TSH on 11/22 was normal.  - IgG and IgM levels from 11/22 were  normal. IgA level elevated at 441.  - ANA, copper and zinc are pending  Recommendations: - Discussed risks/benefits of high-dose Solumedrol x 5 days with the patient and her mother. The patient expressed understanding of the risks/benefits and  wishes to proceed with treatment. All questions answered.  - IV Solumedrol 1000 mg qd x 5 days has been ordered, first dose tonight.  - Monitor CBG as well as daily CBC and chem7 while on high-dose steroids.  - Can obtain LP with Dr. Posey Pronto as outpatient.  - Anti-MOG and anti NMO antibody titers have been ordered.    Electronically signed: Dr. Kerney Elbe 05/11/2022, 7:17 PM

## 2022-05-11 NOTE — ED Provider Notes (Signed)
  Physical Exam  BP (!) 143/90 (BP Location: Right Arm)   Pulse (!) 106   Temp 98.3 F (36.8 C) (Oral)   Resp 18   LMP 01/06/2013   SpO2 98%   Physical Exam  Procedures  Procedures  ED Course / MDM    Medical Decision Making Amount and/or Complexity of Data Reviewed Labs: ordered.  Risk Decision regarding hospitalization.   56 yo female   56 yo female with numbness arms and legs progressive over months. MRI cervical and thoracic spine with some patchy cervical cord abnormality concerning for myelitis 11/22 MRI cervical spine with contrast ordered by neurology-pending Now with increased difficulty walking Clonus on right D.W Dr. Cheral Marker who advised admission and they will consult re ongoing work up DW Dr. Lorin Mercy who will place admission orders   Pattricia Boss, MD 05/11/22 [phone]
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
## 2022-05-11 NOTE — Progress Notes (Signed)
Plan of Care Note for accepted transfer   Patient: Karla Howell MRN: [id number]   DOA: 05/11/2022  Facility requesting transfer: [facility] Requesting Provider: Jeanell Sparrow Reason for transfer: Myelitis  Facility course: Patient with h/o RA presenting with gait abnormality.  Weakness and numbness in arms/legs for months.  Followed as outpatient by neurology.  Abnormal MRI, concerning for myelitis, needs MRI with contrast.  More difficulty walking, myoclonus.  In addition to MRI, will need neurology.   Plan of care: The patient is accepted for admission to [place]  unit, at [facility].   Author: Karmen Bongo, MD 05/11/2022  Check [email or web address] for on-call coverage.  Nursing staff, Please call [place] number on Amion as soon as patient's arrival, so appropriate admitting provider can evaluate the pt.

## 2022-05-11 NOTE — ED Triage Notes (Signed)
Pt self ambulated to exam 11 with steady gait c/o bilateral leg swelling and numbness x 3 months. PT denies injury and has full ROM in all 4 extremities. Pt a/o x 4 skin warm dry intact. VSS NAD.

## 2022-05-11 NOTE — ED Notes (Signed)
Handoff report given to [facility] and to [employer] on 6N at [employer]

## 2022-05-11 NOTE — ED Notes (Signed)
Called [facility] and spoke to [place]; patient is ready for transport

## 2022-05-12 DIAGNOSIS — G35 Multiple sclerosis: Secondary | ICD-10-CM

## 2022-05-12 DIAGNOSIS — I10 Essential (primary) hypertension: Secondary | ICD-10-CM | POA: Diagnosis not present

## 2022-05-12 DIAGNOSIS — G0491 Myelitis, unspecified: Secondary | ICD-10-CM | POA: Diagnosis not present

## 2022-05-12 DIAGNOSIS — R269 Unspecified abnormalities of gait and mobility: Secondary | ICD-10-CM | POA: Diagnosis not present

## 2022-05-12 LAB — BASIC METABOLIC PANEL
Anion gap: 8 (ref 5–15)
BUN: 14 mg/dL (ref 6–20)
CO2: 24 mmol/L (ref 22–32)
Calcium: 8.9 mg/dL (ref 8.9–10.3)
Chloride: 106 mmol/L (ref 98–111)
Creatinine, Ser: 1.04 mg/dL — ABNORMAL HIGH (ref 0.44–1.00)
GFR, Estimated: >60 mL/min (ref >60)
Glucose, Bld: 96 mg/dL (ref 70–99)
Potassium: 4 mmol/L (ref 3.5–5.1)
Sodium: 138 mmol/L (ref 135–145)

## 2022-05-12 LAB — CBC
HCT: 37.2 % (ref 36.0–46.0)
Hemoglobin: 11.7 g/dL — ABNORMAL LOW (ref 12.0–15.0)
MCH: 28.8 pg (ref 26.0–34.0)
MCHC: 31.5 g/dL (ref 30.0–36.0)
MCV: 91.6 fL (ref 80.0–100.0)
Platelets: 263 10*3/uL (ref 150–400)
RBC: 4.06 MIL/uL (ref 3.87–5.11)
RDW: 12.4 % (ref 11.5–15.5)
WBC: 6.2 10*3/uL (ref 4.0–10.5)
nRBC: 0 % (ref 0.0–0.2)

## 2022-05-12 LAB — HEMOGLOBIN A1C
Hgb A1c MFr Bld: 5.3 % (ref 4.8–5.6)
Mean Plasma Glucose: 105 mg/dL

## 2022-05-12 LAB — ZINC: Zinc: 87 ug/dL (ref 44–115)

## 2022-05-12 MED ORDER — PANTOPRAZOLE SODIUM 40 MG PO TBEC
40.0000 mg | DELAYED_RELEASE_TABLET | Freq: Two times a day (BID) | ORAL | Status: DC
  Administered 2022-05-12 – 2022-05-16 (×9): 40 mg via ORAL
  Filled 2022-05-12 (×9): qty 1

## 2022-05-12 MED ORDER — ACETAMINOPHEN 500 MG PO TABS: 1000.0000 mg | ORAL_TABLET | ORAL | Status: DC | PRN

## 2022-05-12 NOTE — Progress Notes (Signed)
[facility] PROGRESS NOTE    Progress Note  Analisse Randle Hy  JKK:[id number] DOB: Jul 05, 1965 DOA: 05/11/2022 PCP: Marin Olp, MD     Brief Narrative:   Karla Howell is an 56 y.o. female past medical history significant for rheumatoid arthritis who is currently being worked up as an outpatient by Dr. Posey Pronto neurology for cervical myelopathy comes in gait instability and progressive weakness with inability to walk that started the day to prior to admission   Assessment/Plan:   Possible multiple sclerosis/  Myelitis ([place]) MRI cervical C-spine shows cervical spinal T2 hyperintensity. She was started empirically on IV Solu-Medrol pulse dose for 5 days. Monitor CBGs before meals and at bedtime. LP as an outpatient. Anti-Mogg and anti-NMO have been ordered. She was started on high-dose steroids she will probably become hyperglycemic.  Rheumatoid arthritis: On Humira every 2 weeks.  Essential hypertension: Well-controlled Norvasc continue current regimen.  DVT prophylaxis: lovenox Family Communication:none Status is: Inpatient Remains inpatient appropriate because: Possible MS flare    Code Status:     Code Status Orders  (From admission, onward)           Start     Ordered   05/11/22 1749  Full code  Continuous        05/11/22 1748           Code Status History     Date Active Date Inactive Code Status Order ID Comments User Context   01/25/2013 1130 01/26/2013 1220 Full Code [id number]  Olga Millers, MD Inpatient         IV Access:   Peripheral IV   Procedures and diagnostic studies:   MR BRAIN W WO CONTRAST  Result Date: 05/11/2022 CLINICAL DATA:  Difficulty walking EXAM: MRI HEAD WITHOUT AND WITH CONTRAST MRI CERVICAL SPINE WITHOUT AND WITH CONTRAST MRI CERVICAL THORACIC WITHOUT AND CONTRAST CONTRAST:  7.39m GADAVIST GADOBUTROL 1 MMOL/ML IV SOLN TECHNIQUE: Multiplanar, multiecho pulse sequences of the brain and  surrounding structures, and cervical and thoracic spine were obtained without and with intravenous contrast. COMPARISON:  04/11/2022 thoracic spine MRI 04/02/2022 cervical spine MRI 04/11/2022 brain MRI FINDINGS: MRI HEAD FINDINGS Brain: No acute infarct, mass effect or extra-axial collection. No acute or chronic hemorrhage. There are new hyperintense T2-weighted signal lesions within both occipital lobes. The midline structures are normal. There is a single new focus of parenchymal contrast enhancement in the posterior left parietal white matter (series 33, image 26). This is new since the most recent study. Vascular: Major flow voids are preserved. Skull and upper cervical spine: Normal calvarium and skull base. Visualized upper cervical spine and soft tissues are normal. Sinuses/Orbits:No paranasal sinus fluid levels or advanced mucosal thickening. No mastoid or middle ear effusion. Normal orbits. MRI CERVICAL SPINE FINDINGS Alignment: Physiologic. Vertebrae: No fracture, evidence of discitis, or bone lesion. Cord: Increased size of right hemicord hyperintense T2-weighted signal lesion centered at C4 but spanning the C3-5 levels. Lesion of the right dorsal cord at C7 is unchanged. There is mild contrast enhancement of the larger lesion most notably at the C5 level. paraspinal tissues: Negative Disc levels: No spinal canal stenosis. MRI THORACIC SPINE FINDINGS Alignment: Normal Vertebrae: No fracture, evidence of discitis, or bone lesion. Cord: There is a new lesion in the right hemicord at the T2-3 level with associated contrast enhancement. Paraspinal and other soft tissues: Negative Disc levels: No spinal canal stenosis IMPRESSION: 1. New hyperintense T2-weighted signal lesions within both occipital lobes and within the cervical  and thoracic spinal cord, consistent with demyelinating disease. CSF sampling should be considered. 2. New/worsening spinal cord lesions at C3-5 and T2-3 with associated contrast  enhancement. 3. Unchanged C7 lesion. Electronically Signed   By: Ulyses Jarred M.D.   On: 05/11/2022 19:46   MR CERVICAL SPINE W WO CONTRAST  Result Date: 05/11/2022 CLINICAL DATA:  Difficulty walking EXAM: MRI HEAD WITHOUT AND WITH CONTRAST MRI CERVICAL SPINE WITHOUT AND WITH CONTRAST MRI CERVICAL THORACIC WITHOUT AND CONTRAST CONTRAST:  7.70m GADAVIST GADOBUTROL 1 MMOL/ML IV SOLN TECHNIQUE: Multiplanar, multiecho pulse sequences of the brain and surrounding structures, and cervical and thoracic spine were obtained without and with intravenous contrast. COMPARISON:  04/11/2022 thoracic spine MRI 04/02/2022 cervical spine MRI 04/11/2022 brain MRI FINDINGS: MRI HEAD FINDINGS Brain: No acute infarct, mass effect or extra-axial collection. No acute or chronic hemorrhage. There are new hyperintense T2-weighted signal lesions within both occipital lobes. The midline structures are normal. There is a single new focus of parenchymal contrast enhancement in the posterior left parietal white matter (series 33, image 26). This is new since the most recent study. Vascular: Major flow voids are preserved. Skull and upper cervical spine: Normal calvarium and skull base. Visualized upper cervical spine and soft tissues are normal. Sinuses/Orbits:No paranasal sinus fluid levels or advanced mucosal thickening. No mastoid or middle ear effusion. Normal orbits. MRI CERVICAL SPINE FINDINGS Alignment: Physiologic. Vertebrae: No fracture, evidence of discitis, or bone lesion. Cord: Increased size of right hemicord hyperintense T2-weighted signal lesion centered at C4 but spanning the C3-5 levels. Lesion of the right dorsal cord at C7 is unchanged. There is mild contrast enhancement of the larger lesion most notably at the C5 level. paraspinal tissues: Negative Disc levels: No spinal canal stenosis. MRI THORACIC SPINE FINDINGS Alignment: Normal Vertebrae: No fracture, evidence of discitis, or bone lesion. Cord: There is a new lesion  in the right hemicord at the T2-3 level with associated contrast enhancement. Paraspinal and other soft tissues: Negative Disc levels: No spinal canal stenosis IMPRESSION: 1. New hyperintense T2-weighted signal lesions within both occipital lobes and within the cervical and thoracic spinal cord, consistent with demyelinating disease. CSF sampling should be considered. 2. New/worsening spinal cord lesions at C3-5 and T2-3 with associated contrast enhancement. 3. Unchanged C7 lesion. Electronically Signed   By: KUlyses JarredM.D.   On: 05/11/2022 19:46   MR THORACIC SPINE W WO CONTRAST  Result Date: 05/11/2022 CLINICAL DATA:  Difficulty walking EXAM: MRI HEAD WITHOUT AND WITH CONTRAST MRI CERVICAL SPINE WITHOUT AND WITH CONTRAST MRI CERVICAL THORACIC WITHOUT AND CONTRAST CONTRAST:  7.749mGADAVIST GADOBUTROL 1 MMOL/ML IV SOLN TECHNIQUE: Multiplanar, multiecho pulse sequences of the brain and surrounding structures, and cervical and thoracic spine were obtained without and with intravenous contrast. COMPARISON:  04/11/2022 thoracic spine MRI 04/02/2022 cervical spine MRI 04/11/2022 brain MRI FINDINGS: MRI HEAD FINDINGS Brain: No acute infarct, mass effect or extra-axial collection. No acute or chronic hemorrhage. There are new hyperintense T2-weighted signal lesions within both occipital lobes. The midline structures are normal. There is a single new focus of parenchymal contrast enhancement in the posterior left parietal white matter (series 33, image 26). This is new since the most recent study. Vascular: Major flow voids are preserved. Skull and upper cervical spine: Normal calvarium and skull base. Visualized upper cervical spine and soft tissues are normal. Sinuses/Orbits:No paranasal sinus fluid levels or advanced mucosal thickening. No mastoid or middle ear effusion. Normal orbits. MRI CERVICAL SPINE FINDINGS Alignment: Physiologic. Vertebrae: No fracture,  evidence of discitis, or bone lesion. Cord: Increased  size of right hemicord hyperintense T2-weighted signal lesion centered at C4 but spanning the C3-5 levels. Lesion of the right dorsal cord at C7 is unchanged. There is mild contrast enhancement of the larger lesion most notably at the C5 level. paraspinal tissues: Negative Disc levels: No spinal canal stenosis. MRI THORACIC SPINE FINDINGS Alignment: Normal Vertebrae: No fracture, evidence of discitis, or bone lesion. Cord: There is a new lesion in the right hemicord at the T2-3 level with associated contrast enhancement. Paraspinal and other soft tissues: Negative Disc levels: No spinal canal stenosis IMPRESSION: 1. New hyperintense T2-weighted signal lesions within both occipital lobes and within the cervical and thoracic spinal cord, consistent with demyelinating disease. CSF sampling should be considered. 2. New/worsening spinal cord lesions at C3-5 and T2-3 with associated contrast enhancement. 3. Unchanged C7 lesion. Electronically Signed   By: Ulyses Jarred M.D.   On: 05/11/2022 19:46     Medical Consultants:   None.   Subjective:    Pauline Aus Brendel no complaints just a little bit anxious.  Objective:    Vitals:   05/11/22 1633 05/11/22 2140 05/12/22 0056 05/12/22 0655  BP: 125/77 (!) 134/94 125/86 138/89  Pulse: 82 86 68 79  Resp: '17 16 18 18  '$ Temp: 98.2 F (36.8 C) 98.5 F (36.9 C) 98.1 F (36.7 C) 97.7 F (36.5 C)  TempSrc:  Oral Oral Oral  SpO2: 99% 99% 99% 97%   SpO2: 97 %  No intake or output data in the 24 hours ending 05/12/22 0731 There were no vitals filed for this visit.  Exam: General exam: In no acute distress. Respiratory system: Good air movement and clear to auscultation. Cardiovascular system: S1 & S2 heard, RRR. No JVD. Gastrointestinal system: Abdomen is nondistended, soft and nontender.  Extremities: No pedal edema. Skin: No rashes, lesions or ulcers Psychiatry: Judgement and insight appear normal. Mood & affect appropriate.    Data  Reviewed:    Labs: Basic Metabolic Panel: Recent Labs  Lab 05/11/22 0558 05/12/22 0317  NA 141 138  K 4.2 4.0  CL 105 106  CO2 28 24  GLUCOSE 97 96  BUN 14 14  CREATININE 1.02* 1.04*  CALCIUM 9.1 8.9   GFR Estimated Creatinine Clearance: 60 mL/min (A) (by C-G formula based on SCr of 1.04 mg/dL (H)). Liver Function Tests: No results for input(s): "AST", "ALT", "ALKPHOS", "BILITOT", "PROT", "ALBUMIN" in the last 168 hours. No results for input(s): "LIPASE", "AMYLASE" in the last 168 hours. No results for input(s): "AMMONIA" in the last 168 hours. Coagulation profile No results for input(s): "INR", "PROTIME" in the last 168 hours. COVID-19 Labs  Recent Labs    05/11/22 1949  CRP 0.6    Lab Results  Component Value Date   SARSCOV2NAA Not Detected 07/26/2019   [place] Not Detected 12/22/2018    CBC: Recent Labs  Lab 05/11/22 0558 05/12/22 0317  WBC 4.4 6.2  NEUTROABS 2.0  --   HGB 12.1 11.7*  HCT 37.8 37.2  MCV 89.8 91.6  PLT 275 263   Cardiac Enzymes: No results for input(s): "CKTOTAL", "CKMB", "CKMBINDEX", "TROPONINI" in the last 168 hours. BNP (last 3 results) No results for input(s): "PROBNP" in the last 8760 hours. CBG: No results for input(s): "GLUCAP" in the last 168 hours. D-Dimer: No results for input(s): "DDIMER" in the last 72 hours. Hgb A1c: No results for input(s): "HGBA1C" in the last 72 hours. Lipid Profile: No results for  input(s): "CHOL", "HDL", "LDLCALC", "TRIG", "CHOLHDL", "LDLDIRECT" in the last 72 hours. Thyroid function studies: No results for input(s): "TSH", "T4TOTAL", "T3FREE", "THYROIDAB" in the last 72 hours.  Invalid input(s): "FREET3" Anemia work up: No results for input(s): "VITAMINB12", "FOLATE", "FERRITIN", "TIBC", "IRON", "RETICCTPCT" in the last 72 hours. Sepsis Labs: Recent Labs  Lab 05/11/22 0558 05/12/22 0317  WBC 4.4 6.2   Microbiology No results found for this or any previous visit (from the past 240  hour(s)).   Medications:    amLODipine  2.5 mg Oral Daily   docusate sodium  100 mg Oral BID   gabapentin  100 mg Oral Daily   gabapentin  300 mg Oral QHS   Continuous Infusions:  methylPREDNISolone (SOLU-MEDROL) injection 1,000 mg (05/12/22 0019)      LOS: 1 day   Charlynne Cousins  [facility]  05/12/2022, 7:31 AM

## 2022-05-12 NOTE — Progress Notes (Signed)
Mobility Specialist - Progress Note   05/12/22 1024  Mobility  Activity Ambulated with assistance in hallway  Level of Assistance Contact guard assist, steadying assist  Assistive Device Other (Comment) (IV Pole)  Distance Ambulated (ft) 200 ft  Activity Response Tolerated well  Mobility Referral Yes  $Mobility charge 1 Mobility    Pt received sitting EOB agreeable to mobility. C/o history of RLE buckling and weakness in RUE. Experienced slight unsteadiness when demonstrating ambulation without IV pole. Left sitting EOB w/ call bell in reach and all needs met.    [place] Specialist Please contact via SecureChat or Rehab office at [phone]

## 2022-05-12 NOTE — TOC CM/SW Note (Addendum)
Consult for home health and DME needs, will need PT evaluation . Waiting to see how patient responds to solumedrol , then attending will have PT evaluate patient .

## 2022-05-12 NOTE — Progress Notes (Signed)
Neurology Progress Note  Brief HPI: 56 year old patient with history of rheumatoid arthritis, IBS, hypertension who is undergoing work-up for cervical myelopathy presented with worsening weakness is in her right leg as well as numbness in her legs.  Patient states that it feels like there is a bubble around her legs and she has a sensory deficit in bilateral legs, worse on the right as well as weakness of the right leg, symptoms have been worsening over the last 5 to 6 months. MRI of brain and cervical spine revealed T2  hyperintensities in the in both occipital lobes, as well as cervical and thoracic spinal cord consistent with demyelinating disease.  Subjective: Patient reports that her symptoms are stable, and she is eager to work with physical therapy today.  She has tolerated her first dose of Solu-Medrol well and reports no side effects except for possible insomnia.  Exam: Vitals:   05/12/22 0056 05/12/22 0655  BP: 125/86 138/89  Pulse: 68 79  Resp: 18 18  Temp: 98.1 F (36.7 C) 97.7 F (36.5 C)  SpO2: 99% 97%   Gen: In bed, NAD Resp: non-labored breathing, no acute distress  Neuro: Mental Status: Alert and oriented x3 Cranial Nerves: Pupils equal round and reactive, extraocular movements intact, face symmetrical, facial sensation symmetrical, phonation normal, hearing intact to voice, shoulder shrug symmetrical, tongue midline Motor: Bilateral upper extremities 5 out of 5 proximally and distally, left lower extremity hip flexion 4+ out of 5, knee extension and knee flexion 5 out of 5, plantarflexion and dorsiflexion 5 out of 5, right lower extremity hip flexion 4- out of 5, knee flexion and knee extension 4+ out of 5, plantarflexion and dorsiflexion 5 out of 5 Sensory: Intact to light touch throughout with slight sensory deficit on the right leg DTR: 2+ throughout Gait: Deferred  Pertinent Labs:    Latest Ref Rng & Units 05/12/2022    3:17 AM 05/11/2022    5:58 AM 07/12/2021     8:01 AM  CBC  WBC 4.0 - 10.5 K/uL 6.2  4.4  5.0   Hemoglobin 12.0 - 15.0 g/dL 11.7  12.1  12.2   Hematocrit 36.0 - 46.0 % 37.2  37.8  37.7   Platelets 150 - 400 K/uL 263  275  305.0        Latest Ref Rng & Units 05/12/2022    3:17 AM 05/11/2022    5:58 AM 07/12/2021    8:01 AM  BMP  Glucose 70 - 99 mg/dL 96  97  103   BUN 6 - 20 mg/dL '14  14  14   '$ Creatinine 0.44 - 1.00 mg/dL 1.04  1.02  1.03   Sodium 135 - 145 mmol/L 138  141  138   Potassium 3.5 - 5.1 mmol/L 4.0  4.2  3.7   Chloride 98 - 111 mmol/L 106  105  102   CO2 22 - 32 mmol/L 24  28  32   Calcium 8.9 - 10.3 mg/dL 8.9  9.1  9.3    ACE 59 CRP 0.6 B12 902 Ceruloplasmin 28.4 Zinc 87 Imaging Reviewed:  MRI brain, cervical and thoracic spine with contrast: No acute infarct, single focus of contrast-enhancement enhancement in posterior left parietal white matter, hyperintense T2 signal lesion at C4, lesion of right dorsal cord at C7 unchanged since last scan, new lesion in right hemicord at T2-3 level with associated contrast-enhancement  Assessment: 56 year old patient with history of rheumatoid arthritis, IBS and hypertension presents with worsening right lower  extremity weakness and bilateral lower extremity sensory deficits.  MRI is consistent with demyelinating disease, leading to a diagnosis of new onset MS.  Will treat with high-dose Solu-Medrol for 5 days, and patient appears to be tolerating this well after her first dose.  LP not needed, as MRI was diagnostic.  Impression: New onset MS with right leg weakness  Recommendations: 1) continue Solu-Medrol 1 g IV daily for 5 days 2) PT/OT 3) patient will need outpatient follow-up with neurologist for maintenance medication  [place] , MSN, AGACNP-BC [facility] See Amion for schedule and pager information 05/12/2022 10:21 AM

## 2022-05-13 DIAGNOSIS — R269 Unspecified abnormalities of gait and mobility: Secondary | ICD-10-CM | POA: Diagnosis not present

## 2022-05-13 DIAGNOSIS — I10 Essential (primary) hypertension: Secondary | ICD-10-CM | POA: Diagnosis not present

## 2022-05-13 DIAGNOSIS — G0491 Myelitis, unspecified: Secondary | ICD-10-CM | POA: Diagnosis not present

## 2022-05-13 DIAGNOSIS — G35 Multiple sclerosis: Secondary | ICD-10-CM | POA: Diagnosis not present

## 2022-05-13 LAB — ANTIEXTRACTABLE NUCLEAR AG
ENA SM Ab Ser-aCnc: <0.2 AI (ref 0.0–0.9)
Ribonucleic Protein: 0.2 AI (ref 0.0–0.9)

## 2022-05-13 LAB — ANA W/REFLEX IF POSITIVE: Anti Nuclear Antibody (ANA): NEGATIVE

## 2022-05-13 LAB — SJOGRENS SYNDROME-A EXTRACTABLE NUCLEAR ANTIBODY: SSA (Ro) (ENA) Antibody, IgG: <0.2 AI (ref 0.0–0.9)

## 2022-05-13 LAB — ANTI-DNA ANTIBODY, DOUBLE-STRANDED: ds DNA Ab: <1 IU/mL (ref 0–9)

## 2022-05-13 LAB — SJOGRENS SYNDROME-B EXTRACTABLE NUCLEAR ANTIBODY: SSB (La) (ENA) Antibody, IgG: <0.2 AI (ref 0.0–0.9)

## 2022-05-13 LAB — ANTI-SCLERODERMA ANTIBODY: Scleroderma (Scl-70) (ENA) Antibody, IgG: <0.2 AI (ref 0.0–0.9)

## 2022-05-13 NOTE — Progress Notes (Signed)
[facility] PROGRESS NOTE    Progress Note  Karla Howell  BDZ:[id number] DOB: 1965/08/24 DOA: 05/11/2022 PCP: Marin Olp, MD     Brief Narrative:   Karla Howell is an 56 y.o. female past medical history significant for rheumatoid arthritis who is currently being worked up as an outpatient by Dr. Posey Pronto neurology for cervical myelopathy comes in gait instability and progressive weakness with inability to walk that started the day to prior to admission   Assessment/Plan:   Possible multiple sclerosis/  Myelitis ([place]) MRI cervical C-spine shows cervical spinal T2 hyperintensity. She was started empirically on IV Solu-Medrol pulse dose for 5 days. Monitor CBGs before meals and at bedtime. Anti-Mogg and anti-NMO have been ordered. Continue to monitor blood glucose No major side effects.  Rheumatoid arthritis: On Humira every 2 weeks.  Essential hypertension: Well-controlled Norvasc continue current regimen.  DVT prophylaxis: lovenox Family Communication:none Status is: Inpatient Remains inpatient appropriate because: Possible MS flare    Code Status:     Code Status Orders  (From admission, onward)           Start     Ordered   05/11/22 1749  Full code  Continuous        05/11/22 1748           Code Status History     Date Active Date Inactive Code Status Order ID Comments User Context   01/25/2013 1130 01/26/2013 1220 Full Code [id number]  Olga Millers, MD Inpatient         IV Access:   Peripheral IV   Procedures and diagnostic studies:   MR BRAIN W WO CONTRAST  Result Date: 05/11/2022 CLINICAL DATA:  Difficulty walking EXAM: MRI HEAD WITHOUT AND WITH CONTRAST MRI CERVICAL SPINE WITHOUT AND WITH CONTRAST MRI CERVICAL THORACIC WITHOUT AND CONTRAST CONTRAST:  7.3m GADAVIST GADOBUTROL 1 MMOL/ML IV SOLN TECHNIQUE: Multiplanar, multiecho pulse sequences of the brain and surrounding structures, and cervical and  thoracic spine were obtained without and with intravenous contrast. COMPARISON:  04/11/2022 thoracic spine MRI 04/02/2022 cervical spine MRI 04/11/2022 brain MRI FINDINGS: MRI HEAD FINDINGS Brain: No acute infarct, mass effect or extra-axial collection. No acute or chronic hemorrhage. There are new hyperintense T2-weighted signal lesions within both occipital lobes. The midline structures are normal. There is a single new focus of parenchymal contrast enhancement in the posterior left parietal white matter (series 33, image 26). This is new since the most recent study. Vascular: Major flow voids are preserved. Skull and upper cervical spine: Normal calvarium and skull base. Visualized upper cervical spine and soft tissues are normal. Sinuses/Orbits:No paranasal sinus fluid levels or advanced mucosal thickening. No mastoid or middle ear effusion. Normal orbits. MRI CERVICAL SPINE FINDINGS Alignment: Physiologic. Vertebrae: No fracture, evidence of discitis, or bone lesion. Cord: Increased size of right hemicord hyperintense T2-weighted signal lesion centered at C4 but spanning the C3-5 levels. Lesion of the right dorsal cord at C7 is unchanged. There is mild contrast enhancement of the larger lesion most notably at the C5 level. paraspinal tissues: Negative Disc levels: No spinal canal stenosis. MRI THORACIC SPINE FINDINGS Alignment: Normal Vertebrae: No fracture, evidence of discitis, or bone lesion. Cord: There is a new lesion in the right hemicord at the T2-3 level with associated contrast enhancement. Paraspinal and other soft tissues: Negative Disc levels: No spinal canal stenosis IMPRESSION: 1. New hyperintense T2-weighted signal lesions within both occipital lobes and within the cervical and thoracic spinal cord, consistent with  demyelinating disease. CSF sampling should be considered. 2. New/worsening spinal cord lesions at C3-5 and T2-3 with associated contrast enhancement. 3. Unchanged C7 lesion.  Electronically Signed   By: Ulyses Jarred M.D.   On: 05/11/2022 19:46   MR CERVICAL SPINE W WO CONTRAST  Result Date: 05/11/2022 CLINICAL DATA:  Difficulty walking EXAM: MRI HEAD WITHOUT AND WITH CONTRAST MRI CERVICAL SPINE WITHOUT AND WITH CONTRAST MRI CERVICAL THORACIC WITHOUT AND CONTRAST CONTRAST:  7.55m GADAVIST GADOBUTROL 1 MMOL/ML IV SOLN TECHNIQUE: Multiplanar, multiecho pulse sequences of the brain and surrounding structures, and cervical and thoracic spine were obtained without and with intravenous contrast. COMPARISON:  04/11/2022 thoracic spine MRI 04/02/2022 cervical spine MRI 04/11/2022 brain MRI FINDINGS: MRI HEAD FINDINGS Brain: No acute infarct, mass effect or extra-axial collection. No acute or chronic hemorrhage. There are new hyperintense T2-weighted signal lesions within both occipital lobes. The midline structures are normal. There is a single new focus of parenchymal contrast enhancement in the posterior left parietal white matter (series 33, image 26). This is new since the most recent study. Vascular: Major flow voids are preserved. Skull and upper cervical spine: Normal calvarium and skull base. Visualized upper cervical spine and soft tissues are normal. Sinuses/Orbits:No paranasal sinus fluid levels or advanced mucosal thickening. No mastoid or middle ear effusion. Normal orbits. MRI CERVICAL SPINE FINDINGS Alignment: Physiologic. Vertebrae: No fracture, evidence of discitis, or bone lesion. Cord: Increased size of right hemicord hyperintense T2-weighted signal lesion centered at C4 but spanning the C3-5 levels. Lesion of the right dorsal cord at C7 is unchanged. There is mild contrast enhancement of the larger lesion most notably at the C5 level. paraspinal tissues: Negative Disc levels: No spinal canal stenosis. MRI THORACIC SPINE FINDINGS Alignment: Normal Vertebrae: No fracture, evidence of discitis, or bone lesion. Cord: There is a new lesion in the right hemicord at the T2-3  level with associated contrast enhancement. Paraspinal and other soft tissues: Negative Disc levels: No spinal canal stenosis IMPRESSION: 1. New hyperintense T2-weighted signal lesions within both occipital lobes and within the cervical and thoracic spinal cord, consistent with demyelinating disease. CSF sampling should be considered. 2. New/worsening spinal cord lesions at C3-5 and T2-3 with associated contrast enhancement. 3. Unchanged C7 lesion. Electronically Signed   By: KUlyses JarredM.D.   On: 05/11/2022 19:46   MR THORACIC SPINE W WO CONTRAST  Result Date: 05/11/2022 CLINICAL DATA:  Difficulty walking EXAM: MRI HEAD WITHOUT AND WITH CONTRAST MRI CERVICAL SPINE WITHOUT AND WITH CONTRAST MRI CERVICAL THORACIC WITHOUT AND CONTRAST CONTRAST:  7.76mGADAVIST GADOBUTROL 1 MMOL/ML IV SOLN TECHNIQUE: Multiplanar, multiecho pulse sequences of the brain and surrounding structures, and cervical and thoracic spine were obtained without and with intravenous contrast. COMPARISON:  04/11/2022 thoracic spine MRI 04/02/2022 cervical spine MRI 04/11/2022 brain MRI FINDINGS: MRI HEAD FINDINGS Brain: No acute infarct, mass effect or extra-axial collection. No acute or chronic hemorrhage. There are new hyperintense T2-weighted signal lesions within both occipital lobes. The midline structures are normal. There is a single new focus of parenchymal contrast enhancement in the posterior left parietal white matter (series 33, image 26). This is new since the most recent study. Vascular: Major flow voids are preserved. Skull and upper cervical spine: Normal calvarium and skull base. Visualized upper cervical spine and soft tissues are normal. Sinuses/Orbits:No paranasal sinus fluid levels or advanced mucosal thickening. No mastoid or middle ear effusion. Normal orbits. MRI CERVICAL SPINE FINDINGS Alignment: Physiologic. Vertebrae: No fracture, evidence of discitis, or bone lesion.  Cord: Increased size of right hemicord  hyperintense T2-weighted signal lesion centered at C4 but spanning the C3-5 levels. Lesion of the right dorsal cord at C7 is unchanged. There is mild contrast enhancement of the larger lesion most notably at the C5 level. paraspinal tissues: Negative Disc levels: No spinal canal stenosis. MRI THORACIC SPINE FINDINGS Alignment: Normal Vertebrae: No fracture, evidence of discitis, or bone lesion. Cord: There is a new lesion in the right hemicord at the T2-3 level with associated contrast enhancement. Paraspinal and other soft tissues: Negative Disc levels: No spinal canal stenosis IMPRESSION: 1. New hyperintense T2-weighted signal lesions within both occipital lobes and within the cervical and thoracic spinal cord, consistent with demyelinating disease. CSF sampling should be considered. 2. New/worsening spinal cord lesions at C3-5 and T2-3 with associated contrast enhancement. 3. Unchanged C7 lesion. Electronically Signed   By: Ulyses Jarred M.D.   On: 05/11/2022 19:46     Medical Consultants:   None.   Subjective:    Karla Howell no complaints  Objective:    Vitals:   05/12/22 0655 05/12/22 2121 05/13/22 0555 05/13/22 0752  BP: 138/89 117/68 129/77 (!) 130/91  Pulse: 79 97 (!) 101 60  Resp: '18 18 16 16  '$ Temp: 97.7 F (36.5 C) (!) 97.5 F (36.4 C) 98 F (36.7 C) 97.8 F (36.6 C)  TempSrc: Oral Oral Oral Oral  SpO2: 97% 97% 97%    SpO2: 97 %   Intake/Output Summary (Last 24 hours) at 05/13/2022 4270 Last data filed at 05/13/2022 0600 Gross per 24 hour  Intake 240 ml  Output --  Net 240 ml   There were no vitals filed for this visit.  Exam: General exam: In no acute distress. Respiratory system: Good air movement and clear to auscultation. Cardiovascular system: S1 & S2 heard, RRR. No JVD. Gastrointestinal system: Abdomen is nondistended, soft and nontender.  Extremities: No pedal edema. Skin: No rashes, lesions or ulcers Psychiatry: Judgement and insight appear  normal. Mood & affect appropriate.  Data Reviewed:    Labs: Basic Metabolic Panel: Recent Labs  Lab 05/11/22 0558 05/12/22 0317  NA 141 138  K 4.2 4.0  CL 105 106  CO2 28 24  GLUCOSE 97 96  BUN 14 14  CREATININE 1.02* 1.04*  CALCIUM 9.1 8.9    GFR Estimated Creatinine Clearance: 60 mL/min (A) (by C-G formula based on SCr of 1.04 mg/dL (H)). Liver Function Tests: No results for input(s): "AST", "ALT", "ALKPHOS", "BILITOT", "PROT", "ALBUMIN" in the last 168 hours. No results for input(s): "LIPASE", "AMYLASE" in the last 168 hours. No results for input(s): "AMMONIA" in the last 168 hours. Coagulation profile No results for input(s): "INR", "PROTIME" in the last 168 hours. COVID-19 Labs  Recent Labs    05/11/22 1949  CRP 0.6     Lab Results  Component Value Date   SARSCOV2NAA Not Detected 07/26/2019   [place] Not Detected 12/22/2018    CBC: Recent Labs  Lab 05/11/22 0558 05/12/22 0317  WBC 4.4 6.2  NEUTROABS 2.0  --   HGB 12.1 11.7*  HCT 37.8 37.2  MCV 89.8 91.6  PLT 275 263    Cardiac Enzymes: No results for input(s): "CKTOTAL", "CKMB", "CKMBINDEX", "TROPONINI" in the last 168 hours. BNP (last 3 results) No results for input(s): "PROBNP" in the last 8760 hours. CBG: No results for input(s): "GLUCAP" in the last 168 hours. D-Dimer: No results for input(s): "DDIMER" in the last 72 hours. Hgb A1c: Recent Labs  05/12/22 0317  HGBA1C 5.3   Lipid Profile: No results for input(s): "CHOL", "HDL", "LDLCALC", "TRIG", "CHOLHDL", "LDLDIRECT" in the last 72 hours. Thyroid function studies: No results for input(s): "TSH", "T4TOTAL", "T3FREE", "THYROIDAB" in the last 72 hours.  Invalid input(s): "FREET3" Anemia work up: No results for input(s): "VITAMINB12", "FOLATE", "FERRITIN", "TIBC", "IRON", "RETICCTPCT" in the last 72 hours. Sepsis Labs: Recent Labs  Lab 05/11/22 0558 05/12/22 0317  WBC 4.4 6.2    Microbiology No results found for this  or any previous visit (from the past 240 hour(s)).   Medications:    amLODipine  2.5 mg Oral Daily   docusate sodium  100 mg Oral BID   gabapentin  100 mg Oral Daily   gabapentin  300 mg Oral QHS   pantoprazole  40 mg Oral BID   Continuous Infusions:  methylPREDNISolone (SOLU-MEDROL) injection 1,000 mg (05/12/22 1209)      LOS: 2 days   Charlynne Cousins  [facility]  05/13/2022, 9:52 AM

## 2022-05-13 NOTE — Progress Notes (Signed)
Mobility Specialist - Progress Note   05/13/22 1500  Mobility  Activity Ambulated with assistance in hallway  Level of Assistance Contact guard assist, steadying assist  Assistive Device Front wheel walker  Distance Ambulated (ft) 500 ft  Activity Response Tolerated well  $Mobility charge 1 Mobility    Pt received in recliner agreeable to mobility. No complaints throughout, tolerated increased distance well. Left in recliner w/ call bell in reach and all needs met.   [place] Specialist Please contact via SecureChat or Rehab office at [phone]

## 2022-05-13 NOTE — Evaluation (Addendum)
Physical Therapy Evaluation Patient Details Name: Karla Howell MRN: [id number] DOB: 09/26/1965 Today's Date: 05/13/2022  History of Present Illness  Patient is 56 year old female presents with worsening right lower extremity weakness and bilateral lower extremity sensory deficits.  MRI is consistent with demyelinating disease, leading to a diagnosis of new onset MS. PMH significant of rheumatoid arthritis, IBS, hypertension who is undergoing work-up for cervical myelopathy.   Clinical Impression  Colin Norment is 56 y.o. female admitted with above HPI and diagnosis. Patient is currently limited by functional impairments below (see PT problem list). Patient lives alone and is independent at baseline. Patient works from home for [employer] and does all ADL's, drives, and mobilizes with no assist. She denies falls but is having difficulty mobilizing. Pt requires UE support of SPC or RW for improved balance with gait. Rt LE is notably weaker than Lt and pt's sensation is slightly reduced to light touch on Rt. Patient will benefit from continued skilled PT interventions to address impairments and progress independence with mobility, recommending OPPT for neuro based rehab. Patient requesting handouts on MS and this therapist provided handout form the [facility]. Acute PT will follow and progress as able.        Recommendations for follow up therapy are one component of a multi-disciplinary discharge planning process, led by the attending physician.  Recommendations may be updated based on patient status, additional functional criteria and insurance authorization.  Follow Up Recommendations Outpatient PT (Neuro Rehab)      Assistance Recommended at Discharge Intermittent Supervision/Assistance  Patient can return home with the following  A little help with walking and/or transfers;A little help with bathing/dressing/bathroom;Assistance with cooking/housework;Direct  supervision/assist for medications management;Assist for transportation;Help with stairs or ramp for entrance    Equipment Recommendations Rolling walker (2 wheels)  Recommendations for Other Services  OT consult    Functional Status Assessment Patient has had a recent decline in their functional status and demonstrates the ability to make significant improvements in function in a reasonable and predictable amount of time.     Precautions / Restrictions Precautions Precaution Comments: decnies falls in last 6 months Restrictions Weight Bearing Restrictions: No      Mobility  Bed Mobility               General bed mobility comments: sitting EOB    Transfers Overall transfer level: Needs assistance Equipment used: None Transfers: Sit to/from Stand Sit to Stand: Modified independent (Device/Increase time)           General transfer comment: use of hands for sit<>stand, no cues or assist    Ambulation/Gait Ambulation/Gait assistance: Min guard, Min assist Gait Distance (Feet): 350 Feet Assistive device: IV Pole, None, Rolling walker (2 wheels), Straight cane, Quad cane Gait Pattern/deviations: Step-through pattern, Decreased stride length, Decreased dorsiflexion - right, Knee hyperextension - right Gait velocity: decr to fair     General Gait Details: gait completed with various AD. Pt with decreased stability on Rt LE with slightly reduced dorsiflexion on Rt and decreased quad stability with some hyperextension in stance phase. Pt required min assist with no AD to steady balance and 2 bouts of LOB. Pt had improved stability with single UE support of IV pole and SPC, quad cane too cumbersone for pt. RW provided greatest stability and pt's gait much smoother with no LOB or drift Rt/Lt. Encouraged pt to use RW during stay and progress to [facility] with OPPT. SPC second best device for pt's balance  with gait.  Stairs            Wheelchair Mobility    Modified Rankin  (Stroke Patients Only)       Balance Overall balance assessment: Needs assistance, Mild deficits observed, not formally tested Sitting-balance support: Feet supported, No upper extremity supported Sitting balance-Leahy Scale: Normal     Standing balance support: Single extremity supported, Bilateral upper extremity supported, No upper extremity supported, During functional activity Standing balance-Leahy Scale: Fair                               Pertinent Vitals/Pain      Home Living Family/patient expects to be discharged to:: Private residence Living Arrangements: Alone Available Help at Discharge: Family Type of Home: House Home Access: Stairs to enter Entrance Stairs-Rails: None Entrance Stairs-Number of Steps: 3+1 Alternate Level Stairs-Number of Steps: 14-17 (has a landing) Home Layout: Two level Home Equipment: None;Shower seat - built in;Grab bars - tub/shower      Prior Function Prior Level of Function : Working/employed;Driving;Independent/Modified Independent             Mobility Comments: works from home, [employer].. ADLs Comments: independent     Hand Dominance        Extremity/Trunk Assessment   Upper Extremity Assessment Upper Extremity Assessment: Defer to OT evaluation    Lower Extremity Assessment Lower Extremity Assessment: LLE deficits/detail;RLE deficits/detail RLE Deficits / Details: MMT: DF= 3-; hip flex= 3; quad= 4-; hamstring= 3+;  Clonus testing at ankle: 4 beats dorsiflexion RLE Sensation: decreased light touch (faint feeling) RLE Coordination:  (limited heel to toe due to weakness in Rt LE) LLE Deficits / Details: MMT: DF= 4+; hip flex= 4-; quad= 4+; hamstring= 4+; Clonus testing at ankle: 3 beats dorsiflexion LLE Sensation: WNL LLE Coordination: WNL       Communication   Communication: No difficulties  Cognition Arousal/Alertness: Awake/alert Behavior During Therapy: WFL for tasks  assessed/performed Overall Cognitive Status: Within Functional Limits for tasks assessed                                          General Comments      Exercises     Assessment/Plan    PT Assessment Patient needs continued PT services  PT Problem List Decreased strength;Decreased range of motion;Decreased activity tolerance;Decreased balance;Decreased mobility;Decreased coordination;Decreased safety awareness;Decreased knowledge of precautions;Impaired sensation       PT Treatment Interventions DME instruction;Gait training;Stair training;Functional mobility training;Therapeutic activities;Therapeutic exercise;Balance training;Neuromuscular re-education;Patient/family education    PT Goals (Current goals can be found in the Care Plan section)  Acute Rehab PT Goals Patient Stated Goal: regain independence PT Goal Formulation: With patient Time For Goal Achievement: 05/27/22 Potential to Achieve Goals: Good    Frequency Min 3X/week     Co-evaluation               AM-PAC PT "6 Clicks" Mobility  Outcome Measure Help needed turning from your back to your side while in a flat bed without using bedrails?: None Help needed moving from lying on your back to sitting on the side of a flat bed without using bedrails?: None Help needed moving to and from a bed to a chair (including a wheelchair)?: A Little Help needed standing up from a chair using your arms (e.g., wheelchair or bedside  chair)?: A Little Help needed to walk in hospital room?: A Little Help needed climbing 3-5 steps with a railing? : A Little 6 Click Score: 20    End of Session Equipment Utilized During Treatment: Gait belt Activity Tolerance: Patient tolerated treatment well Patient left: in bed;with call bell/phone within reach (seated EOB) Nurse Communication: Mobility status PT Visit Diagnosis: Muscle weakness (generalized) (M62.81);Difficulty in walking, not elsewhere classified (R26.2)     Time: 8341-9622 PT Time Calculation (min) (ACUTE ONLY): 28 min   Charges:   PT Evaluation $PT Eval Moderate Complexity: 1 Mod PT Treatments $Gait Training: 8-22 mins  PT Time Calculation  PT Start Time (ACUTE ONLY) 1107  PT Stop Time (ACUTE ONLY) 1118  PT Time Calculation (min) (ACUTE ONLY) 11 min  PT Treatments  $Gait Training 8-22 mins           Verner Mould, DPT Acute Rehabilitation Services Office [phone]  05/13/22 10:55 AM

## 2022-05-14 DIAGNOSIS — G35 Multiple sclerosis: Secondary | ICD-10-CM | POA: Diagnosis not present

## 2022-05-14 DIAGNOSIS — R269 Unspecified abnormalities of gait and mobility: Secondary | ICD-10-CM | POA: Diagnosis not present

## 2022-05-14 DIAGNOSIS — G0491 Myelitis, unspecified: Secondary | ICD-10-CM | POA: Diagnosis not present

## 2022-05-14 DIAGNOSIS — I10 Essential (primary) hypertension: Secondary | ICD-10-CM | POA: Diagnosis not present

## 2022-05-14 LAB — MISC LABCORP TEST (SEND OUT): Labcorp test code: 505310

## 2022-05-14 LAB — GLUCOSE, CAPILLARY
Glucose-Capillary: 158 mg/dL — ABNORMAL HIGH (ref 70–99)
Glucose-Capillary: 145 mg/dL — ABNORMAL HIGH (ref 70–99)

## 2022-05-14 LAB — NEUROMYELITIS OPTICA AUTOAB, IGG: NMO-IgG: <1.5 U/mL (ref 0.0–3.0)

## 2022-05-14 NOTE — Progress Notes (Signed)
[facility] PROGRESS NOTE    Progress Note  Karla Howell  WOE:[id number] DOB: Oct 17, 1965 DOA: 05/11/2022 PCP: Marin Olp, MD     Brief Narrative:   Karla Howell is an 56 y.o. female past medical history significant for rheumatoid arthritis who is currently being worked up as an outpatient by Dr. Posey Pronto neurology for cervical myelopathy comes in gait instability and progressive weakness with inability to walk that started the day to prior to admission   Assessment/Plan:   Possible multiple sclerosis/  Myelitis ([place]) MRI cervical C-spine shows cervical spinal T2 hyperintensity. Continue IV Solu-Medrol for total 5 days pulsed dose. She remains asymptomatic continue to check CBGs before meals and at bedtime. ESR 44, A1c of 5.3, ANA, double-stranded anti-Ro and anti-La are negative. Anti-Mogg and anti-NMO are pending.   No major side effects.  Rheumatoid arthritis: On Humira every 2 weeks.  Essential hypertension: Well-controlled Norvasc continue current regimen.  DVT prophylaxis: lovenox Family Communication:none Status is: Inpatient Remains inpatient appropriate because: Possible MS flare    Code Status:     Code Status Orders  (From admission, onward)           Start     Ordered   05/11/22 1749  Full code  Continuous        05/11/22 1748           Code Status History     Date Active Date Inactive Code Status Order ID Comments User Context   01/25/2013 1130 01/26/2013 1220 Full Code [id number]  Olga Millers, MD Inpatient         IV Access:   Peripheral IV   Procedures and diagnostic studies:   No results found.   Medical Consultants:   None.   Subjective:    Karla Howell relates her weight is unchanged.  Objective:    Vitals:   05/13/22 0752 05/13/22 1635 05/13/22 2222 05/14/22 0517  BP: (!) 130/91 123/86 129/81 120/78  Pulse: 60 99 85 78  Resp: _0 Temp: 97.8 F (36.6 C)  97.9 F (36.6 C) 98.3 F (36.8 C) 97.9 F (36.6 C)  TempSrc: Oral Oral Oral Oral  SpO2:  97% 99% 99%   SpO2: 99 %   Intake/Output Summary (Last 24 hours) at 05/14/2022 0757 Last data filed at 05/13/2022 1857 Gross per 24 hour  Intake 504 ml  Output --  Net 504 ml    There were no vitals filed for this visit.  Exam: General exam: In no acute distress. Respiratory system: Good air movement and clear to auscultation. Cardiovascular system: S1 & S2 heard, RRR. No JVD. Gastrointestinal system: Abdomen is nondistended, soft and nontender.  Extremities: No pedal edema. Skin: No rashes, lesions or ulcers Psychiatry: Judgement and insight appear normal. Mood & affect appropriate.  Data Reviewed:    Labs: Basic Metabolic Panel: Recent Labs  Lab 05/11/22 0558 05/12/22 0317  NA 141 138  K 4.2 4.0  CL 105 106  CO2 28 24  GLUCOSE 97 96  BUN 14 14  CREATININE 1.02* 1.04*  CALCIUM 9.1 8.9    GFR Estimated Creatinine Clearance: 60 mL/min (A) (by C-G formula based on SCr of 1.04 mg/dL (H)). Liver Function Tests: No results for input(s): "AST", "ALT", "ALKPHOS", "BILITOT", "PROT", "ALBUMIN" in the last 168 hours. No results for input(s): "LIPASE", "AMYLASE" in the last 168 hours. No results for input(s): "AMMONIA" in the last 168 hours. Coagulation profile No results for input(s): "INR", "  PROTIME" in the last 168 hours. COVID-19 Labs  Recent Labs    05/11/22 1949  CRP 0.6     Lab Results  Component Value Date   SARSCOV2NAA Not Detected 07/26/2019   [place] Not Detected 12/22/2018    CBC: Recent Labs  Lab 05/11/22 0558 05/12/22 0317  WBC 4.4 6.2  NEUTROABS 2.0  --   HGB 12.1 11.7*  HCT 37.8 37.2  MCV 89.8 91.6  PLT 275 263    Cardiac Enzymes: No results for input(s): "CKTOTAL", "CKMB", "CKMBINDEX", "TROPONINI" in the last 168 hours. BNP (last 3 results) No results for input(s): "PROBNP" in the last 8760 hours. CBG: No results for input(s):  "GLUCAP" in the last 168 hours. D-Dimer: No results for input(s): "DDIMER" in the last 72 hours. Hgb A1c: Recent Labs    05/12/22 0317  HGBA1C 5.3    Lipid Profile: No results for input(s): "CHOL", "HDL", "LDLCALC", "TRIG", "CHOLHDL", "LDLDIRECT" in the last 72 hours. Thyroid function studies: No results for input(s): "TSH", "T4TOTAL", "T3FREE", "THYROIDAB" in the last 72 hours.  Invalid input(s): "FREET3" Anemia work up: No results for input(s): "VITAMINB12", "FOLATE", "FERRITIN", "TIBC", "IRON", "RETICCTPCT" in the last 72 hours. Sepsis Labs: Recent Labs  Lab 05/11/22 0558 05/12/22 0317  WBC 4.4 6.2    Microbiology No results found for this or any previous visit (from the past 240 hour(s)).   Medications:    amLODipine  2.5 mg Oral Daily   docusate sodium  100 mg Oral BID   gabapentin  100 mg Oral Daily   gabapentin  300 mg Oral QHS   pantoprazole  40 mg Oral BID   Continuous Infusions:  methylPREDNISolone (SOLU-MEDROL) injection Stopped (05/13/22 1109)      LOS: 3 days   Karla Howell  [facility]  05/14/2022, 7:57 AM

## 2022-05-14 NOTE — Progress Notes (Signed)
Patient refused 5pm glucose check

## 2022-05-14 NOTE — Progress Notes (Signed)
Mobility Specialist - Progress Note   05/14/22 1500  Mobility  Activity Ambulated with assistance in hallway  Level of Assistance Contact guard assist, steadying assist  Assistive Device None  Distance Ambulated (ft) 1100 ft  Activity Response Tolerated well  $Mobility charge 1 Mobility    Pt received sitting EOB agreeable to mobility. Requested to practice ambulation w/o AD. MinG to correct slight stumble x1 and for safety. Left in room w/ all needs met.   [place] Specialist Please contact via SecureChat or Rehab office at [phone]

## 2022-05-14 NOTE — Progress Notes (Signed)
Physical Therapy Treatment Patient Details Name: Karla Howell MRN: [id number] DOB: 11/28/1965 Today's Date: 05/14/2022   History of Present Illness Patient is 56 year old female presents with worsening right lower extremity weakness and bilateral lower extremity sensory deficits.  MRI is consistent with demyelinating disease, leading to a diagnosis of new onset MS. PMH significant of rheumatoid arthritis, IBS, hypertension who is undergoing work-up for cervical myelopathy.    PT Comments    Pt making good progress.  She is motivated and eager to learn.  Pt was up moving in room at PT arrival.  PT session focused on higher level balance and addressing gait deviations (tends to invert R ankle).  Continue to progress as able.     Recommendations for follow up therapy are one component of a multi-disciplinary discharge planning process, led by the attending physician.  Recommendations may be updated based on patient status, additional functional criteria and insurance authorization.  Follow Up Recommendations  Outpatient PT     Assistance Recommended at Discharge PRN  Patient can return home with the following     Equipment Recommendations  Cane    Recommendations for Other Services       Precautions / Restrictions Precautions Precautions: None     Mobility  Bed Mobility               General bed mobility comments: Pt up and ambulating at arrival    Transfers Overall transfer level: Needs assistance Equipment used: None Transfers: Sit to/from Stand Sit to Stand: Modified independent (Device/Increase time)           General transfer comment: use of hands for sit<>stand, no cues or assist    Ambulation/Gait Ambulation/Gait assistance: Modified independent (Device/Increase time) Gait Distance (Feet): 500 Feet Assistive device: Straight cane Gait Pattern/deviations: Step-through pattern Gait velocity: decreased     General Gait Details: Pt able to  ambulate in room independently.  PT session focused on balance and addressing gait deviations.  Pt with slower than normal gait speed - worked on changing speeds during session.  Also, pt reports R ankle feels tight.  Noted tendency to invert slightly with dorsiflexion.  Provided demonstration and worked on neutral dorsiflexion with gait.   Stairs Stairs: Yes Stairs assistance: Min guard Stair Management: One rail Right, Step to pattern, Forwards Number of Stairs: 12 General stair comments: up/down with min guard for safety; L leg feels stronger so cued for up with L and down with R   Wheelchair Mobility    Modified Rankin (Stroke Patients Only)       Balance Overall balance assessment: Needs assistance   Sitting balance-Leahy Scale: Normal     Standing balance support: No upper extremity supported Standing balance-Leahy Scale: Good Standing balance comment: Pt able to ambulate and perform task in room without assist               High Level Balance Comments: Worked on ambulation balance with cane including changing speed and head turns (up/down/L/R).  Pt had no LOB but did slow and with high step pattern on R LE with challenges            Cognition Arousal/Alertness: Awake/alert Behavior During Therapy: WFL for tasks assessed/performed Overall Cognitive Status: Within Functional Limits for tasks assessed                                 General Comments: very pleasant and eager  for education        Exercises General Exercises - Lower Extremity Ankle Circles/Pumps: AROM, Strengthening, 20 reps, Right (Ankle pumps with focus on neutral position x 20) Other Exercises Other Exercises: ankle eversion with R tband x 10    General Comments General comments (skin integrity, edema, etc.): Pt asking for exercises/weights. Provided Red T band and educated on some UE exercises as well as ankle eversion with assist to hold T band.  Discussed energy management  and not overdoing exercises.      Pertinent Vitals/Pain Pain Assessment Pain Assessment: No/denies pain    Home Living                          Prior Function            PT Goals (current goals can now be found in the care plan section) Progress towards PT goals: Progressing toward goals    Frequency    Min 3X/week      PT Plan Current plan remains appropriate    Co-evaluation              AM-PAC PT "6 Clicks" Mobility   Outcome Measure  Help needed turning from your back to your side while in a flat bed without using bedrails?: None Help needed moving from lying on your back to sitting on the side of a flat bed without using bedrails?: None Help needed moving to and from a bed to a chair (including a wheelchair)?: None Help needed standing up from a chair using your arms (e.g., wheelchair or bedside chair)?: None Help needed to walk in hospital room?: None Help needed climbing 3-5 steps with a railing? : A Little 6 Click Score: 23    End of Session Equipment Utilized During Treatment: Gait belt Activity Tolerance: Patient tolerated treatment well Patient left: in bed;with call bell/phone within reach;with family/visitor present Nurse Communication: Mobility status PT Visit Diagnosis: Muscle weakness (generalized) (M62.81);Difficulty in walking, not elsewhere classified (R26.2)     Time: 0354-6568 PT Time Calculation (min) (ACUTE ONLY): 28 min  Charges:  $Gait Training: 8-22 mins $Therapeutic Exercise: 8-22 mins                     Abran Richard, PT Acute Rehab [facility] [place] 05/14/2022, 4:10 PM

## 2022-05-14 NOTE — Evaluation (Signed)
Occupational Therapy Evaluation Patient Details Name: Karla Howell MRN: [id number] DOB: [birth year] Today's Date: 05/14/2022   History of Present Illness Patient is 56 year old female presents with worsening right lower extremity weakness and bilateral lower extremity sensory deficits.  MRI is consistent with demyelinating disease, leading to a diagnosis of new onset MS. PMH significant of rheumatoid arthritis, IBS, hypertension who is undergoing work-up for cervical myelopathy.   Clinical Impression   Pt is typically independent in ADL and mobility, works full time remotely for insurance/consulting, drives. Today she is overall supervision level for UB and LB ADL completion from seated and standing position (see ADL section below for more details). She ambulated in the room using the IV pole for stability at min guard. She reports decreased sensation in RUE (thumb and index finger) and decreased fine motor/increased fatigue. OT provided energy conservation education as well as fine motor HEP. OT will follow acutely to maximize safety and independence in ADL and functional transfers- but do not anticipate need for post-acute OT at this time (Pt and mother in agreement). Next session to focus on  follow up for energy conservation as well as fine motor especially in RUE (dominant hand)     Recommendations for follow up therapy are one component of a multi-disciplinary discharge planning process, led by the attending physician.  Recommendations may be updated based on patient status, additional functional criteria and insurance authorization.   Follow Up Recommendations  No OT follow up     Assistance Recommended at Discharge PRN  Patient can return home with the following Assistance with cooking/housework    Functional Status Assessment  Patient has had a recent decline in their functional status and demonstrates the ability to make significant improvements in function in a reasonable  and predictable amount of time.  Equipment Recommendations  None recommended by OT    Recommendations for Other Services       Precautions / Restrictions Precautions Precautions: Fall Precaution Comments: denies falls in last 6 months Restrictions Weight Bearing Restrictions: No      Mobility Bed Mobility Overal bed mobility: Modified Independent             General bed mobility comments: OT managing IV lines    Transfers Overall transfer level: Needs assistance Equipment used: None Transfers: Sit to/from Stand Sit to Stand: Modified independent (Device/Increase time)           General transfer comment: use of hands for sit<>stand, no cues or assist      Balance Overall balance assessment: Needs assistance, Mild deficits observed, not formally tested Sitting-balance support: Feet supported, No upper extremity supported Sitting balance-Leahy Scale: Normal     Standing balance support: During functional activity, Single extremity supported Standing balance-Leahy Scale: Fair Standing balance comment: benefits from pushing IV pole                           ADL either performed or assessed with clinical judgement   ADL Overall ADL's : Needs assistance/impaired Eating/Feeding: Set up;With adaptive utensils Eating/Feeding Details (indicate cue type and reason): provided red handles for eating, and decrease fatigue in hands Grooming: Wash/dry hands;Wash/dry face;Supervision/safety;Standing Grooming Details (indicate cue type and reason): standing sink level, able to open toothpaste, unzip bathroom toiletries bag. During brushing, hand unable to grasp to prevent rotation of toothbrush - but could complete. Upper Body Bathing: Supervision/ safety Upper Body Bathing Details (indicate cue type and reason): educated on fall prevention in  the shower Lower Body Bathing: Supervison/ safety;Sitting/lateral leans Lower Body Bathing Details (indicate cue type and  reason): Pt has built in seat - she plans to use Upper Body Dressing : Modified independent   Lower Body Dressing: Supervision/safety;Sitting/lateral leans Lower Body Dressing Details (indicate cue type and reason): able to don/doff socks and shoes via figure 4 Toilet Transfer: Min guard;Ambulation Toilet Transfer Details (indicate cue type and reason): utilized IV pole for balance Toileting- Clothing Manipulation and Hygiene: Supervision/safety;Sit to/from stand       Functional mobility during ADLs: Min guard (pushing IV pole - encouraged practice with Rollator/RW)       Vision Baseline Vision/History: 1 Wears glasses Ability to See in Adequate Light: 0 Adequate Patient Visual Report: No change from baseline Vision Assessment?: No apparent visual deficits     Perception     Praxis      Pertinent Vitals/Pain Pain Assessment Pain Assessment: No/denies pain     Hand Dominance Right   Extremity/Trunk Assessment Upper Extremity Assessment Upper Extremity Assessment: RUE deficits/detail RUE Deficits / Details: overall functional during session, Pt reports decreased sensation in thumb and index finger. reports dropping items - educated on built up handles and provided 2 red handles, grasp 4+/5, decreased fine motor touching end of fingertips RUE Sensation: decreased light touch (thumb and pointer finger only) RUE Coordination: decreased fine motor (subtle)   Lower Extremity Assessment Lower Extremity Assessment: Defer to PT evaluation       Communication Communication Communication: No difficulties   Cognition Arousal/Alertness: Awake/alert Behavior During Therapy: WFL for tasks assessed/performed Overall Cognitive Status: Within Functional Limits for tasks assessed                                 General Comments: very pleasant and eager for education     General Comments  mother present in room. Educated on energy conservation, provided handout     Exercises Exercises: Other exercises Other Exercises Other Exercises: fine motor coordination exercises handout provided   Shoulder Instructions      Home Living Family/patient expects to be discharged to:: Private residence Living Arrangements: Alone Available Help at Discharge: Family Type of Home: House Home Access: Stairs to enter Technical brewer of Steps: 3+1 Entrance Stairs-Rails: None Home Layout: Two level Alternate Level Stairs-Number of Steps: 14-17 (has a landing) Alternate Level Stairs-Rails: Right;Left (switches halfway) Bathroom Shower/Tub: Occupational psychologist: Standard Bathroom Accessibility: Yes   Home Equipment: None;Shower seat - built in;Grab bars - tub/shower   Additional Comments: Pt plans to stay with her mom initially      Prior Functioning/Environment Prior Level of Function : Working/employed;Driving;Independent/Modified Independent             Mobility Comments: works from home, driving food shopping.. ADLs Comments: independent        OT Problem List: Decreased strength;Decreased activity tolerance;Impaired balance (sitting and/or standing);Decreased knowledge of use of DME or AE;Impaired sensation;Impaired UE functional use      OT Treatment/Interventions: Self-care/ADL training;Neuromuscular education;DME and/or AE instruction;Therapeutic activities;Balance training;Patient/family education    OT Goals(Current goals can be found in the care plan section) Acute Rehab OT Goals Patient Stated Goal: get back to PLOF OT Goal Formulation: With patient/family Time For Goal Achievement: 05/28/22 Potential to Achieve Goals: Good ADL Goals Pt Will Perform Eating: with modified independence;with adaptive utensils Pt Will Perform Grooming: with modified independence;standing Pt Will Perform Upper Body Dressing: with  modified independence;sitting Pt Will Perform Lower Body Dressing: with modified independence;sit to/from  stand Pt Will Transfer to Toilet: with modified independence;ambulating Pt Will Perform Toileting - Clothing Manipulation and hygiene: with modified independence;sit to/from stand Pt/caregiver will Perform Home Exercise Program: Right Upper extremity;Independently;With written HEP provided Additional ADL Goal #1: Pt will verbalize at least 3 energy conservation strategies for ADL with no cues  OT Frequency: Min 2X/week    Co-evaluation              AM-PAC OT "6 Clicks" Daily Activity     Outcome Measure Help from another person eating meals?: A Little Help from another person taking care of personal grooming?: A Little Help from another person toileting, which includes using toliet, bedpan, or urinal?: None Help from another person bathing (including washing, rinsing, drying)?: A Little Help from another person to put on and taking off regular upper body clothing?: None Help from another person to put on and taking off regular lower body clothing?: None 6 Click Score: 21   End of Session Equipment Utilized During Treatment: Gait belt Nurse Communication: Mobility status (no chair alarm)  Activity Tolerance: Patient tolerated treatment well Patient left: in chair;with family/visitor present;with call bell/phone within reach  OT Visit Diagnosis: Other abnormalities of gait and mobility (R26.89);Muscle weakness (generalized) (M62.81);Other symptoms and signs involving the nervous system (R29.898)                Time: 1005-1031 OT Time Calculation (min): 26 min Charges:  OT General Charges $OT Visit: 1 Visit OT Evaluation $OT Eval Moderate Complexity: 1 Mod OT Treatments $Self Care/Home Management : 8-22 mins Jesse Sans OTR/L Acute Rehabilitation Services Office: [place] 05/14/2022, 12:19 PM

## 2022-05-15 ENCOUNTER — Telehealth: Payer: Self-pay | Admitting: Neurology

## 2022-05-15 DIAGNOSIS — G0491 Myelitis, unspecified: Secondary | ICD-10-CM | POA: Diagnosis not present

## 2022-05-15 DIAGNOSIS — I10 Essential (primary) hypertension: Secondary | ICD-10-CM | POA: Diagnosis not present

## 2022-05-15 DIAGNOSIS — G35 Multiple sclerosis: Secondary | ICD-10-CM | POA: Diagnosis not present

## 2022-05-15 DIAGNOSIS — R269 Unspecified abnormalities of gait and mobility: Secondary | ICD-10-CM | POA: Diagnosis not present

## 2022-05-15 LAB — GLUCOSE, CAPILLARY
Glucose-Capillary: 111 mg/dL — ABNORMAL HIGH (ref 70–99)
Glucose-Capillary: 122 mg/dL — ABNORMAL HIGH (ref 70–99)

## 2022-05-15 NOTE — Progress Notes (Signed)
Neurology progress note  Subjective: Finished day 5 of 5 of steroids. Feeling well. Able to ambulate  Exam: Vitals:   05/15/22 0747 05/15/22 1547  BP: 122/76 131/84  Pulse: 68 88  Resp: 16 17  Temp: 98.2 F (36.8 C) 98.1 F (36.7 C)  SpO2: 95% 92%    Physical Exam Gen: A&Ox4, NAD HEENT: Atraumatic, normocephalic; oropharynx clear, tongue without atrophy or fasciculations. Resp: CTAB, normal work of breathing CV: RRR, extremities appear well-perfused. Abd: soft/NT/ND Extrem: Nml bulk; no cyanosis, clubbing, or edema.  Neuro: *MS: A&O x4. Follows multi-step commands.  *Speech: no dysarthria or aphasia, able to name and repeat. *CN:    I: Deferred   II,III: PERRLA, VFF by confrontation, optic discs not visualized 2/2 pupillary constriction   III,IV,VI: EOMI w/o nystagmus, no ptosis   V: Sensation intact from V1 to V3 to LT   VII: Eyelid closure was full.  Smile symmetric.   VIII: Hearing intact to voice   IX,X: Voice normal, palate elevates symmetrically    XI: SCM/trap 5/5 bilat   XII: Tongue protrudes midline, no atrophy or fasciculations  *Motor:   Normal bulk.  No tremor, rigidity or bradykinesia. No pronator drift.   Strength: Dlt Bic Tri WE WrF FgS Gr HF KnF KnE PlF DoF    Left 5 5 4+ '5 5 5 5 '$ 4+ 5 5 4+ 4+    Right '5 5 5 5 5 5 5 4 4 '$ 4+ 4+ 4   *Sensory: SILT. No double-simultaneous extinction.  *Coordination:  Finger-to-nose, heel-to-shin, rapid alternating motions were intact. *Reflexes:  2+ brisk and symmetric throughout without clonus; toes down-going bilat *Gait: deferred   Impression: 56 yo woman with new dx MS p/w RLE>LLE weakness now improved after 5 days steroids. See consult note from 12/3 for full findings on initial presentation and imaging summary.   Recommendations: 1) OK to discharge tomorrow 2) f/u with Dr. Posey Pronto 9/13   Su Monks, MD [facility] [phone]  If [place], please page neurology on call as listed in  [place].  **Any copied and pasted documentation in this note was written by me in another application not billed for and pasted by me into this document.

## 2022-05-15 NOTE — Progress Notes (Signed)
[facility] PROGRESS NOTE    Progress Note  Karla Howell  WUJ:[id number] DOB: [birth year] DOA: 05/11/2022 PCP: Marin Olp, MD     Brief Narrative:   Karla Howell is an 56 y.o. female past medical history significant for rheumatoid arthritis who is currently being worked up as an outpatient by Dr. Posey Pronto neurology for cervical myelopathy comes in gait instability and progressive weakness with inability to walk that started the day to prior to admission   Assessment/Plan:   Possible multiple sclerosis/  Myelitis ([place]) MRI cervical C-spine shows cervical spinal T2 hyperintensity. Continue IV Solu-Medrol for total 5 days pulsed dose, last dose tonight 05/15/2022 Checking CBGs blood glucoses remained stable. ESR 44, A1c of 5.3, ANA, double-stranded anti-Ro and anti-La are negative. Anti-Mogg and anti-NMO are pending.   No major side effects.  PT evaluated patient recommended outpatient PT  Rheumatoid arthritis: On Humira every 2 weeks.  Essential hypertension: Well-controlled Norvasc continue current regimen.  DVT prophylaxis: lovenox Family Communication:none Status is: Inpatient Remains inpatient appropriate because: Possible MS flare    Code Status:     Code Status Orders  (From admission, onward)           Start     Ordered   05/11/22 1749  Full code  Continuous        05/11/22 1748           Code Status History     Date Active Date Inactive Code Status Order ID Comments User Context   01/25/2013 1130 01/26/2013 1220 Full Code [id number]  Olga Millers, MD Inpatient         IV Access:   Peripheral IV   Procedures and diagnostic studies:   No results found.   Medical Consultants:   None.   Subjective:    Karla Howell no new complaints tolerating her diet  Objective:    Vitals:   05/14/22 0901 05/14/22 1956 05/15/22 0527 05/15/22 0747  BP: 126/83 115/70 123/86 122/76  Pulse: 79 66 65 68   Resp: _0 Temp: 97.8 F (36.6 C) 98 F (36.7 C) 97.9 F (36.6 C) 98.2 F (36.8 C)  TempSrc: Oral Oral Oral Oral  SpO2: 99% 96% 98% 95%   SpO2: 95 %   Intake/Output Summary (Last 24 hours) at 05/15/2022 0849 Last data filed at 05/14/2022 2205 Gross per 24 hour  Intake 300 ml  Output --  Net 300 ml    There were no vitals filed for this visit.  Exam: General exam: In no acute distress. Respiratory system: Good air movement and clear to auscultation. Cardiovascular system: S1 & S2 heard, RRR. No JVD. Gastrointestinal system: Abdomen is nondistended, soft and nontender.  Extremities: No pedal edema. Skin: No rashes, lesions or ulcers Psychiatry: Judgement and insight appear normal. Mood & affect appropriate.  Data Reviewed:    Labs: Basic Metabolic Panel: Recent Labs  Lab 05/11/22 0558 05/12/22 0317  NA 141 138  K 4.2 4.0  CL 105 106  CO2 28 24  GLUCOSE 97 96  BUN 14 14  CREATININE 1.02* 1.04*  CALCIUM 9.1 8.9    GFR CrCl cannot be calculated (Unknown ideal weight.). Liver Function Tests: No results for input(s): "AST", "ALT", "ALKPHOS", "BILITOT", "PROT", "ALBUMIN" in the last 168 hours. No results for input(s): "LIPASE", "AMYLASE" in the last 168 hours. No results for input(s): "AMMONIA" in the last 168 hours. Coagulation profile No results for input(s): "INR", "PROTIME" in the last  168 hours. COVID-19 Labs  No results for input(s): "DDIMER", "FERRITIN", "LDH", "CRP" in the last 72 hours.   Lab Results  Component Value Date   [place] Not Detected 07/26/2019   [place] Not Detected 12/22/2018    CBC: Recent Labs  Lab 05/11/22 0558 05/12/22 0317  WBC 4.4 6.2  NEUTROABS 2.0  --   HGB 12.1 11.7*  HCT 37.8 37.2  MCV 89.8 91.6  PLT 275 263    Cardiac Enzymes: No results for input(s): "CKTOTAL", "CKMB", "CKMBINDEX", "TROPONINI" in the last 168 hours. BNP (last 3 results) No results for input(s): "PROBNP" in the last 8760  hours. CBG: Recent Labs  Lab 05/14/22 1106 05/14/22 2048 05/15/22 0748  GLUCAP 158* 145* 111*   D-Dimer: No results for input(s): "DDIMER" in the last 72 hours. Hgb A1c: No results for input(s): "HGBA1C" in the last 72 hours.  Lipid Profile: No results for input(s): "CHOL", "HDL", "LDLCALC", "TRIG", "CHOLHDL", "LDLDIRECT" in the last 72 hours. Thyroid function studies: No results for input(s): "TSH", "T4TOTAL", "T3FREE", "THYROIDAB" in the last 72 hours.  Invalid input(s): "FREET3" Anemia work up: No results for input(s): "VITAMINB12", "FOLATE", "FERRITIN", "TIBC", "IRON", "RETICCTPCT" in the last 72 hours. Sepsis Labs: Recent Labs  Lab 05/11/22 0558 05/12/22 0317  WBC 4.4 6.2    Microbiology No results found for this or any previous visit (from the past 240 hour(s)).   Medications:    amLODipine  2.5 mg Oral Daily   docusate sodium  100 mg Oral BID   gabapentin  100 mg Oral Daily   gabapentin  300 mg Oral QHS   pantoprazole  40 mg Oral BID   Continuous Infusions:  methylPREDNISolone (SOLU-MEDROL) injection 1,000 mg (05/14/22 0926)      LOS: 4 days   Charlynne Cousins  [facility]  05/15/2022, 8:49 AM

## 2022-05-15 NOTE — Progress Notes (Signed)
Occupational Therapy Treatment Patient Details Name: Karla Howell MRN: [id number] DOB: 11/11/1965 Today's Date: 05/15/2022   History of present illness Patient is 56 year old female presents with worsening right lower extremity weakness and bilateral lower extremity sensory deficits.  MRI is consistent with demyelinating disease, leading to a diagnosis of new onset MS. PMH significant of rheumatoid arthritis, IBS, hypertension who is undergoing work-up for cervical myelopathy.   OT comments  Pt at this time was educated further on compensations due to decrease in sensation in RUE and strength on how to complete hygiene/eating and FM activities. Pt also educated and shown on how to increase in strength for RUE. [name] had no further questions at this time and agreed to continue at this time.  Pt currently with functional limitations due to the deficits listed below (see OT Problem List). Pt will benefit from skilled OT to increase their safety and independence with ADL and functional mobility for ADL to facilitate discharge to venue listed below.     Recommendations for follow up therapy are one component of a multi-disciplinary discharge planning process, led by the attending physician.  Recommendations may be updated based on patient status, additional functional criteria and insurance authorization.    Follow Up Recommendations  No OT follow up     Assistance Recommended at Discharge PRN  Patient can return home with the following  Assistance with cooking/housework   Equipment Recommendations  None recommended by OT    Recommendations for Other Services      Precautions / Restrictions Precautions Precautions: None Precaution Comments: denies falls in last 6 months Restrictions Weight Bearing Restrictions: No       Mobility Bed Mobility Overal bed mobility:  (Pt presented sitting at EOB)                  Transfers                          Balance Overall balance assessment: Needs assistance Sitting-balance support: Feet supported, No upper extremity supported Sitting balance-Leahy Scale: Normal                                     ADL either performed or assessed with clinical judgement   ADL Overall ADL's : Needs assistance/impaired Eating/Feeding: Modified independent;Cueing for compensatory techinques   Grooming: Wash/dry hands;Wash/dry face;Modified independent;Sitting   Upper Body Bathing: Modified independent;Sitting   Lower Body Bathing: Supervison/ safety;Sitting/lateral leans   Upper Body Dressing : Modified independent;Sitting   Lower Body Dressing: Modified independent;Sit to/from stand                      Extremity/Trunk Assessment Upper Extremity Assessment Upper Extremity Assessment: RUE deficits/detail RUE Deficits / Details: Pt reporting that sensation is slowly progression at this time but still feeling some numbness/weak in R hand and shoulder but able to use. RUE Sensation: decreased light touch RUE Coordination: decreased fine motor   Lower Extremity Assessment Lower Extremity Assessment: Defer to PT evaluation        Vision   Vision Assessment?: No apparent visual deficits   Perception     Praxis      Cognition Arousal/Alertness: Awake/alert Behavior During Therapy: WFL for tasks assessed/performed Overall Cognitive Status: Within Functional Limits for tasks assessed  Exercises Exercises: Hand exercises Hand Exercises Wrist Flexion: AROM, Right Wrist Extension: AROM, Right, Seated Wrist Ulnar Deviation: AROM, Right, 5 reps, Seated Digit Composite Flexion: Strengthening, 10 reps, Right Composite Extension: Right, 5 reps, Squeeze ball Digit Composite Abduction: Right, Strengthening Digit Composite Adduction: Right, Squeeze ball Digit Lifts: Right, Seated, Squeeze ball Thumb Abduction: Right,  Bar weights/barbell Thumb Adduction: Right, 5 reps, Squeeze ball    Shoulder Instructions       General Comments      Pertinent Vitals/ Pain       Pain Assessment Pain Assessment: No/denies pain  Home Living                                          Prior Functioning/Environment              Frequency  Min 2X/week        Progress Toward Goals  OT Goals(current goals can now be found in the care plan section)  Progress towards OT goals: Progressing toward goals  Acute Rehab OT Goals Patient Stated Goal: to get back to my normal routine of things OT Goal Formulation: With patient/family Time For Goal Achievement: 05/28/22 Potential to Achieve Goals: Good ADL Goals Pt Will Perform Eating: with modified independence;with adaptive utensils Pt Will Perform Grooming: with modified independence;standing Pt Will Perform Upper Body Dressing: with modified independence;sitting Pt Will Perform Lower Body Dressing: with modified independence;sit to/from stand Pt Will Transfer to Toilet: with modified independence;ambulating Pt Will Perform Toileting - Clothing Manipulation and hygiene: with modified independence;sit to/from stand Pt/caregiver will Perform Home Exercise Program: Right Upper extremity;Independently;With written HEP provided Additional ADL Goal #1: Pt will verbalize at least 3 energy conservation strategies for ADL with no cues  Plan Discharge plan remains appropriate    Co-evaluation                 AM-PAC OT "6 Clicks" Daily Activity     Outcome Measure   Help from another person eating meals?: None Help from another person taking care of personal grooming?: None Help from another person toileting, which includes using toliet, bedpan, or urinal?: None Help from another person bathing (including washing, rinsing, drying)?: None Help from another person to put on and taking off regular upper body clothing?: None Help from another  person to put on and taking off regular lower body clothing?: None 6 Click Score: 24    End of Session    OT Visit Diagnosis: Other abnormalities of gait and mobility (R26.89);Muscle weakness (generalized) (M62.81);Other symptoms and signs involving the nervous system (R29.898)   Activity Tolerance Patient tolerated treatment well   Patient Left in bed;with call bell/phone within reach;with family/visitor present   Nurse Communication Mobility status        Time: 3570-1779 OT Time Calculation (min): 20 min  Charges: OT General Charges $OT Visit: 1 Visit OT Treatments $Therapeutic Exercise: 8-22 mins  Joeseph Amor OTR/L  Acute Rehab Services  [phone] office number    Joeseph Amor 05/15/2022, 11:37 AM

## 2022-05-15 NOTE — Telephone Encounter (Signed)
Please add to my schedule on 12/13 at 10:50a.

## 2022-05-15 NOTE — TOC Initial Note (Signed)
Transition of Care [facility]) - Initial/Assessment Note    Patient Details  Name: Karla Howell MRN: [id number] Date of Birth: Jun 11, 1965  [facility] of [facility] CM/SW Contact:    Marilu Favre, RN Phone Number: 05/15/2022, 12:30 PM  Clinical Narrative:                 Damaris Schooner to patient at bedside, regarding PT recommending OP PT at neuro rehab and cane. PAtient in agreement. Referral for OP PT entered , information placed on AVS, asked MD to sign.  Ordered cane with Erasmo Downer with [facility] , asked MD to sign order    Expected Discharge Plan: Home/Self Care Barriers to Discharge: Continued Medical Work up   Patient Goals and CMS Choice Patient states their goals for this hospitalization and ongoing recovery are:: to return to home CMS Medicare.gov Compare Post Acute Care list provided to:: Patient Choice offered to / list presented to : Patient  Expected Discharge Plan and Services Expected Discharge Plan: Home/Self Care   Discharge Planning Services: CM Consult Post Acute Care Choice:  (OP PT) Living arrangements for the past 2 months: Single Family Home                 DME Arranged: Kasandra Knudsen DME Agency: [employer] Date DME Agency Contacted: 05/15/22 Time DME Agency Contacted: 34 Representative spoke with at DME Agency: Erasmo Downer HH Arranged: NA          Prior Living Arrangements/Services Living arrangements for the past 2 months: Single Family Home Lives with:: Self Patient language and need for interpreter reviewed:: Yes Do you feel safe going back to the place where you live?: Yes      Need for Family Participation in Patient Care: No (Comment) Care giver support system in place?: Yes (comment)   Criminal Activity/Legal Involvement Pertinent to Current Situation/Hospitalization: No - Comment as needed  Activities of Daily Living      Permission Sought/Granted   Permission granted to share information with : No              Emotional  Assessment Appearance:: Appears stated age Attitude/Demeanor/Rapport: Engaged Affect (typically observed): Accepting Orientation: : Oriented to Self, Oriented to Place, Oriented to  Time, Oriented to Situation Alcohol / Substance Use: Not Applicable Psych Involvement: No (comment)  Admission diagnosis:  Gait disturbance [R26.9] Myelitis ([place]) [G04.91] Patient Active Problem List   Diagnosis Date Noted   MS (multiple sclerosis) ([place]) 05/12/2022   Myelitis ([place]) 05/11/2022   Immunocompromised ([place]) 07/05/2021   GERD (gastroesophageal reflux disease) 10/04/2018   Insomnia 10/04/2018   Rheumatoid arthritis ([place]) 10/04/2018   H/O total hysterectomy 03/27/2018   Acute non-recurrent maxillary sinusitis 09/27/2016   Essential hypertension 07/19/2014   Generalized anxiety disorder 03/15/2014   Allergic rhinitis 03/26/2013   Anemia 01/21/2012   IBS 10/25/2009   PCP:  Marin Olp, MD Pharmacy:   [facility] [place], [address] AT [address] [id number] Phone: [phone] Fax: [phone]     Social Determinants of Health (SDOH) Interventions    Readmission Risk Interventions     No data to display

## 2022-05-15 NOTE — Telephone Encounter (Signed)
Pt called in stating she is currently hospitalized and will be discharged in the morning. She is wondering if she needs to be seen or what she needs to do. The hospital states she needs medication management.

## 2022-05-16 DIAGNOSIS — R269 Unspecified abnormalities of gait and mobility: Secondary | ICD-10-CM | POA: Diagnosis not present

## 2022-05-16 DIAGNOSIS — G35 Multiple sclerosis: Secondary | ICD-10-CM | POA: Diagnosis not present

## 2022-05-16 LAB — JC VIRUS DNA,PCR (WHOLE BLOOD): JC Virus DNA, PCR, Blood: NEGATIVE

## 2022-05-16 NOTE — Discharge Summary (Signed)
Physician Discharge Summary  Karla Howell IFO:[id number] DOB: [birth year] DOA: 05/11/2022  PCP: Marin Olp, MD  Admit date: 05/11/2022 Discharge date: 05/16/2022  Admitted From: Home Disposition:  Home  Recommendations for Outpatient Follow-up:  Follow up with PCP in 1-2 weeks Please obtain BMP/CBC in one week   Home Health:No Equipment/Devices:None  Discharge Condition:Stable CODE STATUS:Full Diet recommendation: Heart Healthy  Brief/Interim Summary:  56 y.o. female past medical history significant for rheumatoid arthritis who is currently being worked up as an outpatient by Dr. Posey Pronto neurology for cervical myelopathy comes in gait instability and progressive weakness with inability to walk that started the day to prior to admission    Discharge Diagnoses:  Principal Problem:   MS (multiple sclerosis) ([place]) Active Problems:   Essential hypertension   Rheumatoid arthritis ([place])   Myelitis ([place])  Possible multiple sclerosis flare/myelitis: MRI C-spine shows cervical C-spine T2 hyperintensity neurology was consulted recommended pulse steroids for 5 days. She was monitored CBG remained stable as well as her blood pressure. ESR was 44. Anti-Mogg and anti-NMO are pending.   Rheumatoid arthritis: Resume Humira as an outpatient.  Essential hypertension: No change made to her medication.   Discharge Instructions  Discharge Instructions     Ambulatory referral to Physical Therapy   Complete by: As directed    Diet - low sodium heart healthy   Complete by: As directed    Increase activity slowly   Complete by: As directed       Allergies as of 05/16/2022   No Known Allergies      Medication List     TAKE these medications    acetaminophen 650 MG CR tablet Commonly known as: TYLENOL Take 1,300 mg by mouth as needed for pain. What changed: Another medication with the same name was removed. Continue taking this medication, and follow the  directions you see here.   amLODipine 2.5 MG tablet Commonly known as: NORVASC Take 1 tablet (2.5 mg total) by mouth daily.   ascorbic acid 500 MG tablet Commonly known as: VITAMIN C Take 500 mg by mouth daily. Reported on 10/02/2015   celecoxib 200 MG capsule Commonly known as: CELEBREX Take 200 mg by mouth daily as needed for mild pain.   gabapentin 300 MG capsule Commonly known as: NEURONTIN Take 300 mg by mouth at bedtime. Take one capsule at bedtime   gabapentin 100 MG capsule Commonly known as: NEURONTIN Take 100 mg by mouth 2 (two) times daily as needed (nerve pain).   Humira Pen 40 MG/0.4ML Pnkt Generic drug: Adalimumab Inject 40 mg into the muscle every 14 (fourteen) days.   methocarbamol 500 MG tablet Commonly known as: ROBAXIN Take 1 tablet (500 mg total) by mouth every 8 (eight) hours as needed for muscle spasms. What changed: when to take this   VITAMIN B-12 PO Take 1 capsule by mouth daily.   Vitamin D 50 MCG (2000 UT) Caps Take 2,000 Units by mouth daily.   ZINC GLUCONATE PO Take 1 tablet by mouth daily.   zolpidem 5 MG tablet Commonly known as: AMBIEN TAKE 1 TABLET (5 MG TOTAL) BY MOUTH EVERY DAY AT BEDTIME AS NEEDED FOR SLEEP What changed: See the new instructions.               Durable Medical Equipment  (From admission, onward)           Start     Ordered   05/15/22 1022  For home use only DME [employer]  Once        05/15/22 [facility] Follow up.   Specialty: Rehabilitation Contact information: [address] [place] [id number] [place] [phone]               No Known Allergies  Consultations: Neurology   Procedures/Studies: MR BRAIN W WO CONTRAST  Result Date: 05/11/2022 CLINICAL DATA:  Difficulty walking EXAM: MRI HEAD WITHOUT AND WITH CONTRAST MRI CERVICAL SPINE WITHOUT AND WITH CONTRAST MRI  CERVICAL THORACIC WITHOUT AND CONTRAST CONTRAST:  7.48m GADAVIST GADOBUTROL 1 MMOL/ML IV SOLN TECHNIQUE: Multiplanar, multiecho pulse sequences of the brain and surrounding structures, and cervical and thoracic spine were obtained without and with intravenous contrast. COMPARISON:  04/11/2022 thoracic spine MRI 04/02/2022 cervical spine MRI 04/11/2022 brain MRI FINDINGS: MRI HEAD FINDINGS Brain: No acute infarct, mass effect or extra-axial collection. No acute or chronic hemorrhage. There are new hyperintense T2-weighted signal lesions within both occipital lobes. The midline structures are normal. There is a single new focus of parenchymal contrast enhancement in the posterior left parietal white matter (series 33, image 26). This is new since the most recent study. Vascular: Major flow voids are preserved. Skull and upper cervical spine: Normal calvarium and skull base. Visualized upper cervical spine and soft tissues are normal. Sinuses/Orbits:No paranasal sinus fluid levels or advanced mucosal thickening. No mastoid or middle ear effusion. Normal orbits. MRI CERVICAL SPINE FINDINGS Alignment: Physiologic. Vertebrae: No fracture, evidence of discitis, or bone lesion. Cord: Increased size of right hemicord hyperintense T2-weighted signal lesion centered at C4 but spanning the C3-5 levels. Lesion of the right dorsal cord at C7 is unchanged. There is mild contrast enhancement of the larger lesion most notably at the C5 level. paraspinal tissues: Negative Disc levels: No spinal canal stenosis. MRI THORACIC SPINE FINDINGS Alignment: Normal Vertebrae: No fracture, evidence of discitis, or bone lesion. Cord: There is a new lesion in the right hemicord at the T2-3 level with associated contrast enhancement. Paraspinal and other soft tissues: Negative Disc levels: No spinal canal stenosis IMPRESSION: 1. New hyperintense T2-weighted signal lesions within both occipital lobes and within the cervical and thoracic spinal  cord, consistent with demyelinating disease. CSF sampling should be considered. 2. New/worsening spinal cord lesions at C3-5 and T2-3 with associated contrast enhancement. 3. Unchanged C7 lesion. Electronically Signed   By: KUlyses JarredM.D.   On: 05/11/2022 19:46   MR CERVICAL SPINE W WO CONTRAST  Result Date: 05/11/2022 CLINICAL DATA:  Difficulty walking EXAM: MRI HEAD WITHOUT AND WITH CONTRAST MRI CERVICAL SPINE WITHOUT AND WITH CONTRAST MRI CERVICAL THORACIC WITHOUT AND CONTRAST CONTRAST:  7.745mGADAVIST GADOBUTROL 1 MMOL/ML IV SOLN TECHNIQUE: Multiplanar, multiecho pulse sequences of the brain and surrounding structures, and cervical and thoracic spine were obtained without and with intravenous contrast. COMPARISON:  04/11/2022 thoracic spine MRI 04/02/2022 cervical spine MRI 04/11/2022 brain MRI FINDINGS: MRI HEAD FINDINGS Brain: No acute infarct, mass effect or extra-axial collection. No acute or chronic hemorrhage. There are new hyperintense T2-weighted signal lesions within both occipital lobes. The midline structures are normal. There is a single new focus of parenchymal contrast enhancement in the posterior left parietal white matter (series 33, image 26). This is new since the most recent study. Vascular: Major flow voids are preserved. Skull and upper cervical spine: Normal calvarium and skull base. Visualized upper cervical spine and soft tissues are normal.  Sinuses/Orbits:No paranasal sinus fluid levels or advanced mucosal thickening. No mastoid or middle ear effusion. Normal orbits. MRI CERVICAL SPINE FINDINGS Alignment: Physiologic. Vertebrae: No fracture, evidence of discitis, or bone lesion. Cord: Increased size of right hemicord hyperintense T2-weighted signal lesion centered at C4 but spanning the C3-5 levels. Lesion of the right dorsal cord at C7 is unchanged. There is mild contrast enhancement of the larger lesion most notably at the C5 level. paraspinal tissues: Negative Disc levels: No  spinal canal stenosis. MRI THORACIC SPINE FINDINGS Alignment: Normal Vertebrae: No fracture, evidence of discitis, or bone lesion. Cord: There is a new lesion in the right hemicord at the T2-3 level with associated contrast enhancement. Paraspinal and other soft tissues: Negative Disc levels: No spinal canal stenosis IMPRESSION: 1. New hyperintense T2-weighted signal lesions within both occipital lobes and within the cervical and thoracic spinal cord, consistent with demyelinating disease. CSF sampling should be considered. 2. New/worsening spinal cord lesions at C3-5 and T2-3 with associated contrast enhancement. 3. Unchanged C7 lesion. Electronically Signed   By: Ulyses Jarred M.D.   On: 05/11/2022 19:46   MR THORACIC SPINE W WO CONTRAST  Result Date: 05/11/2022 CLINICAL DATA:  Difficulty walking EXAM: MRI HEAD WITHOUT AND WITH CONTRAST MRI CERVICAL SPINE WITHOUT AND WITH CONTRAST MRI CERVICAL THORACIC WITHOUT AND CONTRAST CONTRAST:  7.55m GADAVIST GADOBUTROL 1 MMOL/ML IV SOLN TECHNIQUE: Multiplanar, multiecho pulse sequences of the brain and surrounding structures, and cervical and thoracic spine were obtained without and with intravenous contrast. COMPARISON:  04/11/2022 thoracic spine MRI 04/02/2022 cervical spine MRI 04/11/2022 brain MRI FINDINGS: MRI HEAD FINDINGS Brain: No acute infarct, mass effect or extra-axial collection. No acute or chronic hemorrhage. There are new hyperintense T2-weighted signal lesions within both occipital lobes. The midline structures are normal. There is a single new focus of parenchymal contrast enhancement in the posterior left parietal white matter (series 33, image 26). This is new since the most recent study. Vascular: Major flow voids are preserved. Skull and upper cervical spine: Normal calvarium and skull base. Visualized upper cervical spine and soft tissues are normal. Sinuses/Orbits:No paranasal sinus fluid levels or advanced mucosal thickening. No mastoid or middle  ear effusion. Normal orbits. MRI CERVICAL SPINE FINDINGS Alignment: Physiologic. Vertebrae: No fracture, evidence of discitis, or bone lesion. Cord: Increased size of right hemicord hyperintense T2-weighted signal lesion centered at C4 but spanning the C3-5 levels. Lesion of the right dorsal cord at C7 is unchanged. There is mild contrast enhancement of the larger lesion most notably at the C5 level. paraspinal tissues: Negative Disc levels: No spinal canal stenosis. MRI THORACIC SPINE FINDINGS Alignment: Normal Vertebrae: No fracture, evidence of discitis, or bone lesion. Cord: There is a new lesion in the right hemicord at the T2-3 level with associated contrast enhancement. Paraspinal and other soft tissues: Negative Disc levels: No spinal canal stenosis IMPRESSION: 1. New hyperintense T2-weighted signal lesions within both occipital lobes and within the cervical and thoracic spinal cord, consistent with demyelinating disease. CSF sampling should be considered. 2. New/worsening spinal cord lesions at C3-5 and T2-3 with associated contrast enhancement. 3. Unchanged C7 lesion. Electronically Signed   By: KUlyses JarredM.D.   On: 05/11/2022 19:46   (Echo, Carotid, EGD, Colonoscopy, ERCP)    Subjective:  No Complaints Discharge Exam: Vitals:   05/15/22 2112 05/16/22 0542  BP: 129/83 (!) 140/85  Pulse: 71 (!) 58  Resp:    Temp: 98 F (36.7 C) 98.2 F (36.8 C)  SpO2: 97% 100%  Vitals:   05/15/22 0747 05/15/22 1547 05/15/22 2112 05/16/22 0542  BP: 122/76 131/84 129/83 (!) 140/85  Pulse: 68 88 71 (!) 58  Resp: 16 17    Temp: 98.2 F (36.8 C) 98.1 F (36.7 C) 98 F (36.7 C) 98.2 F (36.8 C)  TempSrc: Oral Oral Oral Oral  SpO2: 95% 92% 97% 100%    General: Pt is alert, awake, not in acute distress Cardiovascular: RRR, S1/S2 +, no rubs, no gallops Respiratory: CTA bilaterally, no wheezing, no rhonchi Abdominal: Soft, NT, ND, bowel sounds + Extremities: no edema, no cyanosis    The  results of significant diagnostics from this hospitalization (including imaging, microbiology, ancillary and laboratory) are listed below for reference.     Microbiology: No results found for this or any previous visit (from the past 240 hour(s)).   Labs: BNP (last 3 results) No results for input(s): "BNP" in the last 8760 hours. Basic Metabolic Panel: Recent Labs  Lab 05/11/22 0558 05/12/22 0317  NA 141 138  K 4.2 4.0  CL 105 106  CO2 28 24  GLUCOSE 97 96  BUN 14 14  CREATININE 1.02* 1.04*  CALCIUM 9.1 8.9   Liver Function Tests: No results for input(s): "AST", "ALT", "ALKPHOS", "BILITOT", "PROT", "ALBUMIN" in the last 168 hours. No results for input(s): "LIPASE", "AMYLASE" in the last 168 hours. No results for input(s): "AMMONIA" in the last 168 hours. CBC: Recent Labs  Lab 05/11/22 0558 05/12/22 0317  WBC 4.4 6.2  NEUTROABS 2.0  --   HGB 12.1 11.7*  HCT 37.8 37.2  MCV 89.8 91.6  PLT 275 263   Cardiac Enzymes: No results for input(s): "CKTOTAL", "CKMB", "CKMBINDEX", "TROPONINI" in the last 168 hours. BNP: Invalid input(s): "POCBNP" CBG: Recent Labs  Lab 05/14/22 1106 05/14/22 2048 05/15/22 0748 05/15/22 1131  GLUCAP 158* 145* 111* 122*   D-Dimer No results for input(s): "DDIMER" in the last 72 hours. Hgb A1c No results for input(s): "HGBA1C" in the last 72 hours. Lipid Profile No results for input(s): "CHOL", "HDL", "LDLCALC", "TRIG", "CHOLHDL", "LDLDIRECT" in the last 72 hours. Thyroid function studies No results for input(s): "TSH", "T4TOTAL", "T3FREE", "THYROIDAB" in the last 72 hours.  Invalid input(s): "FREET3" Anemia work up No results for input(s): "VITAMINB12", "FOLATE", "FERRITIN", "TIBC", "IRON", "RETICCTPCT" in the last 72 hours. Urinalysis    Component Value Date/Time   BILIRUBINUR n 04/04/2016 1022   PROTEINUR trace 04/04/2016 1022   UROBILINOGEN 0.2 04/04/2016 1022   NITRITE n 04/04/2016 1022   LEUKOCYTESUR small (1+) (A)  04/04/2016 1022   Sepsis Labs Recent Labs  Lab 05/11/22 0558 05/12/22 0317  WBC 4.4 6.2   Microbiology No results found for this or any previous visit (from the past 240 hour(s)).   SIGNED:   Charlynne Cousins, MD  [facility] 05/16/2022, 8:25 AM Pager   If 7PM-7AM, please contact night-coverage [email or web address] Password [email or web address]

## 2022-05-19 ENCOUNTER — Telehealth: Payer: Self-pay

## 2022-05-19 NOTE — Patient Outreach (Signed)
  Care Coordination TOC Note Transition Care Management Follow-up Telephone Call Date of discharge and from where: 05/16/22-[facility]  Dx: "gait disturbance,MS" How have you been since you were released from the hospital? Patient voices she is taking it easy and doing fairly well. She is using cane to help her get around No falls. Any questions or concerns? No  Items Reviewed: Did the pt receive and understand the discharge instructions provided? Yes  Medications obtained and verified? Yes  Other? Yes  Any new allergies since your discharge? No  Dietary orders reviewed? Yes Do you have support at home? Yes -temporarily has been staying with her mom  [place] and Equipment/Supplies: Were home health services ordered? not applicable If so, what is the name of the agency? N/A  Has the agency set up a time to come to the patient's home? not applicable Were any new equipment or medical supplies ordered?  Yes: cane What is the name of the medical supply agency? [employer] Were you able to get the supplies/equipment? yes Do you have any questions related to the use of the equipment or supplies? No  Functional Questionnaire: (I = Independent and D = Dependent) ADLs: I  Bathing/Dressing- I  Meal Prep- I  Eating- I  Maintaining continence- I  Transferring/Ambulation- I  Managing Meds- I  Follow up appointments reviewed:  PCP Hospital f/u appt confirmed? No  -patient will call office today to make an appt. [facility] f/u appt confirmed? Yes  Scheduled to see Dr.Patel on 05/21/22 @ 10:50am. Are transportation arrangements needed? No  If their condition worsens, is the pt aware to call PCP or go to the Emergency Dept.? Yes Was the patient provided with contact information for the PCP's office or ED? Yes Was to pt encouraged to call back with questions or concerns? Yes  SDOH assessments and interventions completed:   Yes SDOH Interventions Today    Flowsheet Row Most  Recent Value  SDOH Interventions   Food Insecurity Interventions Intervention Not Indicated  Transportation Interventions Intervention Not Indicated       Care Coordination Interventions:  Education provided    Encounter Outcome:  Pt. Visit Completed    Enzo Montgomery, RN,BSN,CCM [facility] Telephonic Care Management Coordinator Direct Phone: [phone] Toll Free: [phone] Fax: [phone]

## 2022-05-20 LAB — COPPER, SERUM: Copper: 106 ug/dL (ref 80–158)

## 2022-05-21 ENCOUNTER — Ambulatory Visit (INDEPENDENT_AMBULATORY_CARE_PROVIDER_SITE_OTHER): Payer: BC Managed Care – PPO | Admitting: Neurology

## 2022-05-21 ENCOUNTER — Encounter: Payer: Self-pay | Admitting: Neurology

## 2022-05-21 VITALS — BP 124/86 | HR 83 | Ht 64.0 in | Wt 166.0 lb

## 2022-05-21 DIAGNOSIS — G35 Multiple sclerosis: Secondary | ICD-10-CM

## 2022-05-21 MED ORDER — BACLOFEN 10 MG PO TABS: ORAL_TABLET | ORAL | 1 refills | Status: DC

## 2022-05-21 NOTE — Patient Instructions (Addendum)
You can visit [email or web address] for more information  Stop gabapentin during the daytime and continue gabapentin '300mg'$  at bedtime  For muscle stiffness, you can take baclofen '5mg'$  - '10mg'$  at bedtime.  Start vitamin D 5000 IU daily  You can visit MS Society website for more information [email or web address]  Return to clinic 2 months

## 2022-05-21 NOTE — Progress Notes (Signed)
Follow-up Visit   Date: 05/21/2022    Karla Howell MRN: [id number] DOB: March 01, 1966    Karla Howell is a 56 y.o. right-handed African American female with hypertension and RA returning to the clinic for follow-up of multiple sclerosis.  The patient was accompanied to the clinic by self.  IMPRESSION/PLAN: Newly diagnosed relapsing remitting multiple sclerosis diagnosed 05/2022 with disease burden involving the subcortical white matter, cervical (C4-5) and thoracic (T2-3) cord.  Imaging was personally viewed with patient. Clinically, she reports ongoing paresthesias of the right hand, paresthesias of the legs, and stiffness of the legs.  She completed IVMP while hospitalized.  Today's discussion was largely spent reviewing her work-up, the diagnosis of multiple sclerosis, and management options.  Given her cord involvement, I recommend that we start Tysabri.  Risks and benefits discussed and she would like to proceed.  JCV antibody is negative. NMO and antiMOG negative. Given her classic imaging findings, it was mutually decided to cancel upcoming lumbar puncture.  I also recommend that she continue PT and start vitamin D 5000IU daily. She is up-to-date with eye exam Temporary handicap placard completed All questions answered  Return to clinic in 2 months  --------------------------------------------- History of present illness: In July 2023, she was walking 1-2 miles and at the end of her walk, she began to have numbness and tingling involving the lower legs.  Symptoms lasted 5-10 minutes and self-resolved.  Her symptoms have become constant since October and she also has tightness in the legs.  She takes gabapentin '100mg'$  during the day as needed and '300mg'$  at bedtime which provides minimal relief.  Right arm feels a little weaker.  No weakness in the legs.    In September, she began having right sided shoulder, neck, and arm pain. She was also having numbness into  the right hand, especially the thumb and index finger.  She has tingling in all of the fingers.  No numbness/tingling in the left hand. She has weakness in the right arm.  She is going to PT.     She saw Dr. Ron Agee at [facility] for these symptoms and specifically the right sided neck pain.  He ordered MRI cervical spine which showed T2 hyperintensity involving the cervical cord at C3-4 to C4-5.  Subsequent imaging of the thoracic cord shows similar changes at T3 and T12.  MRI brain was normal.  She is here for further evaluation.    She denies cramps.  Earlier in 2023, she had 4-5 spells of left arm and leg drawing up.   UPDATE 05/21/2022:  She went to the ER on 12/3 because of worsening leg heaviness, numbness, and imbalance.  She underwent MRI brain, cervical spine, and thoracic spine which showed new white matter lesions involving the brain as well as previously seen white matter changes at C4-5 and T2-3. Because of high likelihood of MS, she was started on solumedrol.  Since completing steroids, she feels that the right ankle is less stiff.  Unfortunately, there has been no significant change in the paresthesias or balance.  She is using a cane now.    Medications:  Current Outpatient Medications on File Prior to Visit  Medication Sig Dispense Refill   acetaminophen (TYLENOL) 650 MG CR tablet Take 1,300 mg by mouth as needed for pain.     amLODipine (NORVASC) 2.5 MG tablet Take 1 tablet (2.5 mg total) by mouth daily. 90 tablet 3   celecoxib (CELEBREX) 200 MG capsule Take 200 mg  by mouth daily as needed for mild pain.     Cholecalciferol (VITAMIN D) 50 MCG (2000 UT) CAPS Take 2,000 Units by mouth daily.     Cyanocobalamin (VITAMIN B-12 PO) Take 1 capsule by mouth daily.     gabapentin (NEURONTIN) 100 MG capsule Take 100 mg by mouth 2 (two) times daily as needed (nerve pain).     gabapentin (NEURONTIN) 300 MG capsule Take 300 mg by mouth at bedtime. Take one capsule at bedtime      methocarbamol (ROBAXIN) 500 MG tablet Take 1 tablet (500 mg total) by mouth every 8 (eight) hours as needed for muscle spasms. (Patient taking differently: Take 500 mg by mouth daily as needed for muscle spasms.) 30 tablet 0   vitamin C (ASCORBIC ACID) 500 MG tablet Take 500 mg by mouth daily. Reported on 10/02/2015     ZINC GLUCONATE PO Take 1 tablet by mouth daily.     zolpidem (AMBIEN) 5 MG tablet TAKE 1 TABLET (5 MG TOTAL) BY MOUTH EVERY DAY AT BEDTIME AS NEEDED FOR SLEEP (Patient taking differently: Take 5 mg by mouth at bedtime as needed for sleep.) 30 tablet 5   HUMIRA PEN 40 MG/0.4ML PNKT Inject 40 mg into the muscle every 14 (fourteen) days. (Patient not taking: Reported on 05/21/2022)     No current facility-administered medications on file prior to visit.    Allergies: No Known Allergies  Vital Signs:  BP 124/86   Pulse 83   Ht '5\' 4"'$  (1.626 m)   Wt 166 lb (75.3 kg)   LMP 01/06/2013   SpO2 98%   BMI 28.49 kg/m   Neurological Exam: MENTAL STATUS including orientation to time, place, person, recent and remote memory, attention span and concentration, language, and fund of knowledge is normal.  Speech is not dysarthric.  CRANIAL NERVES:   Pupils equal round and reactive to light.  Normal conjugate, extra-ocular eye movements in all directions of gaze.  No ptosis.  Face is symmetric. Palate elevates symmetrically.  Tongue is midline.  MOTOR:  Motor strength is 5/5 in all extremities, except trace weakness of the right hand with finger abduction.  No atrophy, fasciculations or abnormal movements.  No pronator drift.  Tone is normal.    MSRs:  Reflexes are 2+/4 throughout, except 3+/4 bilateral patella.  No ankle clonus is present today.  Hoffmans present bilaterally.  SENSORY:  Intact to vibration throughout.  COORDINATION/GAIT:  Normal finger-to- nose-finger.  Intact rapid alternating movements bilaterally.  Gait wide-based based and stable with a cane, appears unsteady when  unassisted   Data: MRI brain, cervical, and thoracic spine wwo contrast 05/11/2022: 1. New hyperintense T2-weighted signal lesions within both occipital lobes and within the cervical and thoracic spinal cord, consistent with demyelinating disease. CSF sampling should be considered. 2. New/worsening spinal cord lesions at C3-5 and T2-3 with associated contrast enhancement. 3. Unchanged C7 lesion.     Total time spent reviewing records, interview, history/exam, documentation, and coordination of care on day of encounter:  50 min   Thank you for allowing me to participate in patient's care.  If I can answer any additional questions, I would be pleased to do so.    Sincerely,    Angline Schweigert K. Posey Pronto, DO

## 2022-05-22 ENCOUNTER — Encounter: Payer: Self-pay | Admitting: *Deleted

## 2022-05-23 ENCOUNTER — Inpatient Hospital Stay: Admit: 2022-05-23 | Payer: BC Managed Care – PPO

## 2022-05-25 ENCOUNTER — Other Ambulatory Visit: Payer: BC Managed Care – PPO

## 2022-05-26 ENCOUNTER — Encounter: Payer: Self-pay | Admitting: Physical Therapy

## 2022-05-26 ENCOUNTER — Encounter: Payer: Self-pay | Admitting: Family Medicine

## 2022-05-26 ENCOUNTER — Ambulatory Visit: Payer: BC Managed Care – PPO | Attending: Internal Medicine | Admitting: Physical Therapy

## 2022-05-26 ENCOUNTER — Other Ambulatory Visit: Payer: Self-pay

## 2022-05-26 ENCOUNTER — Ambulatory Visit (INDEPENDENT_AMBULATORY_CARE_PROVIDER_SITE_OTHER): Payer: BC Managed Care – PPO | Admitting: Family Medicine

## 2022-05-26 VITALS — BP 123/77 | HR 90

## 2022-05-26 VITALS — BP 130/84 | HR 91 | Temp 98.2°F | Ht 64.0 in | Wt 167.6 lb

## 2022-05-26 DIAGNOSIS — G35 Multiple sclerosis: Secondary | ICD-10-CM

## 2022-05-26 DIAGNOSIS — I10 Essential (primary) hypertension: Secondary | ICD-10-CM | POA: Diagnosis not present

## 2022-05-26 DIAGNOSIS — R2681 Unsteadiness on feet: Secondary | ICD-10-CM | POA: Diagnosis not present

## 2022-05-26 DIAGNOSIS — R202 Paresthesia of skin: Secondary | ICD-10-CM | POA: Insufficient documentation

## 2022-05-26 DIAGNOSIS — R2689 Other abnormalities of gait and mobility: Secondary | ICD-10-CM | POA: Diagnosis not present

## 2022-05-26 DIAGNOSIS — R29818 Other symptoms and signs involving the nervous system: Secondary | ICD-10-CM | POA: Diagnosis not present

## 2022-05-26 DIAGNOSIS — M6281 Muscle weakness (generalized): Secondary | ICD-10-CM | POA: Insufficient documentation

## 2022-05-26 DIAGNOSIS — Z23 Encounter for immunization: Secondary | ICD-10-CM

## 2022-05-26 DIAGNOSIS — M0600 Rheumatoid arthritis without rheumatoid factor, unspecified site: Secondary | ICD-10-CM

## 2022-05-26 MED ORDER — ZOLPIDEM TARTRATE ER 6.25 MG PO TBCR: 6.2500 mg | EXTENDED_RELEASE_TABLET | Freq: Every evening | ORAL | 5 refills | Status: DC | PRN

## 2022-05-26 NOTE — Progress Notes (Signed)
Phone [phone] In person visit   Subjective:   Karla Howell is a 56 y.o. year old very pleasant female patient who presents for/with See problem oriented charting Chief Complaint  Patient presents with   Hospitalization Follow-up    Pt in office for hospital f/u; pt just got last Covid vaccine on Friday; flu vaccine requested in today's visit; pt states all is well since coming home from inpatient; following up with with neurology as requested to do. Wants to discuss medication adjustments   Past Medical History-  Patient Active Problem List   Diagnosis Date Noted   MS (multiple sclerosis) ([place]) 05/12/2022    Priority: High   Rheumatoid arthritis ([place]) 10/04/2018    Priority: High   Essential hypertension 07/19/2014    Priority: Medium    Generalized anxiety disorder 03/15/2014    Priority: Medium    IBS 10/25/2009    Priority: Medium    Immunocompromised ([place]) 07/05/2021    Priority: Low   GERD (gastroesophageal reflux disease) 10/04/2018    Priority: Low   Insomnia 10/04/2018    Priority: Low   H/O total hysterectomy 03/27/2018    Priority: Low   Allergic rhinitis 03/26/2013    Priority: Low   Anemia 01/21/2012    Priority: Low   Myelitis ([place]) 05/11/2022   Acute non-recurrent maxillary sinusitis 09/27/2016    Medications- reviewed and updated Current Outpatient Medications  Medication Sig Dispense Refill   acetaminophen (TYLENOL) 650 MG CR tablet Take 1,300 mg by mouth as needed for pain.     amLODipine (NORVASC) 2.5 MG tablet Take 1 tablet (2.5 mg total) by mouth daily. 90 tablet 3   baclofen (LIORESAL) 10 MG tablet Take 5-'10mg'$  at bedtime as needed (Patient taking differently: Take 10 mg by mouth. Take 5-'10mg'$  at bedtime as needed pt taking PRN) 30 each 1   celecoxib (CELEBREX) 200 MG capsule Take 200 mg by mouth daily as needed for mild pain.     Cholecalciferol (VITAMIN D) 50 MCG (2000 UT) CAPS Take 2,000 Units by mouth daily.     Cyanocobalamin  (VITAMIN B-12 PO) Take 1 capsule by mouth daily.     gabapentin (NEURONTIN) 300 MG capsule Take 300 mg by mouth at bedtime. Take one capsule at bedtime     vitamin C (ASCORBIC ACID) 500 MG tablet Take 500 mg by mouth daily. Reported on 10/02/2015     ZINC GLUCONATE PO Take 1 tablet by mouth daily.     zolpidem (AMBIEN CR) 6.25 MG CR tablet Take 1 tablet (6.25 mg total) by mouth at bedtime as needed for sleep (do not drive for 8 hours after taking). 30 tablet 5   HUMIRA PEN 40 MG/0.4ML PNKT Inject 40 mg into the muscle every 14 (fourteen) days. (Patient not taking: Reported on 05/26/2022)     methocarbamol (ROBAXIN) 500 MG tablet Take 1 tablet (500 mg total) by mouth every 8 (eight) hours as needed for muscle spasms. (Patient not taking: Reported on 05/26/2022) 30 tablet 0   No current facility-administered medications for this visit.     Objective:  BP 130/84 (BP Location: Left Arm)   Pulse 91   Temp 98.2 F (36.8 C) (Temporal)   Ht '5\' 4"'$  (1.626 m)   Wt 167 lb 9.6 oz (76 kg)   LMP 01/06/2013   SpO2 97%   BMI 28.77 kg/m  Gen: NAD, resting comfortably CV: RRR no murmurs rubs or gallops Lungs: CTAB no crackles, wheeze, rhonchi Ext: no edema Skin:  warm, dry     Assessment and Plan   # Multiple sclerosis S: multiple sclerosis diagnosed 05/2022 with disease burden involving the subcortical white matter, cervical (C4-5) and thoracic (T2-3) cord.  She was hospitalized from 05/11/2022 to 05/16/2022-presented with worsening cervical myelopathy-has been referred by Dr. Posey Pronto her neurologist. Was treated with methylprednisolone/solu-medrol x 5 days -plan is to treated with Tysabri- with plans to start within next week  She reports improved symptoms on solumedrol- leg and ankle better- hand with some numbness/tingling A/P: MS thankfully improved on steroids and hopefully will further improve on Tysabri- continue close follow up with DR. Patel - still on gabapentin through Dr. Ron Agee- we may be  able to wean off of this because some of pain issues likely related to [place]. She may actually go ahead and try the '100mg'$  dose tonight - prefers to minimize meds -starts neuro PT today  #hypertension S: medication: Amlodipine 2.5 mg BP Readings from Last 3 Encounters:  05/26/22 130/84  05/21/22 124/86  05/16/22 (!) 148/87  A/P: reasonable control- continue current medications    # Rheumatoid arthritis S: Medication: Remains on Humira- last dose nov 22- no worsening symptoms after the 2 week period from last injection- solumedrol may have given benefit A/P: currently on hold with humira but upcoming appointment in January with rheumatology- she will update on current course and they will decide on next steps.    # Insomnia S:using 5 mg of ambien but having more periods of waking up. Waking up after 3-4 hours. Has sleep onset and maintenance.   A/P: insomnia now with more maintenance issues- had been just sleep onset before- we are going to try extended release 6.25 mg to see if this gives benefit for both  - we did talk about 10 mg- generally restricted for women to 5 mg but short term may be ok especially if we can get off of the higher dose before age 67  # Health maintenance-patient wants to make sure to be fully up-to-date on vaccinations.  Her last COVID-vaccine on Friday.  She wanted to get flu shot today which we completed   Recommended follow up: No follow-ups on file. Future Appointments  Date Time Provider [place]  05/26/2022  1:15 PM Bary Richard, [place] [facility]  06/20/2022  3:20 PM Marin Olp, MD [facility]  07/29/2022  1:30 PM Narda Amber K, DO [facility] None    Lab/Order associations:   ICD-10-CM   1. MS (multiple sclerosis) ([place])  G35     2. Essential hypertension  I10     3. Rheumatoid arthritis with negative rheumatoid factor, involving unspecified site ([place])  M06.00     4. Need for immunization against influenza  Z23 Flu Vaccine QUAD  50moIM (Fluarix, Fluzone & Alfiuria Quad PF)      Meds ordered this encounter  Medications   zolpidem (AMBIEN CR) 6.25 MG CR tablet    Sig: Take 1 tablet (6.25 mg total) by mouth at bedtime as needed for sleep (do not drive for 8 hours after taking).    Dispense:  30 tablet    Refill:  5    Return precautions advised.  SGarret Reddish MD

## 2022-05-26 NOTE — Therapy (Signed)
OUTPATIENT PHYSICAL THERAPY NEURO EVALUATION   Patient Name: Karla Howell MRN: [id number] DOB:Oct 25, 1965, 56 y.o., female Today's Date: 05/26/2022   PCP: Marin Olp, MD REFERRING PROVIDER: Charlynne Cousins, MD (pt is following up with neurologist Dr. Narda Amber)  END OF SESSION:  PT End of Session - 05/26/22 1316     Visit Number 1    Number of Visits 9    Date for PT Re-Evaluation 07/25/22    Authorization Type BLUE CROSS BLUE SHIELD    PT Start Time 1309   Pt agreeable to starting early   PT Stop Time 1349    PT Time Calculation (min) 40 min    Equipment Utilized During Treatment Gait belt    Activity Tolerance Patient tolerated treatment well    Behavior During Therapy WFL for tasks assessed/performed             Past Medical History:  Diagnosis Date   Allergy    Anemia    history of    Essential hypertension    GERD (gastroesophageal reflux disease)    Heartburn    Hemorrhoids    IBS (irritable bowel syndrome)    Rheumatoid arthritis ([place])    Past Surgical History:  Procedure Laterality Date   BILATERAL SALPINGECTOMY Bilateral 01/25/2013   Procedure: BILATERAL SALPINGECTOMY;  Surgeon: Olga Millers, MD;  Location: [facility];  Service: Gynecology;  Laterality: Bilateral;   LAPAROSCOPIC ASSISTED VAGINAL HYSTERECTOMY N/A 01/25/2013   Procedure: LAPAROSCOPIC ASSISTED VAGINAL HYSTERECTOMY;  Surgeon: Olga Millers, MD;  Location: [facility];  Service: Gynecology;  Laterality: N/A;   thumb surgery Left    Patient Active Problem List   Diagnosis Date Noted   MS (multiple sclerosis) ([place]) 05/12/2022   Myelitis ([place]) 05/11/2022   Immunocompromised ([place]) 07/05/2021   GERD (gastroesophageal reflux disease) 10/04/2018   Insomnia 10/04/2018   Rheumatoid arthritis ([place]) 10/04/2018   H/O total hysterectomy 03/27/2018   Acute non-recurrent maxillary sinusitis 09/27/2016   Essential hypertension 07/19/2014   Generalized anxiety disorder 03/15/2014    Allergic rhinitis 03/26/2013   Anemia 01/21/2012   IBS 10/25/2009    ONSET DATE: 05/15/2022 (referral date)  REFERRING DIAG: G35 (ICD-10-CM) - MS (multiple sclerosis) ([place])  THERAPY DIAG:  Muscle weakness (generalized)  Unsteadiness on feet  Other abnormalities of gait and mobility  Other symptoms and signs involving the nervous system  Paresthesia of skin  Rationale for Evaluation and Treatment: Rehabilitation  SUBJECTIVE:  SUBJECTIVE STATEMENT: "It's mostly a balance thing and at times it feels like my legs are elastic or bend weird like they're going to give out.  The tightness in my legs is ongoing around the knees and ankles.  It feels like I'm walking on pebbles on the bottoms of my feet and my right hand and foot is swollen, but mildly better today.  My right hand has that waking up feeling.  My concern is the longer I'm on my computer I have a kind of jolting sensation down my right arm."  She will be starting Tysabri soon. Pt accompanied by: family member - mom (pt may begin driving herself soon)  PERTINENT HISTORY: rheumatoid arthritis, IBS, hypertension  Newly diagnosed relapsing remitting multiple sclerosis diagnosed 05/2022 with disease burden involving the subcortical white matter, cervical (C4-5) and thoracic (T2-3) cord.  PAIN:  Are you having pain? No - moreso the N/T, but nothing she considers pain  PRECAUTIONS: Fall  WEIGHT BEARING RESTRICTIONS: No  FALLS: Has patient fallen in last 6 months? No  LIVING ENVIRONMENT: Lives with: lives alone Lives in: House/apartment Stairs: Yes: Internal: 14 steps; staggered alternating sides and External: 3 steps; none Has following equipment at home: Single point cane, Shower bench, and Grab bars  PLOF: Independent  PATIENT  GOALS: "Get me to 99%"  "To feel like I'm more balanced when I'm walking"  OBJECTIVE:   DIAGNOSTIC FINDINGS: MRI Thoracic 05/11/2022  IMPRESSION:  1. New hyperintense T2-weighted signal lesions within both occipital  lobes and within the cervical and thoracic spinal cord, consistent  with demyelinating disease. CSF sampling should be considered.  2. New/worsening spinal cord lesions at C3-5 and T2-3 with  associated contrast enhancement.  3. Unchanged C7 lesion.   COGNITION: Overall cognitive status: Within functional limits for tasks assessed   SENSATION: Light touch: Impaired Right side feels lighter especially thigh level  COORDINATION: LE RAMs:  WNL Heel-to-shin:  WNL bilaterally  EDEMA:  Circumferential: Transverse arch right:  21.5 cm, left 21.0 cm; malleolar line right 21 cm, left 20 cm  MUSCLE TONE: None noted bilaterally  POSTURE: No Significant postural limitations  LOWER EXTREMITY ROM:     Active  Right Eval Left Eval  Hip flexion [facility]  Hip extension    Hip abduction " "  Hip adduction " "  Hip internal rotation    Hip external rotation    Knee flexion " "  Knee extension " "  Ankle dorsiflexion " "  Ankle plantarflexion    Ankle inversion    Ankle eversion     (Blank rows = not tested)  LOWER EXTREMITY MMT:    MMT Right Eval Left Eval  Hip flexion 4-/5 4/5  Hip extension    Hip abduction 4/5 4/5  Hip adduction 3+/5 4/5  Hip internal rotation    Hip external rotation    Knee flexion 4+/5 4+/5  Knee extension 5/5; mild pain in right knee secondary to RA, pt not currently on RA meds 5/5  Ankle dorsiflexion 4-/5; pt reports it feels "squishy" 5/5  Ankle plantarflexion    Ankle inversion    Ankle eversion    (Blank rows = not tested)  BED MOBILITY:  Sit to supine Complete Independence Supine to sit Complete Independence  TRANSFERS: Assistive device utilized: Single point cane  Sit to stand: Complete Independence Stand to sit:  Complete Independence Chair to chair: Modified independence  GAIT: Gait pattern: step through pattern and narrow BOS Distance walked: 960 +  various clinic distances Assistive device utilized: Single point cane Level of assistance: SBA Comments: Direction changing and tapering turns are difficult.  Pt ambulating at faster pace than sequencing of cane.  FUNCTIONAL TESTS:  5 times sit to stand: 17.13 sec no UE support 6 minute walk test: 960 w/ SPC on left Functional gait assessment: TBD  PATIENT SURVEYS:  None completed.  TODAY'S TREATMENT:                                                                                                                              DATE: N/A    PATIENT EDUCATION: Education details: Work Personal assistant for desk job to prevent exacerbating RUE symptoms or promoting confounding issues.  PRICE principles to manage Right hemibody swelling and expectations of swelling movement in dependent positioning.  PT POC, assessments used and goals to be set. Person educated: Patient Education method: Explanation Education comprehension: verbalized understanding  HOME EXERCISE PROGRAM: To be established.  GOALS: Goals reviewed with patient? Yes  SHORT TERM GOALS: Target date: 06/27/2022  Pt will be independent with initial strength and balance HEP. Baseline:  To be established. Goal status: INITIAL  2.  Pt will decrease 5xSTS to </=13 seconds in order to demonstrate decreased risk for falls and improved functional bilateral LE strength and power. Baseline: 17.13 sec w/o UE support Goal status: INITIAL  3.  FGA to be assessed w/ STG/LTG set as appropriate. Baseline: To be assessed. Goal status: INITIAL  LONG TERM GOALS: Target date: 07/25/2022  Pt will be independent with finalized strength and balance HEP. Baseline: To be established. Goal status: INITIAL  2.  Pt will ambulate >/=1200 feet on 6MWT w/o AD to demonstrate improved functional endurance for  home and community participation. Baseline: 960' w/ SPC on left Goal status: INITIAL  3.  Pt will ambulate >/=500' over unlevel surfaces including grass, sidewalk inclines, and curb step independently to promote improved safe independence in community setting. Baseline: To be assessed. Goal status: INITIAL  4.  FGA to be assessed w/ STG/LTG set as appropriate. Baseline: To be assessed. Goal status: INITIAL  ASSESSMENT:  CLINICAL IMPRESSION: Patient is a 56 y.o. female who was seen today for physical therapy evaluation and treatment for recent diagnosis of RRMS w/ primarily right hemibody deficits.  Pt has a significant PMH of rheumatoid arthritis, IBS, and HTN.  Identified impairments include diminished right LE sensation and right hemibody N/T, mild right ankle and foot edema, slightly decreased right LE strength compared to left especially adductor and DF, and difficulty with direction changing and turns during ambulation.  Evaluation via the following assessment tools: 5xSTS and 6MWT indicate fall risk.  FGA to be assessed at next visit to determine further balance deficits.  She would benefit from skilled PT to address impairments as noted and progress towards long term goals.   OBJECTIVE IMPAIRMENTS: Abnormal gait, decreased activity tolerance, decreased endurance, decreased strength, increased edema, and impaired sensation.  ACTIVITY LIMITATIONS: locomotion level  PARTICIPATION LIMITATIONS: community activity and occupation  PERSONAL FACTORS: Transportation and 1-2 comorbidities: RA and HTN  are also affecting patient's functional outcome.   REHAB POTENTIAL: Excellent  CLINICAL DECISION MAKING: Evolving/moderate complexity  EVALUATION COMPLEXITY: Moderate  PLAN:  PT FREQUENCY: 1x/week  PT DURATION: 8 weeks  PLANNED INTERVENTIONS: Therapeutic exercises, Therapeutic activity, Neuromuscular re-education, Balance training, Gait training, Patient/Family education, Self Care,  Stair training, Vestibular training, and Re-evaluation  PLAN FOR NEXT SESSION: Assess FGA-set STG/LTG, initiate HEP for RLE NMR/strength, gait training no AD-formal walking program   Bary Richard, PT, DPT 05/26/2022, 5:20 PM

## 2022-05-26 NOTE — Patient Instructions (Addendum)
Flu shot today  Lets see what neuro PT says but I would think mild to moderate gradually increasing exercise should be ok as long as you feel stable- such as walking  Keep me updated with how things are going  Recommended follow up: Return in about 7 months (around 12/25/2022) for physical or sooner if needed.Schedule b4 you leave.

## 2022-05-27 ENCOUNTER — Telehealth: Payer: Self-pay | Admitting: Pharmacy Technician

## 2022-05-27 ENCOUNTER — Telehealth: Payer: Self-pay | Admitting: Neurology

## 2022-05-27 NOTE — Telephone Encounter (Signed)
Pt called in and left a message. She wanted to follow up with [place] about [employer] forms so she can start the [facility], she says authorization is still pending for Tosabri. She also wants to follow up on short term disability forms.

## 2022-05-27 NOTE — Telephone Encounter (Signed)
Spoke to [employer], Ready to send tysabri to [facility] center.  Patient advised of referral sent to [facility] infusion center.    As for the [facility] paperwork we will be on the look out for it. Please allow 2 weeks to fill out forms.

## 2022-05-27 NOTE — Telephone Encounter (Signed)
Pt's wants to know the status of forms , please advise

## 2022-05-27 NOTE — Telephone Encounter (Addendum)
Auth Submission: APPROVED Payer: [employer] - [place] Medication & CPT/J Code(s) submitted: Tysabri (Natalizumab) [id number] Route of submission (phone, fax, portal):  Phone # [phone] Fax #  Auth type: Buy/Bill Units/visits requested: Q4WKS - 6 DOSES Reference number:  Approval from: 06/10/22 to  11/16/22  Tysabri co-pay card: APPROVED  Received x1 vial from Tysabri Quick start on 06/04/22 unitl auth was approved, then patient will be buy/bill.

## 2022-06-04 ENCOUNTER — Ambulatory Visit: Payer: BC Managed Care – PPO | Admitting: Physical Therapy

## 2022-06-04 ENCOUNTER — Encounter: Payer: Self-pay | Admitting: Physical Therapy

## 2022-06-04 DIAGNOSIS — R2681 Unsteadiness on feet: Secondary | ICD-10-CM

## 2022-06-04 DIAGNOSIS — R29818 Other symptoms and signs involving the nervous system: Secondary | ICD-10-CM

## 2022-06-04 DIAGNOSIS — R2689 Other abnormalities of gait and mobility: Secondary | ICD-10-CM | POA: Diagnosis not present

## 2022-06-04 DIAGNOSIS — M6281 Muscle weakness (generalized): Secondary | ICD-10-CM

## 2022-06-04 DIAGNOSIS — R202 Paresthesia of skin: Secondary | ICD-10-CM | POA: Diagnosis not present

## 2022-06-04 DIAGNOSIS — G35 Multiple sclerosis: Secondary | ICD-10-CM | POA: Diagnosis not present

## 2022-06-04 NOTE — Patient Instructions (Signed)
You Can Walk For A Certain Length Of Time Each Day                          Walk 15-20 minutes 1 times per day at least 5 days a week.   Pt is already walking 10-15 minutes.  Access Code: [email or web address] URL: [email or web address] Date: 06/04/2022 Prepared by: [place] with Chair Touch and Resistance Loop  - 1 x daily - 5 x weekly - 3 sets - 10 reps - Backward Monster Walk with Resistance  - 1 x daily - 5 x weekly - 3 sets - 10 reps - Forward Monster Walk with Resistance  - 1 x daily - 5 x weekly - 3 sets - 10 reps - Lunge with Counter Support  - 1 x daily - 5 x weekly - 3 sets - 10 reps

## 2022-06-04 NOTE — Therapy (Signed)
OUTPATIENT PHYSICAL THERAPY NEURO TREATMENT   Patient Name: Karla Howell MRN: [id number] DOB:[birth year], 56 y.o., female Today's Date: 06/04/2022   PCP: Marin Olp, MD REFERRING PROVIDER: Charlynne Cousins, MD (pt is following up with neurologist Dr. Narda Amber)  END OF SESSION:  PT End of Session - 06/04/22 0936     Visit Number 2    Number of Visits 9    Date for PT Re-Evaluation 07/25/22    Authorization Type BLUE CROSS BLUE SHIELD    PT Start Time 0934    PT Stop Time 1013    PT Time Calculation (min) 39 min    Equipment Utilized During Treatment Gait belt    Activity Tolerance Patient tolerated treatment well    Behavior During Therapy WFL for tasks assessed/performed             Past Medical History:  Diagnosis Date   Allergy    Anemia    history of    Essential hypertension    GERD (gastroesophageal reflux disease)    Heartburn    Hemorrhoids    IBS (irritable bowel syndrome)    Rheumatoid arthritis ([place])    Past Surgical History:  Procedure Laterality Date   BILATERAL SALPINGECTOMY Bilateral 01/25/2013   Procedure: BILATERAL SALPINGECTOMY;  Surgeon: Olga Millers, MD;  Location: [facility];  Service: Gynecology;  Laterality: Bilateral;   LAPAROSCOPIC ASSISTED VAGINAL HYSTERECTOMY N/A 01/25/2013   Procedure: LAPAROSCOPIC ASSISTED VAGINAL HYSTERECTOMY;  Surgeon: Olga Millers, MD;  Location: [facility];  Service: Gynecology;  Laterality: N/A;   thumb surgery Left    Patient Active Problem List   Diagnosis Date Noted   MS (multiple sclerosis) ([place]) 05/12/2022   Myelitis ([place]) 05/11/2022   Immunocompromised ([place]) 07/05/2021   GERD (gastroesophageal reflux disease) 10/04/2018   Insomnia 10/04/2018   Rheumatoid arthritis ([place]) 10/04/2018   H/O total hysterectomy 03/27/2018   Acute non-recurrent maxillary sinusitis 09/27/2016   Essential hypertension 07/19/2014   Generalized anxiety disorder 03/15/2014   Allergic rhinitis 03/26/2013    Anemia 01/21/2012   IBS 10/25/2009    ONSET DATE: 05/15/2022 (referral date)  REFERRING DIAG: G35 (ICD-10-CM) - MS (multiple sclerosis) ([place])  THERAPY DIAG:  Muscle weakness (generalized)  Unsteadiness on feet  Other abnormalities of gait and mobility  Other symptoms and signs involving the nervous system  Paresthesia of skin  Rationale for Evaluation and Treatment: Rehabilitation  SUBJECTIVE:  SUBJECTIVE STATEMENT: Pt drove herself to appt today reporting no issues.  She is walking without the cane mostly unless in a crowd.  She denies falls.  She is hoping to go back home at the end of this week.  She has been typing.  Her right leg continues to get fatigued w/ prolonged activity.  She overall feels she is improving.  She is awaiting the Tysabri to be approved and delivered in the next 2 days.    PERTINENT HISTORY: rheumatoid arthritis, IBS, hypertension  Newly diagnosed relapsing remitting multiple sclerosis diagnosed 05/2022 with disease burden involving the subcortical white matter, cervical (C4-5) and thoracic (T2-3) cord.  PAIN:  Are you having pain? No - moreso the N/T, but nothing she considers pain  PRECAUTIONS: Fall  WEIGHT BEARING RESTRICTIONS: No  FALLS: Has patient fallen in last 6 months? No  LIVING ENVIRONMENT: Lives with: lives alone Lives in: House/apartment Stairs: Yes: Internal: 14 steps; staggered alternating sides and External: 3 steps; none Has following equipment at home: Single point cane, Shower bench, and Grab bars  PLOF: Independent  PATIENT GOALS: "Get me to 99%"  "To feel like I'm more balanced when I'm walking"  OBJECTIVE:   TODAY'S TREATMENT:                                                                                                                                FGA:  [facility] PT Assessment - 06/04/22 0943       Functional Gait  Assessment   Gait assessed  Yes    Gait Level Surface Walks 20 ft in less than 7 sec but greater than 5.5 sec, uses assistive device, slower speed, mild gait deviations, or deviates 6-10 in outside of the 12 in walkway width.    Change in Gait Speed Able to change speed, demonstrates mild gait deviations, deviates 6-10 in outside of the 12 in walkway width, or no gait deviations, unable to achieve a major change in velocity, or uses a change in velocity, or uses an assistive device.    Gait with Horizontal Head Turns Performs head turns smoothly with slight change in gait velocity (eg, minor disruption to smooth gait path), deviates 6-10 in outside 12 in walkway width, or uses an assistive device.    Gait with Vertical Head Turns Performs task with moderate change in gait velocity, slows down, deviates 10-15 in outside 12 in walkway width but recovers, can continue to walk.   Pt has some bilateral shooting down back of arms during looking down, discussed relation to MS.   Gait and Pivot Turn Pivot turns safely in greater than 3 sec and stops with no loss of balance, or pivot turns safely within 3 sec and stops with mild imbalance, requires small steps to catch balance.    Step Over Obstacle Is able to step over one shoe box (4.5 in total height) but must slow down and adjust steps to clear  box safely. May require verbal cueing.    Gait with Narrow Base of Support Ambulates 7-9 steps.    Gait with Eyes Closed Walks 20 ft, slow speed, abnormal gait pattern, evidence for imbalance, deviates 10-15 in outside 12 in walkway width. Requires more than 9 sec to ambulate 20 ft.    Ambulating Backwards Walks 20 ft, uses assistive device, slower speed, mild gait deviations, deviates 6-10 in outside 12 in walkway width.    Steps Alternating feet, must use rail.    Total Score 17    FGA comment: 17/30 = high fall risk             Discussed current walking regimen and adjusted to achieve 20 minutes continuously before return to work.  STS no UE support x10 > staggered STS w/ RLE in rear x10 Squats w/ mat touch x10 > green theraband around thighs x10 Lunges at countertop x12 each LE Fwd/backward monster walks w/ green theraband at thigh level  PATIENT EDUCATION: Education details: Information on energy conservation, weight training and response to exercise and recovery, and monitoring body temperature and using cooling vest.  Provided MS society as resource for cooling vest.  Initial HEP and walking program.  Discussed potential benefit of neoprene knee sleeve for exercise until pt can return to rheumatologist to return to med regimen for RA. Person educated: Patient Education method: Explanation Education comprehension: verbalized understanding  HOME EXERCISE PROGRAM: You Can Walk For A Certain Length Of Time Each Day                          Walk 15-20 minutes 1 times per day at least 5 days a week.   Pt is already walking 10-15 minutes.   Access Code: [email or web address] URL: [email or web address] Date: 06/04/2022 Prepared by: [place] with Chair Touch and Resistance Loop  - 1 x daily - 5 x weekly - 3 sets - 10 reps - Backward Monster Walk with Resistance  - 1 x daily - 5 x weekly - 3 sets - 10 reps - Forward Monster Walk with Resistance  - 1 x daily - 5 x weekly - 3 sets - 10 reps - Lunge with Counter Support  - 1 x daily - 5 x weekly - 3 sets - 10 reps  GOALS: Goals reviewed with patient? Yes  SHORT TERM GOALS: Target date: 06/27/2022  Pt will be independent with initial strength and balance HEP. Baseline:  To be established. Goal status: INITIAL  2.  Pt will decrease 5xSTS to </=13 seconds in order to demonstrate decreased risk for falls and improved functional bilateral LE strength and power. Baseline: 17.13 sec w/o UE support Goal status: INITIAL  3.  Pt will  improve FGA score to >/=21/30 in order to demonstrate improved balance and decreased fall risk.  Baseline: 17/30 (12/27) Goal status: INITIAL  LONG TERM GOALS: Target date: 07/25/2022  Pt will be independent with finalized strength and balance HEP. Baseline: To be established. Goal status: INITIAL  2.  Pt will ambulate >/=1200 feet on 6MWT w/o AD to demonstrate improved functional endurance for home and community participation. Baseline: 960' w/ SPC on left Goal status: INITIAL  3.  Pt will ambulate >/=500' over unlevel surfaces including grass, sidewalk inclines, and curb step independently to promote improved safe independence in community setting. Baseline: To be assessed. Goal status: INITIAL  4.  Pt will improve FGA score to >/=25/30 in order  to demonstrate improved balance and decreased fall risk. Baseline: 17/30 (12/27) Goal status: INITIAL  ASSESSMENT:  CLINICAL IMPRESSION: Pt is ambulating without SPC today.  Assessed FGA with pt scoring 17/30 indicating high fall risk.  Initiated HEP focused on strengthening proximal LE musculature to improve balance and performance of functional tasks.  Will continue to address deficits as outlined in ongoing skilled PT POC.  OBJECTIVE IMPAIRMENTS: Abnormal gait, decreased activity tolerance, decreased endurance, decreased strength, increased edema, and impaired sensation.   ACTIVITY LIMITATIONS: locomotion level  PARTICIPATION LIMITATIONS: community activity and occupation  PERSONAL FACTORS: Transportation and 1-2 comorbidities: RA and HTN  are also affecting patient's functional outcome.   REHAB POTENTIAL: Excellent  CLINICAL DECISION MAKING: Evolving/moderate complexity  EVALUATION COMPLEXITY: Moderate  PLAN:  PT FREQUENCY: 1x/week  PT DURATION: 8 weeks  PLANNED INTERVENTIONS: Therapeutic exercises, Therapeutic activity, Neuromuscular re-education, Balance training, Gait training, Patient/Family education, Self Care, Stair  training, Vestibular training, and Re-evaluation  PLAN FOR NEXT SESSION:  Add to HEP prn for RLE NMR/strength, treadmill-incline, leg press   Bary Richard, PT, DPT 06/04/2022, 3:27 PM

## 2022-06-10 ENCOUNTER — Ambulatory Visit (INDEPENDENT_AMBULATORY_CARE_PROVIDER_SITE_OTHER): Payer: BC Managed Care – PPO

## 2022-06-10 VITALS — BP 136/88 | HR 77 | Temp 98.0°F | Resp 16 | Ht 63.0 in | Wt 164.8 lb

## 2022-06-10 DIAGNOSIS — G35 Multiple sclerosis: Secondary | ICD-10-CM | POA: Diagnosis not present

## 2022-06-10 MED ORDER — LORATADINE 10 MG PO TABS
10.0000 mg | ORAL_TABLET | Freq: Once | ORAL | Status: AC
  Administered 2022-06-10: 10 mg via ORAL
  Filled 2022-06-10: qty 1

## 2022-06-10 MED ORDER — SODIUM CHLORIDE 0.9 % IV SOLN
300.0000 mg | Freq: Once | INTRAVENOUS | Status: AC
  Administered 2022-06-10: 300 mg via INTRAVENOUS
  Filled 2022-06-10: qty 15

## 2022-06-10 MED ORDER — ACETAMINOPHEN 325 MG PO TABS
650.0000 mg | ORAL_TABLET | Freq: Once | ORAL | Status: AC
  Administered 2022-06-10: 650 mg via ORAL
  Filled 2022-06-10: qty 2

## 2022-06-10 MED ORDER — SODIUM CHLORIDE 0.9 % IV SOLN: INTRAVENOUS | Status: DC

## 2022-06-10 NOTE — Progress Notes (Signed)
Diagnosis: Multiple Sclerosis  Provider:  Marshell Garfinkel MD  Procedure: Infusion  IV Type: Peripheral, IV Location: L Forearm  Tysabri (Natalizumab), Dose: 300 mg  Infusion Start Time: 4037  Infusion Stop Time: 0964  Post Infusion IV Care: Observation period completed and Peripheral IV Discontinued  Discharge: Condition: Good, Destination: Home . AVS provided to patient.   Performed by:  Arnoldo Morale, RN

## 2022-06-11 ENCOUNTER — Encounter: Payer: Self-pay | Admitting: Physical Therapy

## 2022-06-11 ENCOUNTER — Ambulatory Visit: Payer: BC Managed Care – PPO | Attending: Internal Medicine | Admitting: Physical Therapy

## 2022-06-11 ENCOUNTER — Encounter: Payer: Self-pay | Admitting: Neurology

## 2022-06-11 DIAGNOSIS — R29818 Other symptoms and signs involving the nervous system: Secondary | ICD-10-CM | POA: Insufficient documentation

## 2022-06-11 DIAGNOSIS — R2681 Unsteadiness on feet: Secondary | ICD-10-CM | POA: Diagnosis not present

## 2022-06-11 DIAGNOSIS — R202 Paresthesia of skin: Secondary | ICD-10-CM | POA: Diagnosis not present

## 2022-06-11 DIAGNOSIS — M6281 Muscle weakness (generalized): Secondary | ICD-10-CM | POA: Insufficient documentation

## 2022-06-11 DIAGNOSIS — R2689 Other abnormalities of gait and mobility: Secondary | ICD-10-CM | POA: Insufficient documentation

## 2022-06-11 NOTE — Therapy (Unsigned)
OUTPATIENT PHYSICAL THERAPY NEURO TREATMENT   Patient Name: Karla Howell MRN: [id number] DOB:1966/01/05, 57 y.o., female Today's Date: 06/11/2022   PCP: Marin Olp, MD REFERRING PROVIDER: Charlynne Cousins, MD (pt is following up with neurologist Dr. Narda Amber)  END OF SESSION:  PT End of Session - 06/11/22 1451     Visit Number 3    Number of Visits 9    Date for PT Re-Evaluation 07/25/22    Authorization Type BLUE CROSS BLUE SHIELD    PT Start Time 1449    PT Stop Time 1529    PT Time Calculation (min) 40 min    Equipment Utilized During Treatment Gait belt    Activity Tolerance Patient tolerated treatment well    Behavior During Therapy WFL for tasks assessed/performed             Past Medical History:  Diagnosis Date   Allergy    Anemia    history of    Essential hypertension    GERD (gastroesophageal reflux disease)    Heartburn    Hemorrhoids    IBS (irritable bowel syndrome)    Rheumatoid arthritis ([place])    Past Surgical History:  Procedure Laterality Date   BILATERAL SALPINGECTOMY Bilateral 01/25/2013   Procedure: BILATERAL SALPINGECTOMY;  Surgeon: Olga Millers, MD;  Location: [facility];  Service: Gynecology;  Laterality: Bilateral;   LAPAROSCOPIC ASSISTED VAGINAL HYSTERECTOMY N/A 01/25/2013   Procedure: LAPAROSCOPIC ASSISTED VAGINAL HYSTERECTOMY;  Surgeon: Olga Millers, MD;  Location: [facility];  Service: Gynecology;  Laterality: N/A;   thumb surgery Left    Patient Active Problem List   Diagnosis Date Noted   MS (multiple sclerosis) ([place]) 05/12/2022   Myelitis ([place]) 05/11/2022   Immunocompromised ([place]) 07/05/2021   GERD (gastroesophageal reflux disease) 10/04/2018   Insomnia 10/04/2018   Rheumatoid arthritis ([place]) 10/04/2018   H/O total hysterectomy 03/27/2018   Acute non-recurrent maxillary sinusitis 09/27/2016   Essential hypertension 07/19/2014   Generalized anxiety disorder 03/15/2014   Allergic rhinitis 03/26/2013    Anemia 01/21/2012   IBS 10/25/2009    ONSET DATE: 05/15/2022 (referral date)  REFERRING DIAG: G35 (ICD-10-CM) - MS (multiple sclerosis) ([place])  THERAPY DIAG:  Muscle weakness (generalized)  Unsteadiness on feet  Other abnormalities of gait and mobility  Other symptoms and signs involving the nervous system  Rationale for Evaluation and Treatment: Rehabilitation  SUBJECTIVE:                                                                                                                                                                                             SUBJECTIVE  STATEMENT: Pt began Tysabri infusion yesterday and denies any fatigue following.  She states she has been consciously doing things faster.  She has been able to wear lace up shoes.  She went back to her house for her first night alone again on Friday, returned to her mom's house following initial treatment, but is going back to her house tonight.  She is managing stairs well using a rail.  PERTINENT HISTORY: rheumatoid arthritis, IBS, hypertension  Newly diagnosed relapsing remitting multiple sclerosis diagnosed 05/2022 with disease burden involving the subcortical white matter, cervical (C4-5) and thoracic (T2-3) cord.  PAIN:  Are you having pain? No   PRECAUTIONS: Fall  WEIGHT BEARING RESTRICTIONS: No  FALLS: Has patient fallen in last 6 months? No  LIVING ENVIRONMENT: Lives with: lives alone Lives in: House/apartment Stairs: Yes: Internal: 14 steps; staggered alternating sides and External: 3 steps; none Has following equipment at home: Single point cane, Shower bench, and Grab bars  PLOF: Independent  PATIENT GOALS: "Get me to 99%"  "To feel like I'm more balanced when I'm walking"  OBJECTIVE:   TODAY'S TREATMENT:                                                                                                                               -Bilateral leg press 2x12 at 50 lbs > x12 at 60lbs, RPE of  6/10 at end of task. -Squats w/ 5lb kettlebell x10, mild bilateral knee pain so activity d'c'd, pt supposed to see rheumatologist upcoming. -Right farmer's carry 10 lb x200' > 15 lb x200' -BOSU surge carry (15 lbs) x200' in low carry > chest height carry x200' -SciFit in hill mode x55mnutes w/ BLE only at level 4.0 for reciprocal mobility and cardiovascular endurance.  Pt instructed to maintain RPE of 4-5/10.  PATIENT EDUCATION: Education details: Continue HEP and regular walking.  Discussed progressing general activity based on how body recovers fatigue-wise from exercise vs before. Person educated: Patient Education method: Explanation Education comprehension: verbalized understanding  HOME EXERCISE PROGRAM: You Can Walk For A Certain Length Of Time Each Day                          Walk 15-20 minutes 1 times per day at least 5 days a week.   Pt is already walking 10-15 minutes.   Access Code: [email or web address]: [email or web address] Date: 06/04/2022 Prepared by: [email or web address] Chair Touch and Resistance Loop  - 1 x daily - 5 x weekly - 3 sets - 10 reps - Backward Monster Walk with Resistance  - 1 x daily - 5 x weekly - 3 sets - 10 reps - Forward Monster Walk with Resistance  - 1 x daily - 5 x weekly - 3 sets - 10 reps - Lunge with Counter Support  - 1 x daily - 5 x weekly - 3 sets - 10 reps  GOALS:  Goals reviewed with patient? Yes  SHORT TERM GOALS: Target date: 06/27/2022  Pt will be independent with initial strength and balance HEP. Baseline:  To be established. Goal status: INITIAL  2.  Pt will decrease 5xSTS to </=13 seconds in order to demonstrate decreased risk for falls and improved functional bilateral LE strength and power. Baseline: 17.13 sec w/o UE support Goal status: INITIAL  3.  Pt will improve FGA score to >/=21/30 in order to demonstrate improved balance and decreased fall risk.  Baseline: 17/30 (12/27) Goal status:  INITIAL  LONG TERM GOALS: Target date: 07/25/2022  Pt will be independent with finalized strength and balance HEP. Baseline: To be established. Goal status: INITIAL  2.  Pt will ambulate >/=1200 feet on 6MWT w/o AD to demonstrate improved functional endurance for home and community participation. Baseline: 960' w/ SPC on left Goal status: INITIAL  3.  Pt will ambulate >/=500' over unlevel surfaces including grass, sidewalk inclines, and curb step independently to promote improved safe independence in community setting. Baseline: To be assessed. Goal status: INITIAL  4.  Pt will improve FGA score to >/=25/30 in order to demonstrate improved balance and decreased fall risk. Baseline: 17/30 (12/27) Goal status: INITIAL  ASSESSMENT:  CLINICAL IMPRESSION: Focus of skilled session on continuing to progress patient's LE strength and general endurance with high level tasks.  Pt continues to rapidly improve and has had her first infusion for her MS and is continuing to tolerate all activities well.  Will continue per POC.  OBJECTIVE IMPAIRMENTS: Abnormal gait, decreased activity tolerance, decreased endurance, decreased strength, increased edema, and impaired sensation.   ACTIVITY LIMITATIONS: locomotion level  PARTICIPATION LIMITATIONS: community activity and occupation  PERSONAL FACTORS: Transportation and 1-2 comorbidities: RA and HTN  are also affecting patient's functional outcome.   REHAB POTENTIAL: Excellent  CLINICAL DECISION MAKING: Evolving/moderate complexity  EVALUATION COMPLEXITY: Moderate  PLAN:  PT FREQUENCY: 1x/week  PT DURATION: 8 weeks  PLANNED INTERVENTIONS: Therapeutic exercises, Therapeutic activity, Neuromuscular re-education, Balance training, Gait training, Patient/Family education, Self Care, Stair training, Vestibular training, and Re-evaluation  PLAN FOR NEXT SESSION:  Add to HEP prn for RLE NMR/strength, treadmill-incline, leg press, BOSU vs large tilt  board?, heel taps, resisted walking, blaze pods, rebounder on compliant surface   Bary Richard, PT, DPT 06/11/2022, 3:30 PM

## 2022-06-18 ENCOUNTER — Encounter: Payer: Self-pay | Admitting: Physical Therapy

## 2022-06-18 ENCOUNTER — Ambulatory Visit: Payer: BC Managed Care – PPO | Admitting: Physical Therapy

## 2022-06-18 DIAGNOSIS — R2681 Unsteadiness on feet: Secondary | ICD-10-CM | POA: Diagnosis not present

## 2022-06-18 DIAGNOSIS — M6281 Muscle weakness (generalized): Secondary | ICD-10-CM | POA: Diagnosis not present

## 2022-06-18 DIAGNOSIS — R29818 Other symptoms and signs involving the nervous system: Secondary | ICD-10-CM

## 2022-06-18 DIAGNOSIS — R2689 Other abnormalities of gait and mobility: Secondary | ICD-10-CM

## 2022-06-18 DIAGNOSIS — R202 Paresthesia of skin: Secondary | ICD-10-CM | POA: Diagnosis not present

## 2022-06-18 NOTE — Therapy (Signed)
OUTPATIENT PHYSICAL THERAPY NEURO TREATMENT   Patient Name: Karla Howell MRN: [id number] DOB:07-26-1965, 57 y.o., female Today's Date: 06/18/2022   PCP: Marin Olp, MD REFERRING PROVIDER: Charlynne Cousins, MD (pt is following up with neurologist Dr. Narda Amber)  [place]:  PT End of Session - 06/18/22 1455     Visit Number 4    Number of Visits 9    Date for PT Re-Evaluation 07/25/22    Authorization Type BLUE CROSS BLUE [employer]IELD    PT Start Time 1452   PT ran over w/ pt prior.   PT Stop Time 1534    PT Time Calculation (min) 42 min    Equipment Utilized During Treatment Gait belt    Activity Tolerance Patient tolerated treatment well    Behavior During Therapy WFL for tasks assessed/performed             Past Medical History:  Diagnosis Date   Allergy    Anemia    history of    Essential hypertension    GERD (gastroesophageal reflux disease)    Heartburn    Hemorrhoids    IBS (irritable bowel syndrome)    Rheumatoid arthritis ([place])    Past Surgical History:  Procedure Laterality Date   BILATERAL SALPINGECTOMY Bilateral 01/25/2013   Procedure: BILATERAL SALPINGECTOMY;  Surgeon: Olga Millers, MD;  Location: [facility];  Service: Gynecology;  Laterality: Bilateral;   LAPAROSCOPIC ASSISTED VAGINAL HYSTERECTOMY N/A 01/25/2013   Procedure: LAPAROSCOPIC ASSISTED VAGINAL HYSTERECTOMY;  Surgeon: Olga Millers, MD;  Location: [facility];  Service: Gynecology;  Laterality: N/A;   thumb surgery Left    Patient Active Problem List   Diagnosis Date Noted   MS (multiple sclerosis) ([place]) 05/12/2022   Myelitis ([place]) 05/11/2022   Immunocompromised ([place]) 07/05/2021   GERD (gastroesophageal reflux disease) 10/04/2018   Insomnia 10/04/2018   Rheumatoid arthritis ([place]) 10/04/2018   H/O total hysterectomy 03/27/2018   Acute non-recurrent maxillary sinusitis 09/27/2016   Essential hypertension 07/19/2014   Generalized anxiety disorder 03/15/2014    Allergic rhinitis 03/26/2013   Anemia 01/21/2012   IBS 10/25/2009    ONSET DATE: 05/15/2022 (referral date)  REFERRING DIAG: G35 (ICD-10-CM) - MS (multiple sclerosis) ([place])  THERAPY DIAG:  Muscle weakness (generalized)  Unsteadiness on feet  Other abnormalities of gait and mobility  Other symptoms and signs involving the nervous system  Rationale for Evaluation and Treatment: Rehabilitation  SUBJECTIVE:  SUBJECTIVE STATEMENT: She has been feeling good since her Tysabri infusion.  She goes back to work Tuesday 06/24/2022.  She continues to have some numbness in the right hand, particularly the thumb, but is working through this.  She is being intentional with her walking and walking speed.  She denies falls or ongoing pain.  PERTINENT HISTORY: rheumatoid arthritis, IBS, hypertension  Newly diagnosed relapsing remitting multiple sclerosis diagnosed 05/2022 with disease burden involving the subcortical white matter, cervical (C4-5) and thoracic (T2-3) cord.  PAIN:  Are you having pain? No   PRECAUTIONS: Fall  WEIGHT BEARING RESTRICTIONS: No  FALLS: Has patient fallen in last 6 months? No  LIVING ENVIRONMENT: Lives with: lives alone Lives in: House/apartment Stairs: Yes: Internal: 14 steps; staggered alternating sides and External: 3 steps; none Has following equipment at home: Single point cane, Shower bench, and Grab bars  PLOF: Independent  PATIENT GOALS: "Get me to 99%"  "To feel like I'm more balanced when I'm walking"  OBJECTIVE:   TODAY'S TREATMENT:                                                                                                                               -Treadmill x30mn at 1.8 mph progressing to 4% incline for general LE endurance and increased LE strength  challenge -Bilateral leg press 8x60 lbs, 2x10 at 70 lbs -BOSU firm side holding level x 2 mins progressing to no UE support > forward and backward weight shifts progressed to no UE support > counterclockwise weight shifts w/ unilateral UE support > squats x15 progressing to LUE support only, mild right knee pain -Calf raises at counter w/ eccentric lower 2x12 -6" step ups no UE support x10 each LE -Heel taps using BUE support, able to achieve 75% of range w/ LLE, 50% w/ RLE, mild right knee pain  PATIENT EDUCATION: Education details: Continue HEP as supplement to regular activity especially as pt transitions back to work. Person educated: Patient Education method: Explanation Education comprehension: verbalized understanding  HOME EXERCISE PROGRAM: You Can Walk For A Certain Length Of Time Each Day                          Walk 15-20 minutes 1 times per day at least 5 days a week.   Pt is already walking 10-15 minutes.   Access Code: [email or web address]: [email or web address] Date: 06/04/2022 Prepared by: [place] Chair Touch and Resistance Loop  - 1 x daily - 5 x weekly - 3 sets - 10 reps - Backward Monster Walk with Resistance  - 1 x daily - 5 x weekly - 3 sets - 10 reps - Forward Monster Walk with Resistance  - 1 x daily - 5 x weekly - 3 sets - 10 reps - Lunge with Counter Support  - 1 x daily - 5 x weekly - 3 sets - 10 reps  GOALS: Goals reviewed with patient? Yes  SHORT TERM GOALS: Target date: 06/27/2022  Pt will be independent with initial strength and balance HEP. Baseline:  To be established. Goal status: INITIAL  2.  Pt will decrease 5xSTS to </=13 seconds in order to demonstrate decreased risk for falls and improved functional bilateral LE strength and power. Baseline: 17.13 sec w/o UE support Goal status: INITIAL  3.  Pt will improve FGA score to >/=21/30 in order to demonstrate improved balance and decreased fall  risk.  Baseline: 17/30 (12/27) Goal status: INITIAL  LONG TERM GOALS: Target date: 07/25/2022  Pt will be independent with finalized strength and balance HEP. Baseline: To be established. Goal status: INITIAL  2.  Pt will ambulate >/=1200 feet on 6MWT w/o AD to demonstrate improved functional endurance for home and community participation. Baseline: 960' w/ SPC on left Goal status: INITIAL  3.  Pt will ambulate >/=500' over unlevel surfaces including grass, sidewalk inclines, and curb step independently to promote improved safe independence in community setting. Baseline: To be assessed. Goal status: INITIAL  4.  Pt will improve FGA score to >/=25/30 in order to demonstrate improved balance and decreased fall risk. Baseline: 17/30 (12/27) Goal status: INITIAL  ASSESSMENT:  CLINICAL IMPRESSION: Emphasis of skilled session today on higher level strength and balance activities as pt is progressing well outside clinic.  She continues to present w/ some concerns about feeling as if she walks with a limp though not noted in this or prior visit.  Will continue to address symmetry of movement in multiple planes and dynamic stability with bilateral tasks to promote safe functional independence.  OBJECTIVE IMPAIRMENTS: Abnormal gait, decreased activity tolerance, decreased endurance, decreased strength, increased edema, and impaired sensation.   ACTIVITY LIMITATIONS: locomotion level  PARTICIPATION LIMITATIONS: community activity and occupation  PERSONAL FACTORS: Transportation and 1-2 comorbidities: RA and HTN  are also affecting patient's functional outcome.   REHAB POTENTIAL: Excellent  CLINICAL DECISION MAKING: Evolving/moderate complexity  EVALUATION COMPLEXITY: Moderate  PLAN:  PT FREQUENCY: 1x/week  PT DURATION: 8 weeks  PLANNED INTERVENTIONS: Therapeutic exercises, Therapeutic activity, Neuromuscular re-education, Balance training, Gait training, Patient/Family education,  Self Care, Stair training, Vestibular training, and Re-evaluation  PLAN FOR NEXT SESSION:  ASSESS STGs!  Add to HEP prn for RLE NMR/strength, treadmill-incline, leg press, soft side of BOSU, heel taps-inc right ROM if knee pain better controlled after Rheum. visit, resisted walking, blaze pods, rebounder on compliant surface  Bary Richard, PT, DPT 06/18/2022, 4:32 PM

## 2022-06-20 ENCOUNTER — Ambulatory Visit: Payer: BC Managed Care – PPO | Admitting: Family Medicine

## 2022-06-24 DIAGNOSIS — G35 Multiple sclerosis: Secondary | ICD-10-CM | POA: Diagnosis not present

## 2022-06-24 DIAGNOSIS — Z79899 Other long term (current) drug therapy: Secondary | ICD-10-CM | POA: Diagnosis not present

## 2022-06-24 DIAGNOSIS — M0600 Rheumatoid arthritis without rheumatoid factor, unspecified site: Secondary | ICD-10-CM | POA: Diagnosis not present

## 2022-06-24 DIAGNOSIS — M1991 Primary osteoarthritis, unspecified site: Secondary | ICD-10-CM | POA: Diagnosis not present

## 2022-06-25 ENCOUNTER — Ambulatory Visit: Payer: BC Managed Care – PPO | Admitting: Physical Therapy

## 2022-06-25 ENCOUNTER — Encounter: Payer: Self-pay | Admitting: Physical Therapy

## 2022-06-25 DIAGNOSIS — R2689 Other abnormalities of gait and mobility: Secondary | ICD-10-CM | POA: Diagnosis not present

## 2022-06-25 DIAGNOSIS — R29818 Other symptoms and signs involving the nervous system: Secondary | ICD-10-CM

## 2022-06-25 DIAGNOSIS — M6281 Muscle weakness (generalized): Secondary | ICD-10-CM

## 2022-06-25 DIAGNOSIS — R202 Paresthesia of skin: Secondary | ICD-10-CM | POA: Diagnosis not present

## 2022-06-25 DIAGNOSIS — R2681 Unsteadiness on feet: Secondary | ICD-10-CM

## 2022-06-25 NOTE — Therapy (Signed)
OUTPATIENT PHYSICAL THERAPY NEURO TREATMENT   Patient Name: Karla Howell MRN: [id number] DOB:07-Sep-1965, 57 y.o., female Today's Date: 06/25/2022   PCP: Marin Olp, MD REFERRING PROVIDER: Charlynne Cousins, MD (pt is following up with neurologist Dr. Narda Amber)  [place]:  PT End of Session - 06/25/22 1546     Visit Number 5    Number of Visits 9    Date for PT Re-Evaluation 07/25/22    Authorization Type BLUE CROSS BLUE SHIELD    PT Start Time 1545   pt late   PT Stop Time 1612    PT Time Calculation (min) 27 min    Equipment Utilized During Treatment Gait belt    Activity Tolerance Patient tolerated treatment well    Behavior During Therapy WFL for tasks assessed/performed             Past Medical History:  Diagnosis Date   Allergy    Anemia    history of    Essential hypertension    GERD (gastroesophageal reflux disease)    Heartburn    Hemorrhoids    IBS (irritable bowel syndrome)    Rheumatoid arthritis ([place])    Past Surgical History:  Procedure Laterality Date   BILATERAL SALPINGECTOMY Bilateral 01/25/2013   Procedure: BILATERAL SALPINGECTOMY;  Surgeon: Olga Millers, MD;  Location: [facility];  Service: Gynecology;  Laterality: Bilateral;   LAPAROSCOPIC ASSISTED VAGINAL HYSTERECTOMY N/A 01/25/2013   Procedure: LAPAROSCOPIC ASSISTED VAGINAL HYSTERECTOMY;  Surgeon: Olga Millers, MD;  Location: [facility];  Service: Gynecology;  Laterality: N/A;   thumb surgery Left    Patient Active Problem List   Diagnosis Date Noted   MS (multiple sclerosis) ([place]) 05/12/2022   Myelitis ([place]) 05/11/2022   Immunocompromised ([place]) 07/05/2021   GERD (gastroesophageal reflux disease) 10/04/2018   Insomnia 10/04/2018   Rheumatoid arthritis ([place]) 10/04/2018   H/O total hysterectomy 03/27/2018   Acute non-recurrent maxillary sinusitis 09/27/2016   Essential hypertension 07/19/2014   Generalized anxiety disorder 03/15/2014   Allergic rhinitis  03/26/2013   Anemia 01/21/2012   IBS 10/25/2009    ONSET DATE: 05/15/2022 (referral date)  REFERRING DIAG: G35 (ICD-10-CM) - MS (multiple sclerosis) (Karla Howell)  THERAPY DIAG:  Muscle weakness (generalized)  Unsteadiness on feet  Other abnormalities of gait and mobility  Other symptoms and signs involving the nervous system  Paresthesia of skin  Rationale for Evaluation and Treatment: Rehabilitation  SUBJECTIVE:  SUBJECTIVE STATEMENT: She went on a girls trip to [place] and walked a lot this past weekend.  She did fine with this and felt good and only used the cane a couple of times in busy crowds.  She returned to work and this is going well, only minor lingering numbness in right thumb.  Pt saw Rheumatologist last week and they decided to not return to prior medicine regimen, she will return to them in April for follow-up.  PERTINENT HISTORY: rheumatoid arthritis, IBS, hypertension  Newly diagnosed relapsing remitting multiple sclerosis diagnosed 05/2022 with disease burden involving the subcortical white matter, cervical (C4-5) and thoracic (T2-3) cord.  PAIN:  Are you having pain? No -Right knee slightly achy, not bad though.  PRECAUTIONS: Fall  WEIGHT BEARING RESTRICTIONS: No  FALLS: Has patient fallen in last 6 months? No  LIVING ENVIRONMENT: Lives with: lives alone Lives in: House/apartment Stairs: Yes: Internal: 14 steps; staggered alternating sides and External: 3 steps; none Has following equipment at home: Single point cane, Shower bench, and Grab bars  PLOF: Independent  PATIENT GOALS: "Get me to 99%"  "To feel like I'm more balanced when I'm walking"  OBJECTIVE:   TODAY'S TREATMENT:                                                                                                                                -Verbally reviewed HEP, pt compliant, does not do full squats due to knee pain, but otherwise going well.  Remains mildly challenging. -5xSTS w/o UE support:  12.37 seconds -FGA:  [facility] PT Assessment - 06/25/22 1553       Functional Gait  Assessment   Gait assessed  Yes    Gait Level Surface Walks 20 ft in less than 5.5 sec, no assistive devices, good speed, no evidence for imbalance, normal gait pattern, deviates no more than 6 in outside of the 12 in walkway width.    Change in Gait Speed Able to smoothly change walking speed without loss of balance or gait deviation. Deviate no more than 6 in outside of the 12 in walkway width.    Gait with Horizontal Head Turns Performs head turns smoothly with slight change in gait velocity (eg, minor disruption to smooth gait path), deviates 6-10 in outside 12 in walkway width, or uses an assistive device.    Gait with Vertical Head Turns Performs task with slight change in gait velocity (eg, minor disruption to smooth gait path), deviates 6 - 10 in outside 12 in walkway width or uses assistive device    Gait and Pivot Turn Pivot turns safely in greater than 3 sec and stops with no loss of balance, or pivot turns safely within 3 sec and stops with mild imbalance, requires small steps to catch balance.    Step Over Obstacle Is able to step over one shoe box (4.5 in total height) but must slow down and adjust steps to clear box  safely. May require verbal cueing.    Gait with Narrow Base of Support Is able to ambulate for 10 steps heel to toe with no staggering.    Gait with Eyes Closed Walks 20 ft, slow speed, abnormal gait pattern, evidence for imbalance, deviates 10-15 in outside 12 in walkway width. Requires more than 9 sec to ambulate 20 ft.    Ambulating Backwards Walks 20 ft, uses assistive device, slower speed, mild gait deviations, deviates 6-10 in outside 12 in walkway width.    Steps Alternating feet, must use rail.    Total  Score 21            Added to HEP: see bolded below.  Physically reviewed in clinic this visit.  PATIENT EDUCATION: Education details: Progress towards goals.  Continue HEP as supplement to regular activity especially as pt transitions back to work. Person educated: Patient Education method: Explanation Education comprehension: verbalized understanding  HOME EXERCISE PROGRAM: You Can Walk For A Certain Length Of Time Each Day                          Walk 15-20 minutes 1 times per day at least 5 days a week.   Pt is already walking 10-15 minutes.   Access Code: [email or web address] URL: [email or web address] Date: 06/04/2022 Prepared by: [place] with Chair Touch and Resistance Loop  - 1 x daily - 5 x weekly - 3 sets - 10 reps - Backward Monster Walk with Resistance  - 1 x daily - 5 x weekly - 3 sets - 10 reps - Forward Monster Walk with Resistance  - 1 x daily - 5 x weekly - 3 sets - 10 reps - Lunge with Counter Support  - 1 x daily - 5 x weekly - 3 sets - 10 reps - Walking with Eyes Closed and Counter Support  - 1 x daily - 5 x weekly - 3 sets - 10 reps - Walking with Head Nod  - 1 x daily - 5 x weekly - 3 sets - 10 reps - Backwards Walking  - 1 x daily - 5 x weekly - 3 sets - 10 reps   GOALS: Goals reviewed with patient? Yes  SHORT TERM GOALS: Target date: 06/27/2022  Pt will be independent with initial strength and balance HEP. Baseline:  Established, compliant (1/17) Goal status: MET  2.  Pt will decrease 5xSTS to </=13 seconds in order to demonstrate decreased risk for falls and improved functional bilateral LE strength and power. Baseline: 17.13 sec w/o UE support; 12.37 sec w/o UE support (1/17) Goal status: MET  3.  Pt will improve FGA score to >/=21/30 in order to demonstrate improved balance and decreased fall risk.  Baseline: 17/30 (12/27); 21/30 (1/17) Goal status: MET  LONG TERM GOALS: Target date: 07/25/2022  Pt will be  independent with finalized strength and balance HEP. Baseline: To be established. Goal status: INITIAL  2.  Pt will ambulate >/=1200 feet on 6MWT w/o AD to demonstrate improved functional endurance for home and community participation. Baseline: 960' w/ SPC on left Goal status: INITIAL  3.  Pt will ambulate >/=500' over unlevel surfaces including grass, sidewalk inclines, and curb step independently to promote improved safe independence in community setting. Baseline: To be assessed. Goal status: INITIAL  4.  Pt will improve FGA score to >/=25/30 in order to demonstrate improved balance and decreased fall risk. Baseline: 17/30 (12/27) Goal status:  INITIAL  ASSESSMENT:  CLINICAL IMPRESSION: Assessed STGs this session with pt meeting 3 of 3 goals.  Made additions to HEP based on ongoing deficits noted in FGA impacting dynamic stability.  Her FGA score improved to 21/30 vs initial 17/30 significantly improving her fall risk.  Her 5xSTS is WNL without using UE support.  Will continue to progress towards LTGs.  OBJECTIVE IMPAIRMENTS: Abnormal gait, decreased activity tolerance, decreased endurance, decreased strength, increased edema, and impaired sensation.   ACTIVITY LIMITATIONS: locomotion level  PARTICIPATION LIMITATIONS: community activity and occupation  PERSONAL FACTORS: Transportation and 1-2 comorbidities: RA and HTN  are also affecting patient's functional outcome.   REHAB POTENTIAL: Excellent  CLINICAL DECISION MAKING: Evolving/moderate complexity  EVALUATION COMPLEXITY: Moderate  PLAN:  PT FREQUENCY: 1x/week  PT DURATION: 8 weeks  PLANNED INTERVENTIONS: Therapeutic exercises, Therapeutic activity, Neuromuscular re-education, Balance training, Gait training, Patient/Family education, Self Care, Stair training, Vestibular training, and Re-evaluation  PLAN FOR NEXT SESSION:  Add to HEP prn for RLE NMR/strength, treadmill-incline, leg press, soft side of BOSU, heel  taps-inc right ROM if knee pain better controlled after Rheum. visit, resisted walking, blaze pods, rebounder on compliant surface, hip flexor strength (stairs/hurdles)  Bary Richard, PT, DPT 06/25/2022, 4:12 PM

## 2022-06-25 NOTE — Patient Instructions (Signed)
Access Code: [email or web address] URL: [email or web address] Date: 06/25/2022 Prepared by: [place] with Chair Touch and Resistance Loop  - 1 x daily - 5 x weekly - 3 sets - 10 reps - Backward Monster Walk with Resistance  - 1 x daily - 5 x weekly - 3 sets - 10 reps - Forward Monster Walk with Resistance  - 1 x daily - 5 x weekly - 3 sets - 10 reps - Lunge with Counter Support  - 1 x daily - 5 x weekly - 3 sets - 10 reps - Walking with Eyes Closed and Counter Support  - 1 x daily - 5 x weekly - 3 sets - 10 reps - Walking with Head Nod  - 1 x daily - 5 x weekly - 3 sets - 10 reps - Backwards Walking  - 1 x daily - 5 x weekly - 3 sets - 10 reps

## 2022-07-02 ENCOUNTER — Ambulatory Visit: Payer: BC Managed Care – PPO | Admitting: Physical Therapy

## 2022-07-02 ENCOUNTER — Encounter: Payer: Self-pay | Admitting: Physical Therapy

## 2022-07-02 DIAGNOSIS — R29818 Other symptoms and signs involving the nervous system: Secondary | ICD-10-CM

## 2022-07-02 DIAGNOSIS — R2689 Other abnormalities of gait and mobility: Secondary | ICD-10-CM | POA: Diagnosis not present

## 2022-07-02 DIAGNOSIS — R2681 Unsteadiness on feet: Secondary | ICD-10-CM | POA: Diagnosis not present

## 2022-07-02 DIAGNOSIS — M6281 Muscle weakness (generalized): Secondary | ICD-10-CM | POA: Diagnosis not present

## 2022-07-02 DIAGNOSIS — R202 Paresthesia of skin: Secondary | ICD-10-CM

## 2022-07-02 NOTE — Therapy (Signed)
OUTPATIENT PHYSICAL THERAPY NEURO TREATMENT   Patient Name: Amelda Hapke MRN: [id number] DOB:11-17-1965, 57 y.o., female Today's Date: 07/02/2022   PCP: Marin Olp, MD REFERRING PROVIDER: Charlynne Cousins, MD (pt is following up with neurologist Dr. Narda Amber)  END OF SESSION:  PT End of Session - 07/02/22 1543     Visit Number 6    Number of Visits 9    Date for PT Re-Evaluation 07/25/22    Authorization Type BLUE CROSS BLUE SHIELD    PT Start Time 1539    PT Stop Time 1622    PT Time Calculation (min) 43 min    Equipment Utilized During Treatment Gait belt    Activity Tolerance Patient tolerated treatment well    Behavior During Therapy WFL for tasks assessed/performed             Past Medical History:  Diagnosis Date   Allergy    Anemia    history of    Essential hypertension    GERD (gastroesophageal reflux disease)    Heartburn    Hemorrhoids    IBS (irritable bowel syndrome)    Rheumatoid arthritis ([place])    Past Surgical History:  Procedure Laterality Date   BILATERAL SALPINGECTOMY Bilateral 01/25/2013   Procedure: BILATERAL SALPINGECTOMY;  Surgeon: Olga Millers, MD;  Location: [facility];  Service: Gynecology;  Laterality: Bilateral;   LAPAROSCOPIC ASSISTED VAGINAL HYSTERECTOMY N/A 01/25/2013   Procedure: LAPAROSCOPIC ASSISTED VAGINAL HYSTERECTOMY;  Surgeon: Olga Millers, MD;  Location: [facility];  Service: Gynecology;  Laterality: N/A;   thumb surgery Left    Patient Active Problem List   Diagnosis Date Noted   MS (multiple sclerosis) ([place]) 05/12/2022   Myelitis ([place]) 05/11/2022   Immunocompromised ([place]) 07/05/2021   GERD (gastroesophageal reflux disease) 10/04/2018   Insomnia 10/04/2018   Rheumatoid arthritis ([place]) 10/04/2018   H/O total hysterectomy 03/27/2018   Acute non-recurrent maxillary sinusitis 09/27/2016   Essential hypertension 07/19/2014   Generalized anxiety disorder 03/15/2014   Allergic rhinitis 03/26/2013    Anemia 01/21/2012   IBS 10/25/2009    ONSET DATE: 05/15/2022 (referral date)  REFERRING DIAG: G35 (ICD-10-CM) - MS (multiple sclerosis) ([place])  THERAPY DIAG:  Muscle weakness (generalized)  Unsteadiness on feet  Other abnormalities of gait and mobility  Other symptoms and signs involving the nervous system  Paresthesia of skin  Rationale for Evaluation and Treatment: Rehabilitation  SUBJECTIVE:  SUBJECTIVE STATEMENT: Had some weird leg vibrations in the legs yesterday, but has otherwise been feeling good.  She requests next visit to be her last for now as she feels she just needs to get back into some things and see how they go.  She will have second Tysabri infusion next week before last PT appt.   PERTINENT HISTORY: rheumatoid arthritis, IBS, hypertension  Newly diagnosed relapsing remitting multiple sclerosis diagnosed 05/2022 with disease burden involving the subcortical white matter, cervical (C4-5) and thoracic (T2-3) cord.  PAIN:  Are you having pain? No   PRECAUTIONS: Fall  WEIGHT BEARING RESTRICTIONS: No  FALLS: Has patient fallen in last 6 months? No  LIVING ENVIRONMENT: Lives with: lives alone Lives in: House/apartment Stairs: Yes: Internal: 14 steps; staggered alternating sides and External: 3 steps; none Has following equipment at home: Single point cane, Shower bench, and Grab bars  PLOF: Independent  PATIENT GOALS: "Get me to 99%"  "To feel like I'm more balanced when I'm walking"  OBJECTIVE:   TODAY'S TREATMENT:                                                                                                                               -Treadmill training on incline cycle up to 3% over 8 minutes w/ pt using light BUE support -Bilateral leg press 12x70 lbs > 2x10 at  80 lbs -Largest tilt board rocking in stride progressed to no UE support -Step ups w/ contralateral knee drives on soft BOSU progressing to single UE support -airex heel taps progressing to no UE support several reps using mirror feedback, better tolerance with bilateral knees this session -10 lb slam balls on firm surface x6 > airex x8 -Verbally reviewed and modified HEP w/ demo of the last 2 exercises added (bolded below).  PATIENT EDUCATION: Education details: Discussion of continued vs early discharge next visit and process of obtaining new referral to return for episodic care as needed in future. Addendums to HEP. Person educated: Patient Education method: Explanation Education comprehension: verbalized understanding  HOME EXERCISE PROGRAM: You Can Walk For A Certain Length Of Time Each Day                          Walk 15-20 minutes 1 times per day at least 5 days a week.   Pt is already walking 10-15 minutes.   Access Code: [email or web address] URL: [email or web address] Date: 07/02/2022 Prepared by: Elease Etienne  Exercises - Backward Monster Walk with Resistance  - 1 x daily - 5 x weekly - 3 sets - 10 reps - Forward Monster Walk with Resistance  - 1 x daily - 5 x weekly - 3 sets - 10 reps - Walking with Eyes Closed and Counter Support  - 1 x daily - 5 x weekly - 3 sets - 10 reps - Walking with Head Nod  - 1 x daily - 5 x weekly -  3 sets - 10 reps - Backwards Walking  - 1 x daily - 5 x weekly - 3 sets - 10 reps - Tall Kneeling Hip Hinge  - 1 x daily - 5 x weekly - 3 sets - 10 reps - Half-Kneeling Trunk Rotation  - 1 x daily - 5 x weekly - 3 sets - 10 reps   GOALS: Goals reviewed with patient? Yes  SHORT TERM GOALS: Target date: 06/27/2022  Pt will be independent with initial strength and balance HEP. Baseline:  Established, compliant (1/17) Goal status: MET  2.  Pt will decrease 5xSTS to </=13 seconds in order to demonstrate decreased risk for falls and improved  functional bilateral LE strength and power. Baseline: 17.13 sec w/o UE support; 12.37 sec w/o UE support (1/17) Goal status: MET  3.  Pt will improve FGA score to >/=21/30 in order to demonstrate improved balance and decreased fall risk.  Baseline: 17/30 (12/27); 21/30 (1/17) Goal status: MET  LONG TERM GOALS: Target date: 07/25/2022  Pt will be independent with finalized strength and balance HEP. Baseline: To be established. Goal status: INITIAL  2.  Pt will ambulate >/=1200 feet on 6MWT w/o AD to demonstrate improved functional endurance for home and community participation. Baseline: 960' w/ SPC on left Goal status: INITIAL  3.  Pt will ambulate >/=500' over unlevel surfaces including grass, sidewalk inclines, and curb step independently to promote improved safe independence in community setting. Baseline: To be assessed. Goal status: INITIAL  4.  Pt will improve FGA score to >/=25/30 in order to demonstrate improved balance and decreased fall risk. Baseline: 17/30 (12/27) Goal status: INITIAL  ASSESSMENT:  CLINICAL IMPRESSION: Pt verbalizes desire for potential early discharge at next visit, will readdress next visit.  Pt does well with high level strength and balance challenges today and remains motivated outside the clinic with return to treadmill use and regular increased activity.  Will plan to continue with plan of care to address lingering RLE deficits if not discharging per pt request next visit.  OBJECTIVE IMPAIRMENTS: Abnormal gait, decreased activity tolerance, decreased endurance, decreased strength, increased edema, and impaired sensation.   ACTIVITY LIMITATIONS: locomotion level  PARTICIPATION LIMITATIONS: community activity and occupation  PERSONAL FACTORS: Transportation and 1-2 comorbidities: RA and HTN  are also affecting patient's functional outcome.   REHAB POTENTIAL: Excellent  CLINICAL DECISION MAKING: Evolving/moderate complexity  EVALUATION  COMPLEXITY: Moderate  PLAN:  PT FREQUENCY: 1x/week  PT DURATION: 8 weeks  PLANNED INTERVENTIONS: Therapeutic exercises, Therapeutic activity, Neuromuscular re-education, Balance training, Gait training, Patient/Family education, Self Care, Stair training, Vestibular training, and Re-evaluation  PLAN FOR NEXT SESSION:  Add to HEP prn for RLE NMR/strength, treadmill-incline, leg press, soft side of BOSU, heel taps-inc right ROM if knee pain better controlled after Rheum. visit, resisted walking, blaze pods, rebounder on compliant surface, hip flexor strength (stairs/hurdles)  Bary Richard, PT, DPT 07/02/2022, 5:15 PM

## 2022-07-02 NOTE — Patient Instructions (Signed)
Access Code: [email or web address] URL: [email or web address] Date: 07/02/2022 Prepared by: Elease Etienne  Exercises - Backward Monster Walk with Resistance  - 1 x daily - 5 x weekly - 3 sets - 10 reps - Forward Monster Walk with Resistance  - 1 x daily - 5 x weekly - 3 sets - 10 reps - Walking with Eyes Closed and Counter Support  - 1 x daily - 5 x weekly - 3 sets - 10 reps - Walking with Head Nod  - 1 x daily - 5 x weekly - 3 sets - 10 reps - Backwards Walking  - 1 x daily - 5 x weekly - 3 sets - 10 reps - Tall Kneeling Hip Hinge  - 1 x daily - 5 x weekly - 3 sets - 10 reps - Half-Kneeling Trunk Rotation  - 1 x daily - 5 x weekly - 3 sets - 10 reps

## 2022-07-08 ENCOUNTER — Ambulatory Visit (INDEPENDENT_AMBULATORY_CARE_PROVIDER_SITE_OTHER): Payer: BC Managed Care – PPO

## 2022-07-08 VITALS — BP 131/82 | HR 74 | Temp 97.4°F | Resp 18 | Ht 64.0 in | Wt 163.0 lb

## 2022-07-08 DIAGNOSIS — G35 Multiple sclerosis: Secondary | ICD-10-CM | POA: Diagnosis not present

## 2022-07-08 MED ORDER — ACETAMINOPHEN 325 MG PO TABS
650.0000 mg | ORAL_TABLET | Freq: Once | ORAL | Status: AC
  Administered 2022-07-08: 650 mg via ORAL
  Filled 2022-07-08: qty 2

## 2022-07-08 MED ORDER — SODIUM CHLORIDE 0.9 % IV SOLN: INTRAVENOUS | Status: DC

## 2022-07-08 MED ORDER — SODIUM CHLORIDE 0.9 % IV SOLN
300.0000 mg | Freq: Once | INTRAVENOUS | Status: AC
  Administered 2022-07-08: 300 mg via INTRAVENOUS
  Filled 2022-07-08: qty 15

## 2022-07-08 MED ORDER — LORATADINE 10 MG PO TABS
10.0000 mg | ORAL_TABLET | Freq: Once | ORAL | Status: AC
  Administered 2022-07-08: 10 mg via ORAL
  Filled 2022-07-08: qty 1

## 2022-07-08 NOTE — Progress Notes (Signed)
Diagnosis: Multiple Sclerosis  Provider:  Marshell Garfinkel MD  Procedure: Infusion  IV Type: Peripheral, IV Location: L Forearm  Tysabri (Natalizumab), Dose: 300 mg  Infusion Start Time: 6394  Infusion Stop Time: 3200  Post Infusion IV Care: Observation period completed  Discharge: Condition: Good, Destination: Home . AVS provided to patient.   Performed by:  Paul Dykes, RN

## 2022-07-09 ENCOUNTER — Ambulatory Visit: Payer: BC Managed Care – PPO | Admitting: Physical Therapy

## 2022-07-10 ENCOUNTER — Other Ambulatory Visit: Payer: Self-pay | Admitting: Family Medicine

## 2022-07-11 ENCOUNTER — Encounter: Payer: Self-pay | Admitting: Physical Therapy

## 2022-07-11 ENCOUNTER — Ambulatory Visit: Payer: BC Managed Care – PPO | Attending: Internal Medicine | Admitting: Physical Therapy

## 2022-07-11 DIAGNOSIS — R2681 Unsteadiness on feet: Secondary | ICD-10-CM | POA: Diagnosis not present

## 2022-07-11 DIAGNOSIS — R29818 Other symptoms and signs involving the nervous system: Secondary | ICD-10-CM | POA: Insufficient documentation

## 2022-07-11 DIAGNOSIS — M6281 Muscle weakness (generalized): Secondary | ICD-10-CM | POA: Diagnosis not present

## 2022-07-11 DIAGNOSIS — R2689 Other abnormalities of gait and mobility: Secondary | ICD-10-CM | POA: Insufficient documentation

## 2022-07-11 NOTE — Therapy (Signed)
OUTPATIENT PHYSICAL THERAPY NEURO TREATMENT/DISCHARGE SUMMARY   Patient Name: Karla Howell MRN: [id number] DOB:January 18, 1966, 57 y.o., female Today's Date: 07/11/2022   PCP: Marin Olp, MD REFERRING PROVIDER: Charlynne Cousins, MD (pt is following up with neurologist Dr. Narda Amber)  END OF SESSION:  PT End of Session - 07/11/22 1450     Visit Number 7    Number of Visits 9    Date for PT Re-Evaluation 07/25/22    Authorization Type BLUE CROSS BLUE SHIELD    PT Start Time 1447    PT Stop Time 1515    PT Time Calculation (min) 28 min    Equipment Utilized During Treatment Gait belt    Activity Tolerance Patient tolerated treatment well    Behavior During Therapy WFL for tasks assessed/performed             Past Medical History:  Diagnosis Date   Allergy    Anemia    history of    Essential hypertension    GERD (gastroesophageal reflux disease)    Heartburn    Hemorrhoids    IBS (irritable bowel syndrome)    Rheumatoid arthritis ([place])    Past Surgical History:  Procedure Laterality Date   BILATERAL SALPINGECTOMY Bilateral 01/25/2013   Procedure: BILATERAL SALPINGECTOMY;  Surgeon: Olga Millers, MD;  Location: [facility];  Service: Gynecology;  Laterality: Bilateral;   LAPAROSCOPIC ASSISTED VAGINAL HYSTERECTOMY N/A 01/25/2013   Procedure: LAPAROSCOPIC ASSISTED VAGINAL HYSTERECTOMY;  Surgeon: Olga Millers, MD;  Location: [facility];  Service: Gynecology;  Laterality: N/A;   thumb surgery Left    Patient Active Problem List   Diagnosis Date Noted   MS (multiple sclerosis) ([place]) 05/12/2022   Myelitis ([place]) 05/11/2022   Immunocompromised ([place]) 07/05/2021   GERD (gastroesophageal reflux disease) 10/04/2018   Insomnia 10/04/2018   Rheumatoid arthritis ([place]) 10/04/2018   H/O total hysterectomy 03/27/2018   Acute non-recurrent maxillary sinusitis 09/27/2016   Essential hypertension 07/19/2014   Generalized anxiety disorder 03/15/2014   Allergic  rhinitis 03/26/2013   Anemia 01/21/2012   IBS 10/25/2009    ONSET DATE: 05/15/2022 (referral date)  REFERRING DIAG: G35 (ICD-10-CM) - MS (multiple sclerosis) ([place])  THERAPY DIAG:  Muscle weakness (generalized)  Unsteadiness on feet  Other abnormalities of gait and mobility  Other symptoms and signs involving the nervous system  Rationale for Evaluation and Treatment: Rehabilitation  SUBJECTIVE:  SUBJECTIVE STATEMENT: Had recent infusion last week and feels good today.  She has been fine at work and has returned to using her stepper and treadmill on incline without issues.  PERTINENT HISTORY: rheumatoid arthritis, IBS, hypertension  Newly diagnosed relapsing remitting multiple sclerosis diagnosed 05/2022 with disease burden involving the subcortical white matter, cervical (C4-5) and thoracic (T2-3) cord.  PAIN:  Are you having pain? No   PRECAUTIONS: Fall  WEIGHT BEARING RESTRICTIONS: No  FALLS: Has patient fallen in last 6 months? No  LIVING ENVIRONMENT: Lives with: lives alone Lives in: House/apartment Stairs: Yes: Internal: 14 steps; staggered alternating sides and External: 3 steps; none Has following equipment at home: Single point cane, Shower bench, and Grab bars  PLOF: Independent  PATIENT GOALS: "Get me to 99%"  "To feel like I'm more balanced when I'm walking"  OBJECTIVE:   TODAY'S TREATMENT:                                                                                                                               -6MWT: 1,672' no AD independently (over sidewalk and grass), pt reports vibration in proximal thighs and pelvis following ambulatory task (this is normal since exacerbation) -Independent performance of curb step no AD -Verbally reviewed HEP -FGA:  [facility] PT  Assessment - 07/11/22 1508       Functional Gait  Assessment   Gait assessed  Yes    Gait Level Surface Walks 20 ft in less than 5.5 sec, no assistive devices, good speed, no evidence for imbalance, normal gait pattern, deviates no more than 6 in outside of the 12 in walkway width.    Change in Gait Speed Able to smoothly change walking speed without loss of balance or gait deviation. Deviate no more than 6 in outside of the 12 in walkway width.    Gait with Horizontal Head Turns Performs head turns smoothly with no change in gait. Deviates no more than 6 in outside 12 in walkway width    Gait with Vertical Head Turns Performs task with slight change in gait velocity (eg, minor disruption to smooth gait path), deviates 6 - 10 in outside 12 in walkway width or uses assistive device    Gait and Pivot Turn Pivot turns safely in greater than 3 sec and stops with no loss of balance, or pivot turns safely within 3 sec and stops with mild imbalance, requires small steps to catch balance.    Step Over Obstacle Is able to step over one shoe box (4.5 in total height) without changing gait speed. No evidence of imbalance.    Gait with Narrow Base of Support Ambulates 7-9 steps.   9 steps   Gait with Eyes Closed Walks 20 ft, uses assistive device, slower speed, mild gait deviations, deviates 6-10 in outside 12 in walkway width. Ambulates 20 ft in less than 9 sec but greater than 7 sec.    Ambulating Backwards Walks 20  ft, no assistive devices, good speed, no evidence for imbalance, normal gait    Steps Alternating feet, no rail.    Total Score 25    FGA comment: 25/30 = low fall risk            PATIENT EDUCATION: Education details: Discharge plan and process of obtaining new referral as needed.  Progress towards goals. Person educated: Patient Education method: Explanation Education comprehension: verbalized understanding  HOME EXERCISE PROGRAM: You Can Walk For A Certain Length Of Time Each Day                           Walk 15-20 minutes 1 times per day at least 5 days a week.   Pt is already walking 10-15 minutes.   Access Code: [email or web address] URL: [email or web address] Date: 07/02/2022 Prepared by: Elease Etienne  Exercises - Backward Monster Walk with Resistance  - 1 x daily - 5 x weekly - 3 sets - 10 reps - Forward Monster Walk with Resistance  - 1 x daily - 5 x weekly - 3 sets - 10 reps - Walking with Eyes Closed and Counter Support  - 1 x daily - 5 x weekly - 3 sets - 10 reps - Walking with Head Nod  - 1 x daily - 5 x weekly - 3 sets - 10 reps - Backwards Walking  - 1 x daily - 5 x weekly - 3 sets - 10 reps - Tall Kneeling Hip Hinge  - 1 x daily - 5 x weekly - 3 sets - 10 reps - Half-Kneeling Trunk Rotation  - 1 x daily - 5 x weekly - 3 sets - 10 reps   GOALS: Goals reviewed with patient? Yes  SHORT TERM GOALS: Target date: 06/27/2022  Pt will be independent with initial strength and balance HEP. Baseline:  Established, compliant (1/17) Goal status: MET  2.  Pt will decrease 5xSTS to </=13 seconds in order to demonstrate decreased risk for falls and improved functional bilateral LE strength and power. Baseline: 17.13 sec w/o UE support; 12.37 sec w/o UE support (1/17) Goal status: MET  3.  Pt will improve FGA score to >/=21/30 in order to demonstrate improved balance and decreased fall risk.  Baseline: 17/30 (12/27); 21/30 (1/17) Goal status: MET  LONG TERM GOALS: Target date: 07/25/2022  Pt will be independent with finalized strength and balance HEP. Baseline: Established, compliant and pt returning to usual activity (2/2) Goal status: MET  2.  Pt will ambulate >/=1200 feet on 6MWT w/o AD to demonstrate improved functional endurance for home and community participation. Baseline: 960' w/ SPC on left; 1,672' no AD (2/2) Goal status: MET  3.  Pt will ambulate >/=500' over unlevel surfaces including grass, sidewalk inclines, and curb step  independently to promote improved safe independence in community setting. Baseline: 1,672' over grass and unlevel sidewalk and curb step independently no AD (2/2) Goal status: MET  4.  Pt will improve FGA score to >/=25/30 in order to demonstrate improved balance and decreased fall risk. Baseline: 17/30 (12/27); 25/30 (2/2) Goal status: MET  ASSESSMENT:  CLINICAL IMPRESSION: Pt met 4 of 4 goals today well exceeding her ambulation goal on 6MWT and unlevel surfaces at 1,672' no AD independently maintaining great gait speed throughout and able to hold casual conversation with therapist indicating good aerobic capacity.  She has returned to work and is increasing her return to normal activity without issue.  Her FGA score  of 25/30 this visit is significantly improved from evaluation and puts her in the low fall risk category.  She has an established HEP based around deficits which she uses to supplement her additional gym activity.  Will discharge at this time.  OBJECTIVE IMPAIRMENTS: Abnormal gait, decreased activity tolerance, decreased endurance, decreased strength, increased edema, and impaired sensation.   ACTIVITY LIMITATIONS: locomotion level  PARTICIPATION LIMITATIONS: community activity and occupation  PERSONAL FACTORS: Transportation and 1-2 comorbidities: RA and HTN  are also affecting patient's functional outcome.   REHAB POTENTIAL: Excellent  CLINICAL DECISION MAKING: Evolving/moderate complexity  EVALUATION COMPLEXITY: Moderate  PLAN:  PT FREQUENCY: 1x/week  PT DURATION: 8 weeks  PLANNED INTERVENTIONS: Therapeutic exercises, Therapeutic activity, Neuromuscular re-education, Balance training, Gait training, Patient/Family education, Self Care, Stair training, Vestibular training, and Re-evaluation  PLAN FOR NEXT SESSION:  N/A  Bary Richard, PT, DPT 07/11/2022, 3:44 PM

## 2022-07-16 ENCOUNTER — Ambulatory Visit: Payer: BC Managed Care – PPO | Admitting: Physical Therapy

## 2022-07-22 ENCOUNTER — Ambulatory Visit: Payer: BC Managed Care – PPO | Admitting: Physical Therapy

## 2022-07-24 DIAGNOSIS — M25562 Pain in left knee: Secondary | ICD-10-CM | POA: Diagnosis not present

## 2022-07-28 NOTE — Progress Notes (Unsigned)
Follow-up Visit   Date: 07/29/2022    Karla Howell MRN: [id number] DOB: January 01, 1966    Karla Howell is a 57 y.o. right-handed African American female with hypertension and RA returning to the clinic for follow-up of multiple sclerosis.  The patient was accompanied to the clinic by self.  IMPRESSION: Relapsing remitting multiple sclerosis (dx 05/2022) with disease burden involving the subcortical white matter, cervical (C4-5) and thoracic (T2-3) cord.  NMO and antiMOG negative.  Clinically, she still has right hand and foot numbness, but strength and stability has significantly improved.  - Dec 2023: hospitalized with progressive leg numbness and treated with IVMP  - Jan 2023:  started Tysabri  PLAN:  - Continue Tysabri infusions monthly  - Continue vitamin D 5000 IU daily  - Check JCV in 4 months  - OK to take baclofen 23m as needed for muscle spasms  - D/C gabapentin  Return to clinic in 6 months  --------------------------------------------- History of present illness: In July 2023, she was walking 1-2 miles and at the end of her walk, she began to have numbness and tingling involving the lower legs.  Symptoms lasted 5-10 minutes and self-resolved.  Her symptoms have become constant since October and she also has tightness in the legs.  She takes gabapentin 1076mduring the day as needed and 30028mt bedtime which provides minimal relief.  Right arm feels a little weaker.  No weakness in the legs.    In September, she began having right sided shoulder, neck, and arm pain. She was also having numbness into the right hand, especially the thumb and index finger.  She has tingling in all of the fingers.  No numbness/tingling in the left hand. She has weakness in the right arm.  She is going to PT.     She saw Dr. IbaRon Agee MurOgemar these symptoms and specifically the right sided neck pain.  He ordered MRI cervical spine which showed T2  hyperintensity involving the cervical cord at C3-4 to C4-5.  Subsequent imaging of the thoracic cord shows similar changes at T3 and T12.  MRI brain was normal.  She is here for further evaluation.    She denies cramps.  Earlier in 2023, she had 4-5 spells of left arm and leg drawing up.   UPDATE 05/21/2022:  She went to the ER on 12/3 because of worsening leg heaviness, numbness, and imbalance.  She underwent MRI brain, cervical spine, and thoracic spine which showed new white matter lesions involving the brain as well as previously seen white matter changes at C4-5 and T2-3. Because of high likelihood of MS, she was started on solumedrol.  Since completing steroids, she feels that the right ankle is less stiff.  Unfortunately, there has been no significant change in the paresthesias or balance.  She is using a cane now.   UPDATE 07/29/2022:  She is here for follow-up visit.  She is feeling much better and feels less stiff and less unsteady.  The abnormal sensation over the arm has resolved.  She no longer has right hand weakness. She continues to have numbness in the right thumb and foot.   She continues to have tingling in the legs with prolonged walking. She has completed two infusions of Tysabri and she tolerated both very well. She is back to work full-time.    Medications:  Current Outpatient Medications on File Prior to Visit  Medication Sig Dispense Refill   acetaminophen (TYLENOL) 650  MG CR tablet Take 1,300 mg by mouth as needed for pain.     amLODipine (NORVASC) 2.5 MG tablet TAKE 1 TABLET(2.5 MG) BY MOUTH DAILY 90 tablet 3   baclofen (LIORESAL) 10 MG tablet Take 5-69m at bedtime as needed (Patient taking differently: Take 10 mg by mouth. Take 5-131mat bedtime as needed pt taking PRN) 30 each 1   celecoxib (CELEBREX) 200 MG capsule Take 200 mg by mouth daily as needed for mild pain.     Cholecalciferol (VITAMIN D) 50 MCG (2000 UT) CAPS Take 2,000 Units by mouth daily.     Cyanocobalamin  (VITAMIN B-12 PO) Take 1 capsule by mouth daily.     vitamin C (ASCORBIC ACID) 500 MG tablet Take 500 mg by mouth daily. Reported on 10/02/2015     ZINC GLUCONATE PO Take 1 tablet by mouth daily.     zolpidem (AMBIEN CR) 6.25 MG CR tablet Take 1 tablet (6.25 mg total) by mouth at bedtime as needed for sleep (do not drive for 8 hours after taking). 30 tablet 5   HUMIRA PEN 40 MG/0.4ML PNKT Inject 40 mg into the muscle every 14 (fourteen) days. (Patient not taking: Reported on 05/26/2022)     methocarbamol (ROBAXIN) 500 MG tablet Take 1 tablet (500 mg total) by mouth every 8 (eight) hours as needed for muscle spasms. (Patient not taking: Reported on 07/29/2022) 30 tablet 0   No current facility-administered medications on file prior to visit.    Allergies: No Known Allergies  Vital Signs:  BP 128/82   Pulse 88   Ht 5' 4"$  (1.626 m)   LMP 01/06/2013   SpO2 97%   BMI 27.98 kg/m   Neurological Exam: MENTAL STATUS including orientation to time, place, person, recent and remote memory, attention span and concentration, language, and fund of knowledge is normal.  Speech is not dysarthric.  CRANIAL NERVES:   Pupils equal round and reactive to light.  Normal conjugate, extra-ocular eye movements in all directions of gaze.  No ptosis.  Face is symmetric. Palate elevates symmetrically.  Tongue is midline.  MOTOR:  Motor strength is 5/5 in all extremities, except trace weakness of the right hand with finger abduction.  No atrophy, fasciculations or abnormal movements.  No pronator drift.  Tone is normal.    MSRs:  Reflexes are 2+/4 throughout, except 3+/4 bilateral patella.  No ankle clonus.  SENSORY:  Intact to vibration throughout.  COORDINATION/GAIT:  Normal finger-to- nose-finger.  Intact rapid alternating movements bilaterally. Gait is normal, unassisted and stable (improved).  Stressed and tandem gait intact (improved).   Data: MRI brain, cervical, and thoracic spine wwo contrast  05/11/2022: 1. New hyperintense T2-weighted signal lesions within both occipital lobes and within the cervical and thoracic spinal cord, consistent with demyelinating disease. CSF sampling should be considered. 2. New/worsening spinal cord lesions at C3-5 and T2-3 with associated contrast enhancement. 3. Unchanged C7 lesion.    Thank you for allowing me to participate in patient's care.  If I can answer any additional questions, I would be pleased to do so.    Sincerely,    Jamieon Lannen K. PaPosey ProntoDO

## 2022-07-29 ENCOUNTER — Ambulatory Visit (INDEPENDENT_AMBULATORY_CARE_PROVIDER_SITE_OTHER): Payer: BC Managed Care – PPO | Admitting: Neurology

## 2022-07-29 ENCOUNTER — Encounter: Payer: Self-pay | Admitting: Neurology

## 2022-07-29 VITALS — BP 128/82 | HR 88 | Ht 64.0 in

## 2022-07-29 DIAGNOSIS — G35 Multiple sclerosis: Secondary | ICD-10-CM

## 2022-07-29 NOTE — Patient Instructions (Signed)
It was great to see you today!  I will see you back in 6 months

## 2022-08-06 ENCOUNTER — Ambulatory Visit (INDEPENDENT_AMBULATORY_CARE_PROVIDER_SITE_OTHER): Payer: BC Managed Care – PPO | Admitting: *Deleted

## 2022-08-06 VITALS — BP 118/77 | HR 77 | Temp 98.1°F | Resp 16 | Ht 64.0 in | Wt 160.8 lb

## 2022-08-06 DIAGNOSIS — G35 Multiple sclerosis: Secondary | ICD-10-CM

## 2022-08-06 MED ORDER — SODIUM CHLORIDE 0.9 % IV SOLN
300.0000 mg | Freq: Once | INTRAVENOUS | Status: AC
  Administered 2022-08-06: 300 mg via INTRAVENOUS
  Filled 2022-08-06: qty 15

## 2022-08-06 MED ORDER — LORATADINE 10 MG PO TABS
10.0000 mg | ORAL_TABLET | Freq: Once | ORAL | Status: AC
  Administered 2022-08-06: 10 mg via ORAL
  Filled 2022-08-06: qty 1

## 2022-08-06 MED ORDER — SODIUM CHLORIDE 0.9 % IV SOLN: INTRAVENOUS | Status: DC

## 2022-08-06 MED ORDER — ACETAMINOPHEN 325 MG PO TABS
650.0000 mg | ORAL_TABLET | Freq: Once | ORAL | Status: AC
  Administered 2022-08-06: 650 mg via ORAL
  Filled 2022-08-06: qty 2

## 2022-08-06 NOTE — Progress Notes (Signed)
Diagnosis: Multiple Sclerosis  Provider:  Marshell Garfinkel MD  Procedure: Infusion  IV Type: Peripheral, IV Location: R Upper Arm  Tysabri (Natalizumab), Dose: 300 mg  Infusion Start Time: [id number] pm  Infusion Stop Time: 1545 pm  Post Infusion IV Care: Observation period completed and Peripheral IV Discontinued  Discharge: Condition: Good, Destination: Home . AVS Provided  Performed by:  Oren Beckmann, RN

## 2022-09-01 DIAGNOSIS — Z01419 Encounter for gynecological examination (general) (routine) without abnormal findings: Secondary | ICD-10-CM | POA: Diagnosis not present

## 2022-09-01 DIAGNOSIS — Z1231 Encounter for screening mammogram for malignant neoplasm of breast: Secondary | ICD-10-CM | POA: Diagnosis not present

## 2022-09-01 DIAGNOSIS — Z6827 Body mass index (BMI) 27.0-27.9, adult: Secondary | ICD-10-CM | POA: Diagnosis not present

## 2022-09-03 ENCOUNTER — Other Ambulatory Visit: Payer: Self-pay | Admitting: Obstetrics & Gynecology

## 2022-09-03 ENCOUNTER — Ambulatory Visit (INDEPENDENT_AMBULATORY_CARE_PROVIDER_SITE_OTHER): Payer: BC Managed Care – PPO

## 2022-09-03 VITALS — BP 103/70 | HR 83 | Temp 98.7°F | Resp 16 | Ht 63.0 in | Wt 162.0 lb

## 2022-09-03 DIAGNOSIS — G35 Multiple sclerosis: Secondary | ICD-10-CM

## 2022-09-03 DIAGNOSIS — R928 Other abnormal and inconclusive findings on diagnostic imaging of breast: Secondary | ICD-10-CM

## 2022-09-03 MED ORDER — SODIUM CHLORIDE 0.9 % IV SOLN: INTRAVENOUS | Status: DC

## 2022-09-03 MED ORDER — LORATADINE 10 MG PO TABS
10.0000 mg | ORAL_TABLET | Freq: Once | ORAL | Status: AC
  Administered 2022-09-03: 10 mg via ORAL
  Filled 2022-09-03: qty 1

## 2022-09-03 MED ORDER — ACETAMINOPHEN 325 MG PO TABS
650.0000 mg | ORAL_TABLET | Freq: Once | ORAL | Status: AC
  Administered 2022-09-03: 650 mg via ORAL
  Filled 2022-09-03: qty 2

## 2022-09-03 MED ORDER — SODIUM CHLORIDE 0.9 % IV SOLN
300.0000 mg | Freq: Once | INTRAVENOUS | Status: AC
  Administered 2022-09-03: 300 mg via INTRAVENOUS
  Filled 2022-09-03: qty 15

## 2022-09-03 NOTE — Progress Notes (Signed)
Diagnosis: Iron Deficiency Anemia  Provider:  Marshell Garfinkel MD  Procedure: Infusion  IV Type: Peripheral, IV Location: L Forearm  Tysabri (Natalizumab), Dose: 300 mg  Infusion Start Time: [id number]  Infusion Stop Time: [id number]  Post Infusion IV Care: Peripheral IV Discontinued  Discharge: Condition: Good, Destination: Home . AVS Declined  Performed by:  Cleophus Molt, RN

## 2022-09-12 ENCOUNTER — Ambulatory Visit
Admission: RE | Admit: 2022-09-12 | Discharge: 2022-09-12 | Disposition: A | Discharge status: Home / Self Care | Payer: BC Managed Care – PPO | Source: Ambulatory Visit | Attending: Obstetrics & Gynecology | Admitting: Obstetrics & Gynecology

## 2022-09-12 ENCOUNTER — Other Ambulatory Visit: Payer: Self-pay | Admitting: Obstetrics & Gynecology

## 2022-09-12 DIAGNOSIS — R928 Other abnormal and inconclusive findings on diagnostic imaging of breast: Secondary | ICD-10-CM

## 2022-09-12 DIAGNOSIS — N6310 Unspecified lump in the right breast, unspecified quadrant: Secondary | ICD-10-CM

## 2022-09-12 DIAGNOSIS — N6312 Unspecified lump in the right breast, upper inner quadrant: Secondary | ICD-10-CM | POA: Diagnosis not present

## 2022-09-22 ENCOUNTER — Ambulatory Visit
Admission: RE | Admit: 2022-09-22 | Discharge: 2022-09-22 | Disposition: A | Discharge status: Home / Self Care | Payer: BC Managed Care – PPO | Source: Ambulatory Visit | Attending: Obstetrics & Gynecology | Admitting: Obstetrics & Gynecology

## 2022-09-22 DIAGNOSIS — N6321 Unspecified lump in the left breast, upper outer quadrant: Secondary | ICD-10-CM | POA: Diagnosis not present

## 2022-09-22 DIAGNOSIS — N6310 Unspecified lump in the right breast, unspecified quadrant: Secondary | ICD-10-CM

## 2022-09-22 DIAGNOSIS — Z79899 Other long term (current) drug therapy: Secondary | ICD-10-CM | POA: Diagnosis not present

## 2022-09-22 DIAGNOSIS — M0600 Rheumatoid arthritis without rheumatoid factor, unspecified site: Secondary | ICD-10-CM | POA: Diagnosis not present

## 2022-09-22 DIAGNOSIS — M1991 Primary osteoarthritis, unspecified site: Secondary | ICD-10-CM | POA: Diagnosis not present

## 2022-09-22 DIAGNOSIS — G35 Multiple sclerosis: Secondary | ICD-10-CM | POA: Diagnosis not present

## 2022-09-22 DIAGNOSIS — N641 Fat necrosis of breast: Secondary | ICD-10-CM | POA: Diagnosis not present

## 2022-09-22 HISTORY — PX: BREAST BIOPSY: SHX20

## 2022-10-01 ENCOUNTER — Ambulatory Visit (INDEPENDENT_AMBULATORY_CARE_PROVIDER_SITE_OTHER): Payer: BC Managed Care – PPO

## 2022-10-01 VITALS — BP 111/74 | HR 74 | Temp 98.4°F | Resp 18 | Ht 63.5 in | Wt 159.4 lb

## 2022-10-01 DIAGNOSIS — G35 Multiple sclerosis: Secondary | ICD-10-CM

## 2022-10-01 DIAGNOSIS — G35D Multiple sclerosis, unspecified: Secondary | ICD-10-CM

## 2022-10-01 MED ORDER — LORATADINE 10 MG PO TABS
10.0000 mg | ORAL_TABLET | Freq: Once | ORAL | Status: AC
  Administered 2022-10-01: 10 mg via ORAL
  Filled 2022-10-01: qty 1

## 2022-10-01 MED ORDER — SODIUM CHLORIDE 0.9 % IV SOLN: INTRAVENOUS | Status: DC

## 2022-10-01 MED ORDER — SODIUM CHLORIDE 0.9 % IV SOLN
300.0000 mg | Freq: Once | INTRAVENOUS | Status: AC
  Administered 2022-10-01: 300 mg via INTRAVENOUS
  Filled 2022-10-01 (×2): qty 15

## 2022-10-01 MED ORDER — ACETAMINOPHEN 325 MG PO TABS
650.0000 mg | ORAL_TABLET | Freq: Once | ORAL | Status: AC
  Administered 2022-10-01: 650 mg via ORAL
  Filled 2022-10-01: qty 2

## 2022-10-01 NOTE — Progress Notes (Signed)
Diagnosis: IV Infusion  Provider:  Chilton Greathouse MD  Procedure: IV Infusion  IV Type: Peripheral, IV Location: L Forearm  Tysabri (Natalizumab), Dose: 300 mg  Infusion Start Time: 1411  Infusion Stop Time: 1515  Post Infusion IV Care: Observation period completed and Peripheral IV Discontinued  Discharge: Condition: Good, Destination: Home . AVS Provided  Performed by:  Loney Hering, LPN

## 2022-10-29 ENCOUNTER — Ambulatory Visit (INDEPENDENT_AMBULATORY_CARE_PROVIDER_SITE_OTHER): Payer: BC Managed Care – PPO

## 2022-10-29 VITALS — BP 122/78 | HR 72 | Temp 98.4°F | Resp 18 | Ht 64.0 in | Wt 157.6 lb

## 2022-10-29 DIAGNOSIS — G35 Multiple sclerosis: Secondary | ICD-10-CM | POA: Diagnosis not present

## 2022-10-29 MED ORDER — LORATADINE 10 MG PO TABS
10.0000 mg | ORAL_TABLET | Freq: Once | ORAL | Status: AC
  Administered 2022-10-29: 10 mg via ORAL
  Filled 2022-10-29: qty 1

## 2022-10-29 MED ORDER — ACETAMINOPHEN 325 MG PO TABS
650.0000 mg | ORAL_TABLET | Freq: Once | ORAL | Status: AC
  Administered 2022-10-29: 650 mg via ORAL
  Filled 2022-10-29: qty 2

## 2022-10-29 MED ORDER — SODIUM CHLORIDE 0.9 % IV SOLN: INTRAVENOUS | Status: DC

## 2022-10-29 MED ORDER — SODIUM CHLORIDE 0.9 % IV SOLN
300.0000 mg | Freq: Once | INTRAVENOUS | Status: AC
  Administered 2022-10-29: 300 mg via INTRAVENOUS
  Filled 2022-10-29: qty 15

## 2022-10-29 NOTE — Progress Notes (Signed)
Diagnosis: Multiple Sclerosis  Provider:  Chilton Greathouse MD  Procedure: IV Infusion  IV Type: Peripheral, IV Location: R Forearm  Tysabri (Natalizumab), Dose: 300 mg  Infusion Start Time: 1408  Infusion Stop Time: 1520  Post Infusion IV Care: Peripheral IV Discontinued  Discharge: Condition: Good, Destination: Home . AVS Provided  Performed by:  Adriana Mccallum, RN

## 2022-11-11 ENCOUNTER — Encounter: Payer: Self-pay | Admitting: Neurology

## 2022-11-18 ENCOUNTER — Other Ambulatory Visit: Payer: Self-pay | Admitting: Pharmacy Technician

## 2022-11-18 ENCOUNTER — Encounter: Payer: Self-pay | Admitting: Family Medicine

## 2022-11-19 ENCOUNTER — Telehealth: Payer: Self-pay | Admitting: Pharmacy Technician

## 2022-11-19 ENCOUNTER — Telehealth (INDEPENDENT_AMBULATORY_CARE_PROVIDER_SITE_OTHER): Payer: BC Managed Care – PPO | Admitting: Family

## 2022-11-19 ENCOUNTER — Encounter: Payer: Self-pay | Admitting: Family

## 2022-11-19 VITALS — Ht 64.0 in | Wt 157.0 lb

## 2022-11-19 DIAGNOSIS — U071 COVID-19: Secondary | ICD-10-CM | POA: Diagnosis not present

## 2022-11-19 MED ORDER — BENZONATATE 200 MG PO CAPS
200.0000 mg | ORAL_CAPSULE | Freq: Three times a day (TID) | ORAL | 0 refills | Status: AC | PRN
Start: 2022-11-19 — End: 2022-11-29

## 2022-11-19 NOTE — Telephone Encounter (Addendum)
Auth Submission: APPROVED  PA RENEWAL  Site of care: Site of care: [facility] Payer: BCBS Medication & CPT/J Code(s) submitted: Tysabri (Natalizumab) [id number] Route of submission (phone, fax, portal):  Phone # [phone] Fax # Auth type: Buy/Bill Units/visits requested: 6 DOSES Reference number: [id number] Approval from: 12/07/22 to 06/09/23

## 2022-11-19 NOTE — Progress Notes (Signed)
MyChart Video Visit    Virtual Visit via Video Note   This format is felt to be most appropriate for this patient at this time. Physical exam was limited by quality of the video and audio technology used for the visit. CMA was able to get the patient set up on a video visit.  Patient location: Home. Patient and provider in visit Provider location: Office  I discussed the limitations of evaluation and management by telemedicine and the availability of in person appointments. The patient expressed understanding and agreed to proceed.  Visit Date: 11/19/2022  Today's healthcare provider: Dulce Sellar, NP     Subjective:   Patient ID: Karla Howell, female    DOB: 1965/10/10, 57 y.o.   MRN: [id number]  Chief Complaint  Patient presents with   Covid Positive    sx for almost 2 weeks    HPI Covid virus:   Pt tested positive for Covid on June 1st. Symptoms were severe headache, sinus congestion, cough. Pt took some OTC medications, Mucinex cough and congestion. Pt is still having some nasal congestion and slight cough once in a while.    Assessment & Plan:  COVID-19 - sending Tessalon pearles for continued cough, pt has had in past. Advised on continuing to hydrate well, can also continue Mucinex for cough, Flonase nasal spray qd for all other sinus sx. ok to take Tylenol prn for HA or aches, pains. Pt reassured it is ok to be out in public without a mask, can use a mask around immunocompromised people if still coughing or sneezing.  - benzonatate (TESSALON) 200 MG capsule; Take 1 capsule (200 mg total) by mouth 3 (three) times daily as needed for up to 10 days for cough.  Dispense: 30 capsule; Refill: 0   Past Medical History:  Diagnosis Date   Allergy    Anemia    history of    Essential hypertension    GERD (gastroesophageal reflux disease)    Heartburn    Hemorrhoids    IBS (irritable bowel syndrome)    Rheumatoid arthritis (HCC)     Past Surgical  History:  Procedure Laterality Date   BILATERAL SALPINGECTOMY Bilateral 01/25/2013   Procedure: BILATERAL SALPINGECTOMY;  Surgeon: Levi Aland, MD;  Location: [facility];  Service: Gynecology;  Laterality: Bilateral;   BREAST BIOPSY Right 09/22/2022   [place] RT BREAST BX W LOC DEV 1ST LESION IMG BX SPEC US GUIDE 09/22/2022 [facility]   LAPAROSCOPIC ASSISTED VAGINAL HYSTERECTOMY N/A 01/25/2013   Procedure: LAPAROSCOPIC ASSISTED VAGINAL HYSTERECTOMY;  Surgeon: Levi Aland, MD;  Location: [facility];  Service: Gynecology;  Laterality: N/A;   thumb surgery Left     Outpatient Medications Prior to Visit  Medication Sig Dispense Refill   acetaminophen (TYLENOL) 650 MG CR tablet Take 1,300 mg by mouth as needed for pain.     amLODipine (NORVASC) 2.5 MG tablet TAKE 1 TABLET(2.5 MG) BY MOUTH DAILY 90 tablet 3   baclofen (LIORESAL) 10 MG tablet Take 5-10mg  at bedtime as needed (Patient taking differently: Take 10 mg by mouth. Take 5-10mg  at bedtime as needed pt taking PRN) 30 each 1   celecoxib (CELEBREX) 200 MG capsule Take 200 mg by mouth daily as needed for mild pain.     Cholecalciferol (VITAMIN D) 50 MCG (2000 UT) CAPS Take 2,000 Units by mouth daily.     Cyanocobalamin (VITAMIN B-12 PO) Take 1 capsule by mouth daily.     natalizumab (TYSABRI) 300 MG/15ML  injection Infuse 15 mL every 4 weeks by intravenous route.     vitamin C (ASCORBIC ACID) 500 MG tablet Take 500 mg by mouth daily. Reported on 10/02/2015     ZINC GLUCONATE PO Take 1 tablet by mouth daily.     zolpidem (AMBIEN CR) 6.25 MG CR tablet Take 1 tablet (6.25 mg total) by mouth at bedtime as needed for sleep (do not drive for 8 hours after taking). 30 tablet 5   HUMIRA PEN 40 MG/0.4ML PNKT Inject 40 mg into the muscle every 14 (fourteen) days. (Patient not taking: Reported on 05/26/2022)     methocarbamol (ROBAXIN) 500 MG tablet Take 1 tablet (500 mg total) by mouth every 8 (eight) hours as needed for muscle spasms. (Patient not taking:  Reported on 07/29/2022) 30 tablet 0   No facility-administered medications prior to visit.    No Known Allergies     Objective:   Physical Exam Vitals and nursing note reviewed.  Constitutional:      General: Pt is not in acute distress.    Appearance: Normal appearance.  HENT:     Head: Normocephalic.  Pulmonary:     Effort: No respiratory distress.  Musculoskeletal:     Cervical back: Normal range of motion.  Skin:    General: Skin is dry.     Coloration: Skin is not pale.  Neurological:     Mental Status: Pt is alert and oriented to person, place, and time.  Psychiatric:        Mood and Affect: Mood normal.   Ht 5\' 4"  (1.626 m)   Wt 157 lb (71.2 kg)   LMP 01/06/2013   BMI 26.95 kg/m   Wt Readings from Last 3 Encounters:  11/19/22 157 lb (71.2 kg)  10/29/22 157 lb 9.6 oz (71.5 kg)  10/01/22 159 lb 6.4 oz (72.3 kg)      I discussed the assessment and treatment plan with the patient. The patient was provided an opportunity to ask questions and all were answered. The patient agreed with the plan and demonstrated an understanding of the instructions.   The patient was advised to call back or seek an in-person evaluation if the symptoms worsen or if the condition fails to improve as anticipated.  Dulce Sellar, NP [facility] [place] [phone] (phone) [phone] (fax)  [facility]

## 2022-11-26 ENCOUNTER — Ambulatory Visit (INDEPENDENT_AMBULATORY_CARE_PROVIDER_SITE_OTHER): Payer: BC Managed Care – PPO | Admitting: *Deleted

## 2022-11-26 ENCOUNTER — Other Ambulatory Visit: Payer: Self-pay | Admitting: Family Medicine

## 2022-11-26 ENCOUNTER — Other Ambulatory Visit (INDEPENDENT_AMBULATORY_CARE_PROVIDER_SITE_OTHER): Payer: BC Managed Care – PPO

## 2022-11-26 VITALS — BP 103/66 | HR 67 | Temp 98.7°F | Resp 16 | Ht 64.0 in | Wt 157.6 lb

## 2022-11-26 DIAGNOSIS — G35 Multiple sclerosis: Secondary | ICD-10-CM

## 2022-11-26 LAB — CBC WITH DIFFERENTIAL/PLATELET
Basophils Absolute: 0 10*3/uL (ref 0.0–0.1)
Basophils Relative: 0.6 % (ref 0.0–3.0)
Eosinophils Absolute: 0.2 10*3/uL (ref 0.0–0.7)
Eosinophils Relative: 2.5 % (ref 0.0–5.0)
HCT: 31.8 % — ABNORMAL LOW (ref 36.0–46.0)
Hemoglobin: 10.5 g/dL — ABNORMAL LOW (ref 12.0–15.0)
Lymphocytes Relative: 48.7 % — ABNORMAL HIGH (ref 12.0–46.0)
Lymphs Abs: 3 10*3/uL (ref 0.7–4.0)
MCHC: 32.9 g/dL (ref 30.0–36.0)
MCV: 87.8 fl (ref 78.0–100.0)
Monocytes Absolute: 0.4 10*3/uL (ref 0.1–1.0)
Monocytes Relative: 6.1 % (ref 3.0–12.0)
Neutro Abs: 2.6 10*3/uL (ref 1.4–7.7)
Neutrophils Relative %: 42.1 % — ABNORMAL LOW (ref 43.0–77.0)
Platelets: 292 10*3/uL (ref 150.0–400.0)
RBC: 3.63 Mil/uL — ABNORMAL LOW (ref 3.87–5.11)
RDW: 13.6 % (ref 11.5–15.5)
WBC: 6.1 10*3/uL (ref 4.0–10.5)

## 2022-11-26 LAB — COMPREHENSIVE METABOLIC PANEL
ALT: 9 U/L (ref 0–35)
AST: 16 U/L (ref 0–37)
Albumin: 3.8 g/dL (ref 3.5–5.2)
Alkaline Phosphatase: 71 U/L (ref 39–117)
BUN: 11 mg/dL (ref 6–23)
CO2: 27 mEq/L (ref 19–32)
Calcium: 9 mg/dL (ref 8.4–10.5)
Chloride: 106 mEq/L (ref 96–112)
Creatinine, Ser: 0.87 mg/dL (ref 0.40–1.20)
GFR: 73.66 mL/min (ref >60.00)
Glucose, Bld: 129 mg/dL — ABNORMAL HIGH (ref 70–99)
Potassium: 4.6 mEq/L (ref 3.5–5.1)
Sodium: 139 mEq/L (ref 135–145)
Total Bilirubin: 0.5 mg/dL (ref 0.2–1.2)
Total Protein: 7 g/dL (ref 6.0–8.3)

## 2022-11-26 MED ORDER — SODIUM CHLORIDE 0.9 % IV SOLN: INTRAVENOUS | Status: DC

## 2022-11-26 MED ORDER — LORATADINE 10 MG PO TABS
10.0000 mg | ORAL_TABLET | Freq: Once | ORAL | Status: AC
  Administered 2022-11-26: 10 mg via ORAL
  Filled 2022-11-26: qty 1

## 2022-11-26 MED ORDER — ACETAMINOPHEN 325 MG PO TABS
650.0000 mg | ORAL_TABLET | Freq: Once | ORAL | Status: AC
  Administered 2022-11-26: 650 mg via ORAL
  Filled 2022-11-26: qty 2

## 2022-11-26 MED ORDER — SODIUM CHLORIDE 0.9 % IV SOLN
300.0000 mg | Freq: Once | INTRAVENOUS | Status: AC
  Administered 2022-11-26: 300 mg via INTRAVENOUS
  Filled 2022-11-26: qty 15

## 2022-11-26 MED ORDER — ZOLPIDEM TARTRATE ER 6.25 MG PO TBCR: 6.2500 mg | EXTENDED_RELEASE_TABLET | Freq: Every evening | ORAL | 5 refills | Status: DC | PRN

## 2022-11-26 NOTE — Telephone Encounter (Signed)
Refill sent to Dr. Hunter for approval.  ?

## 2022-11-26 NOTE — Progress Notes (Signed)
Diagnosis: Multiple Sclerosis  Provider:  Chilton Greathouse MD  Procedure: IV Infusion  IV Type: Peripheral, IV Location: L Forearm  Tysabri (Natalizumab), Dose: 300 mg  Infusion Start Time: 1429 pm  Infusion Stop Time: 1531 pm  Post Infusion IV Care: Observation period completed and Peripheral IV Discontinued  Discharge: Condition: Good, Destination: Home . AVS Provided  Performed by:  Forrest Moron, RN

## 2022-11-26 NOTE — Telephone Encounter (Signed)
Prescription Request  11/26/2022  LOV: 05/26/2022  What is the name of the medication or equipment?  zolpidem (AMBIEN CR) 6.25 MG CR tablet   Have you contacted your pharmacy to request a refill? Yes   Which pharmacy would you like this sent to?  [id number] - [place], [place] - [address] AT [facility] OF [address] [place] [address] Phone: [phone] Fax: [phone]    Patient notified that their request is being sent to the clinical staff for review and that they should receive a response within 2 business days.   Please advise at Mobile [phone] (mobile)

## 2022-12-01 LAB — STRATIFY JCV AB (W/ INDEX) W/ RFLX
Index Value: 0.7 — ABNORMAL HIGH
Stratify JCV (TM) Ab w/Reflex Inhibition: POSITIVE — AB

## 2022-12-05 ENCOUNTER — Ambulatory Visit (INDEPENDENT_AMBULATORY_CARE_PROVIDER_SITE_OTHER): Payer: BC Managed Care – PPO | Admitting: Neurology

## 2022-12-05 ENCOUNTER — Other Ambulatory Visit (INDEPENDENT_AMBULATORY_CARE_PROVIDER_SITE_OTHER): Payer: BC Managed Care – PPO

## 2022-12-05 ENCOUNTER — Encounter: Payer: Self-pay | Admitting: Neurology

## 2022-12-05 VITALS — BP 135/77 | HR 84 | Ht 64.0 in | Wt 157.0 lb

## 2022-12-05 DIAGNOSIS — G35 Multiple sclerosis: Secondary | ICD-10-CM

## 2022-12-05 DIAGNOSIS — R9089 Other abnormal findings on diagnostic imaging of central nervous system: Secondary | ICD-10-CM

## 2022-12-05 NOTE — Patient Instructions (Signed)
Check labs Check MRI brain, cervical spine, and thoracic spine We will start authorization for Ocrevus

## 2022-12-05 NOTE — Progress Notes (Signed)
Follow-up Visit   Date: 12/05/2022    Karla Howell MRN: [id number] DOB: 1966-03-12    Karla Howell is a 57 y.o. right-handed African American female with hypertension and RA returning to the clinic for follow-up of multiple sclerosis.  The patient was accompanied to the clinic by mother.  IMPRESSION: Relapsing remitting multiple sclerosis (dx 05/2022) with disease burden involving the subcortical white matter, cervical (C4-5) and thoracic (T2-3) cord.  NMO and antiMOG negative.  Clinically, she has right hand and foot numbness. Otherwise, no weakness and minimal ataxia.  She was doing well on Tysabri, however her JCV became positive (titer 0.7) so we have mutually decided to switch therapy to Ocrevus.  Risks and benefits discuss. We also discussed oral options such as Vumerity, however, given her cord involvement, I suggest Ocrevus to minimize functional disability.  All questions were answered.  - Dec 2023: hospitalized with progressive leg numbness and treated with IVMP  - Jan 2024:  started Tysabri  - June 2024:  JCV positive  PLAN:  - Check MRI brain, cervical spine, and thoracic spine wwo contrast  - Discontinue Tysabri and switch to Ocrevus - Check hepatis panel, IgG, vitamin D - Check CBC and CMP, immunoglobulin levels every 6 month - OK to take baclofen 10mg  as needed for muscle spasms  Return to clinic in 2 months  --------------------------------------------- History of present illness: In July 2023, she was walking 1-2 miles and at the end of her walk, she began to have numbness and tingling involving the lower legs.  Symptoms lasted 5-10 minutes and self-resolved.  Her symptoms have become constant since October and she also has tightness in the legs.  She takes gabapentin 100mg  during the day as needed and 300mg  at bedtime which provides minimal relief.  Right arm feels a little weaker.  No weakness in the legs.    In September, she began  having right sided shoulder, neck, and arm pain. She was also having numbness into the right hand, especially the thumb and index finger.  She has tingling in all of the fingers.  No numbness/tingling in the left hand. She has weakness in the right arm.  She is going to PT.     She saw Dr. Maurice Small at [facility] for these symptoms and specifically the right sided neck pain.  He ordered MRI cervical spine which showed T2 hyperintensity involving the cervical cord at C3-4 to C4-5.  Subsequent imaging of the thoracic cord shows similar changes at T3 and T12.  MRI brain was normal.  She is here for further evaluation.    She denies cramps.  Earlier in 2023, she had 4-5 spells of left arm and leg drawing up.   UPDATE 05/21/2022:  She went to the ER on 12/3 because of worsening leg heaviness, numbness, and imbalance.  She underwent MRI brain, cervical spine, and thoracic spine which showed new white matter lesions involving the brain as well as previously seen white matter changes at C4-5 and T2-3. Because of high likelihood of MS, she was started on solumedrol.  Since completing steroids, she feels that the right ankle is less stiff.  Unfortunately, there has been no significant change in the paresthesias or balance.  She is using a cane now.   UPDATE 07/29/2022:  She is here for follow-up visit.  She is feeling much better and feels less stiff and less unsteady.  The abnormal sensation over the arm has resolved.  She no longer  has right hand weakness. She continues to have numbness in the right thumb and foot.   She continues to have tingling in the legs with prolonged walking. She has completed two infusions of Tysabri and she tolerated both very well. She is back to work full-time.    UPDATE 12/05/2022:  She is here for sooner than scheduled follow-up due to JCV titer turning positive.  She denies any new weakness or vision changes. She continues to numbness in the base of the thumb and sole of  the foot on the right, which is unchanged.  She is concerned about staying on Tysabri and would like to discuss options.   Medications:  Current Outpatient Medications on File Prior to Visit  Medication Sig Dispense Refill   acetaminophen (TYLENOL) 650 MG CR tablet Take 1,300 mg by mouth as needed for pain.     amLODipine (NORVASC) 2.5 MG tablet TAKE 1 TABLET(2.5 MG) BY MOUTH DAILY 90 tablet 3   baclofen (LIORESAL) 10 MG tablet Take 5-10mg  at bedtime as needed (Patient taking differently: Take 10 mg by mouth. Take 5-10mg  at bedtime as needed pt taking PRN) 30 each 1   celecoxib (CELEBREX) 200 MG capsule Take 200 mg by mouth daily as needed for mild pain.     Cholecalciferol (VITAMIN D) 50 MCG (2000 UT) CAPS Take 2,000 Units by mouth daily.     Cyanocobalamin (VITAMIN B-12 PO) Take 1 capsule by mouth daily.     Multiple Vitamin (MULTIVITAMIN) tablet Take 1 tablet by mouth daily.     natalizumab (TYSABRI) 300 MG/15ML injection Infuse 15 mL every 4 weeks by intravenous route.     vitamin C (ASCORBIC ACID) 500 MG tablet Take 500 mg by mouth daily. Reported on 10/02/2015     ZINC GLUCONATE PO Take 1 tablet by mouth daily.     zolpidem (AMBIEN CR) 6.25 MG CR tablet Take 1 tablet (6.25 mg total) by mouth at bedtime as needed for sleep (do not drive for 8 hours after taking). 30 tablet 5   No current facility-administered medications on file prior to visit.    Allergies: No Known Allergies  Vital Signs:  BP 135/77   Pulse 84   Ht 5\' 4"  (1.626 m)   Wt 157 lb (71.2 kg)   LMP 01/06/2013   SpO2 98%   BMI 26.95 kg/m   Neurological Exam: MENTAL STATUS including orientation to time, place, person, recent and remote memory, attention span and concentration, language, and fund of knowledge is normal.  Speech is not dysarthric.  CRANIAL NERVES:   Pupils equal round and reactive to light.  Normal conjugate, extra-ocular eye movements in all directions of gaze.  No ptosis.  Face is symmetric. Palate  elevates symmetrically.  Tongue is midline.  MOTOR:  Motor strength is 5/5 in all extremities, except trace weakness of the right hand with finger abduction.  No atrophy, fasciculations or abnormal movements.  No pronator drift.  Tone is normal.    MSRs:  Reflexes are 2+/4 throughout, except 3+/4 bilateral patella.  No ankle clonus.  SENSORY:  Intact to vibration throughout.  COORDINATION/GAIT:  Normal finger-to- nose-finger.  Intact rapid alternating movements bilaterally. Gait is normal, unassisted and stable.  Stressed and tandem gait intact   Data: MRI brain, cervical, and thoracic spine wwo contrast 05/11/2022: 1. New hyperintense T2-weighted signal lesions within both occipital lobes and within the cervical and thoracic spinal cord, consistent with demyelinating disease. CSF sampling should be considered. 2. New/worsening spinal cord  lesions at C3-5 and T2-3 with associated contrast enhancement. 3. Unchanged C7 lesion.   Total time spent reviewing records, interview, history/exam, documentation, and coordination of care on day of encounter:  45 min    Thank you for allowing me to participate in patient's care.  If I can answer any additional questions, I would be pleased to do so.    Sincerely,    Saveah Bahar K. Allena Katz, DO

## 2022-12-08 LAB — REFLEX TIQ

## 2022-12-08 LAB — VITAMIN D 25 HYDROXY (VIT D DEFICIENCY, FRACTURES): Vit D, 25-Hydroxy: 55 ng/mL (ref 30–100)

## 2022-12-08 LAB — IGG, IGA, IGM: IgG (Immunoglobin G), Serum: 1299 mg/dL (ref 600–1640)

## 2022-12-08 LAB — ACUTE HEP PANEL AND HEP B SURFACE AB: Hepatitis B Surface Ag: NONREACTIVE

## 2022-12-09 LAB — STRATIFY JCV AB (W/ INDEX) W/ RFLX
Index Value: 0.71 — ABNORMAL HIGH
Stratify JCV (TM) Ab w/Reflex Inhibition: POSITIVE — AB

## 2022-12-09 LAB — IGG, IGA, IGM
IgM, Serum: 106 mg/dL (ref 50–300)
Immunoglobulin A: 401 mg/dL — ABNORMAL HIGH (ref 47–310)

## 2022-12-09 LAB — ACUTE HEP PANEL AND HEP B SURFACE AB
HEPATITIS C ANTIBODY REFILL$(REFL): NONREACTIVE
Hep A IgM: NONREACTIVE
Hep B C IgM: NONREACTIVE

## 2022-12-15 ENCOUNTER — Encounter: Payer: Self-pay | Admitting: Neurology

## 2022-12-16 ENCOUNTER — Telehealth: Payer: Self-pay | Admitting: Pharmacy Technician

## 2022-12-16 NOTE — Telephone Encounter (Addendum)
Auth Submission: APPROVED Site of care: Site of care: [facility] Payer: BCBS Medication & CPT/J Code(s) submitted: OCREVUS [id number] Route of submission (phone, fax, portal):  Phone #[phone] Fax #[phone] Auth type: Buy/Bill Units/visits requested: 300MG  X2, THEN 600MG  Q6MONTHS Reference number: [id number] REF: [id number] Approval from: 12/05/22 - 06/09/23  Ocrevus co-pay card: Pending Phone: [phone] Patient will need to call program to enroll.

## 2022-12-17 ENCOUNTER — Telehealth: Payer: Self-pay | Admitting: Neurology

## 2022-12-17 NOTE — Telephone Encounter (Signed)
[employer], called about a form signed on 12/08/2022. Please call back /KB  For [facility]

## 2022-12-18 NOTE — Telephone Encounter (Signed)
Form updated and re faxed in to fill out missing information.

## 2022-12-20 ENCOUNTER — Ambulatory Visit
Admission: RE | Admit: 2022-12-20 | Discharge: 2022-12-20 | Disposition: A | Discharge status: Home / Self Care | Payer: BC Managed Care – PPO | Source: Ambulatory Visit | Attending: Neurology | Admitting: Neurology

## 2022-12-20 DIAGNOSIS — G35 Multiple sclerosis: Secondary | ICD-10-CM | POA: Diagnosis not present

## 2022-12-20 DIAGNOSIS — R9089 Other abnormal findings on diagnostic imaging of central nervous system: Secondary | ICD-10-CM

## 2022-12-20 MED ORDER — GADOPICLENOL 0.5 MMOL/ML IV SOLN
7.0000 mL | Freq: Once | INTRAVENOUS | Status: AC | PRN
  Administered 2022-12-20: 7 mL via INTRAVENOUS

## 2022-12-22 ENCOUNTER — Encounter: Payer: Self-pay | Admitting: Neurology

## 2022-12-23 ENCOUNTER — Encounter: Payer: Self-pay | Admitting: Neurology

## 2022-12-23 ENCOUNTER — Telehealth: Payer: Self-pay | Admitting: Neurology

## 2022-12-23 NOTE — Telephone Encounter (Signed)
Pt sent a mychart message to Allena Katz this morning about her Tysabri, she states that this is urgent  please call

## 2022-12-23 NOTE — Telephone Encounter (Signed)
Form has been faxed and I have sent patient a Mychart message.

## 2022-12-24 ENCOUNTER — Ambulatory Visit: Payer: BC Managed Care – PPO

## 2022-12-25 ENCOUNTER — Encounter: Payer: Self-pay | Admitting: Family Medicine

## 2022-12-25 ENCOUNTER — Ambulatory Visit (INDEPENDENT_AMBULATORY_CARE_PROVIDER_SITE_OTHER): Payer: BC Managed Care – PPO | Admitting: Family Medicine

## 2022-12-25 ENCOUNTER — Encounter: Payer: Self-pay | Admitting: Neurology

## 2022-12-25 VITALS — BP 128/78 | HR 78 | Temp 97.1°F | Ht 64.0 in | Wt 154.8 lb

## 2022-12-25 DIAGNOSIS — D539 Nutritional anemia, unspecified: Secondary | ICD-10-CM | POA: Diagnosis not present

## 2022-12-25 DIAGNOSIS — I10 Essential (primary) hypertension: Secondary | ICD-10-CM

## 2022-12-25 DIAGNOSIS — M0600 Rheumatoid arthritis without rheumatoid factor, unspecified site: Secondary | ICD-10-CM

## 2022-12-25 DIAGNOSIS — F411 Generalized anxiety disorder: Secondary | ICD-10-CM

## 2022-12-25 DIAGNOSIS — D849 Immunodeficiency, unspecified: Secondary | ICD-10-CM

## 2022-12-25 DIAGNOSIS — Z0000 Encounter for general adult medical examination without abnormal findings: Secondary | ICD-10-CM

## 2022-12-25 DIAGNOSIS — G35 Multiple sclerosis: Secondary | ICD-10-CM | POA: Diagnosis not present

## 2022-12-25 DIAGNOSIS — D649 Anemia, unspecified: Secondary | ICD-10-CM

## 2022-12-25 NOTE — Progress Notes (Signed)
Phone [phone]   Subjective:  Patient presents today for their annual physical. Chief complaint-noted.   See problem oriented charting- ROS- full  review of systems was completed and negative except for: some joint pains, , still some numbness in right palm and bottom of foot  The following were reviewed and entered/updated in epic: Past Medical History:  Diagnosis Date   Allergy    Anemia    history of    Anxiety    Essential hypertension    GERD (gastroesophageal reflux disease)    Heartburn    Hemorrhoids    IBS (irritable bowel syndrome)    Neuromuscular disorder (HCC)    Rheumatoid arthritis (HCC)    Patient Active Problem List   Diagnosis Date Noted   MS (multiple sclerosis) (HCC) 05/12/2022    Priority: High   Rheumatoid arthritis (HCC) 10/04/2018    Priority: High   Essential hypertension 07/19/2014    Priority: Medium    Generalized anxiety disorder 03/15/2014    Priority: Medium    IBS 10/25/2009    Priority: Medium    Immunocompromised (HCC) 07/05/2021    Priority: Low   GERD (gastroesophageal reflux disease) 10/04/2018    Priority: Low   Insomnia 10/04/2018    Priority: Low   H/O total hysterectomy 03/27/2018    Priority: Low   Allergic rhinitis 03/26/2013    Priority: Low   Anemia 01/21/2012    Priority: Low   Myelitis (HCC) 05/11/2022   Acute non-recurrent maxillary sinusitis 09/27/2016   Past Surgical History:  Procedure Laterality Date   ABDOMINAL HYSTERECTOMY     BILATERAL SALPINGECTOMY Bilateral 01/25/2013   Procedure: BILATERAL SALPINGECTOMY;  Surgeon: Levi Aland, MD;  Location: [facility];  Service: Gynecology;  Laterality: Bilateral;   BREAST BIOPSY Right 09/22/2022   [place] RT BREAST BX W LOC DEV 1ST LESION IMG BX SPEC US GUIDE 09/22/2022 [facility]   LAPAROSCOPIC ASSISTED VAGINAL HYSTERECTOMY N/A 01/25/2013   Procedure: LAPAROSCOPIC ASSISTED VAGINAL HYSTERECTOMY;  Surgeon: Levi Aland, MD;  Location: [facility];  Service:  Gynecology;  Laterality: N/A;   thumb surgery Left     Family History  Problem Relation Age of Onset   Hypertension Mother    Hyperlipidemia Mother    Colon polyps Mother    Hypertension Sister    Heart disease Maternal Grandmother    Diabetes Maternal Grandmother    Heart disease Maternal Grandfather    Arthritis Maternal Grandfather    Diabetes Maternal Grandfather    Hyperlipidemia Maternal Grandfather    Kidney disease Maternal Grandfather    Heart disease Paternal Grandmother    Colon cancer Neg Hx    Esophageal cancer Neg Hx    Rectal cancer Neg Hx    Stomach cancer Neg Hx     Medications- reviewed and updated Current Outpatient Medications  Medication Sig Dispense Refill   acetaminophen (TYLENOL) 650 MG CR tablet Take 1,300 mg by mouth as needed for pain.     amLODipine (NORVASC) 2.5 MG tablet TAKE 1 TABLET(2.5 MG) BY MOUTH DAILY 90 tablet 3   baclofen (LIORESAL) 10 MG tablet Take 5-10mg  at bedtime as needed (Patient taking differently: Take 10 mg by mouth. Take 5-10mg  at bedtime as needed pt taking PRN) 30 each 1   celecoxib (CELEBREX) 200 MG capsule Take 200 mg by mouth daily as needed for mild pain.     Cholecalciferol (VITAMIN D) 50 MCG (2000 UT) CAPS Take 2,000 Units by mouth daily.     Cyanocobalamin (  VITAMIN B-12 PO) Take 1 capsule by mouth daily.     Multiple Vitamin (MULTIVITAMIN) tablet Take 1 tablet by mouth daily.     natalizumab (TYSABRI) 300 MG/15ML injection Infuse 15 mL every 4 weeks by intravenous route.     vitamin C (ASCORBIC ACID) 500 MG tablet Take 500 mg by mouth daily. Reported on 10/02/2015     ZINC GLUCONATE PO Take 1 tablet by mouth daily.     zolpidem (AMBIEN CR) 6.25 MG CR tablet Take 1 tablet (6.25 mg total) by mouth at bedtime as needed for sleep (do not drive for 8 hours after taking). (Patient not taking: Reported on 12/25/2022) 30 tablet 5   No current facility-administered medications for this visit.    Allergies-reviewed and  updated No Known Allergies  Social History   Social History Narrative   Single. Lives alone. No kids.       [employer]- still [employer] (but stress just better.    Prior work same and A lot of travel all over country.    [employer] grad. Degree in mass communications and public relations.       Hobbies: time with friends      Right handed    Lives in a two story home   Objective  Objective:  BP 128/78   Pulse 78   Temp (!) 97.1 F (36.2 C)   Ht 5\' 4"  (1.626 m)   Wt 154 lb 12.8 oz (70.2 kg)   LMP 01/06/2013   SpO2 100%   BMI 26.57 kg/m  Gen: NAD, resting comfortably HEENT: Mucous membranes are moist. Oropharynx normal Neck: no thyromegaly CV: RRR no murmurs rubs or gallops Lungs: CTAB no crackles, wheeze, rhonchi Abdomen: soft/nontender/nondistended/normal bowel sounds. No rebound or guarding.  Ext: trace edema Skin: warm, dry Neuro: grossly normal, moves all extremities, PERRLA   Assessment and Plan   57 y.o. female presenting for annual physical.  Health Maintenance counseling: 1. Anticipatory guidance: Patient counseled regarding regular dental exams -q6 months, eye exams -yearly,  avoiding smoking and second hand smoke , limiting alcohol to 1 beverage per day-maybe 1 a week- avoids with Ambien within several hours , no illicit drugs .   2. Risk factor reduction:  Advised patient of need for regular exercise and diet rich and fruits and vegetables to reduce risk of heart attack and stroke.  Exercise- wants to work on improving this- had been walking last year and has fallen back some. 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  Wt Readings from Last 3 Encounters:  12/25/22 154 lb 12.8 oz (70.2 kg)  12/05/22 157 lb (71.2 kg)  11/26/22 157 lb 9.6 oz (71.5 kg)  3. Immunizations/screenings/ancillary studies- Shingrix- still considering-  holding off today as gets ocrevus Immunization History  Administered Date(s) Administered   Influenza,inj,Quad PF,6+ Mos 06/05/2017, 04/23/2018, 03/23/2019, 07/05/2021, 05/26/2022   Moderna Covid-19 Vaccine Bivalent Booster 73yrs & up 01/12/2021, 04/27/2021   Moderna SARS-COV2 Booster Vaccination 04/22/2020   Moderna Sars-Covid-2 Vaccination 08/25/2019, 09/27/2019, 04/22/2020   Tdap 04/11/2016   Unspecified SARS-COV-2 Vaccination 05/23/2022   4. Cervical cancer screening- still sees Dr. Aldona Bar- pelvic exams but no paps as hysterectomy for benign reasons 5. Breast cancer screening-  breast exam with GYN and mammogram - this year and ended up with biopsy which was benign- we are tryign to get records 6. Colon  cancer screening - 10/02/15 with 10 year repeat- with anemia will get stool cards 7. Skin cancer screening- lower risk due to melanin content. advised regular sunscreen use. Denies worrisome, changing, or new skin lesions.  8. Birth control/STD check- not dating/not sexually active 9. Osteoporosis screening at 68- will plan on this 10. Smoking associated screening - never smoker  Status of chronic or acute concerns   #Covid- had this in June  and did quite well overall- headache(s) did bother.   # multiple sclerosis diagnosed 05/2022 with disease burden involving the subcortical white matter, cervical (C4-5) and thoracic (T2-3) cord.  Follows with Dr. Allena Katz on Tysabri initially but considering switching to Ocrevus with JCV positive- currently in process of getting Ocrevus- gets dose tomorrow  #rheumatoid arthritis- follow up with rheumatology. Essentially on as needed Celebrex at the moment - also has some osteoarthritis she reports. Once a month maybe -holding off on humira at present  #hypertension S: medication: amlodipine 2.5 mg -Celebrex could raise BP  Home readings #s: occasional checks- have bene ok BP Readings from Last 3 Encounters:  12/25/22 128/78  12/05/22 135/77   11/26/22 103/66  A/P: stable- continue current medicines   # insomnia S:Medication: Ambien 6.25 mg most nights  A/P: stable- continue current medicines    #anemia- patient had colonoscopy 10/02/15 and reassuring- hyperplastic polyp only. Had diverticula but no evidence of bleeding -7 months ago was 11.7 but down to 10.5 on last check so we opted to do labs as below to further investigate including stool cards. No vaginal bleeding. No rectal bleeding. No blood in urine. No hematemesis or hemoptysis . Could be side effects of medicine but was not reported to her as a side effect of anything she was taking  Recommended follow up: Return in about 6 months (around 06/27/2023) for followup or sooner if needed.Schedule b4 you leave. Future Appointments  Date Time Provider Department Center  12/26/2022  1:00 PM [facility] None  12/27/2022 10:00 AM [facility] MR 2 [facility]  01/27/2023  2:30 PM Patel, Donika K, DO [id number] None   Lab/Order associations:NOT fasting   ICD-10-CM   1. Preventative health care  Z00.00     2. Rheumatoid arthritis with negative rheumatoid factor, involving unspecified site (HCC)  M06.00     3. MS (multiple sclerosis) (HCC)  G35     4. Generalized anxiety disorder  F41.1     5. Essential hypertension  I10     6. Anemia, unspecified type  D64.9     7. Immunocompromised (HCC) Chronic D84.9       No orders of the defined types were placed in this encounter.   Return precautions advised.  Tana Conch, MD

## 2022-12-25 NOTE — Patient Instructions (Addendum)
Let us know if you decide to get your [facility] vaccine or COVID at your pharmacy this fall.  Please stop by lab before you go If you have mychart- we will send your results within 3 business days of [place] receiving them.  If you do not have mychart- we will call you about results within 5 business days of [place] receiving them.  *please also note that you will see labs on mychart as soon as they post. I will later go in and write notes on them- will say "notes from Dr. Durene Cal"   Recommended follow up: Return in about 6 months (around 06/27/2023) for followup or sooner if needed.Schedule b4 you leave.

## 2022-12-26 ENCOUNTER — Ambulatory Visit (INDEPENDENT_AMBULATORY_CARE_PROVIDER_SITE_OTHER): Payer: BC Managed Care – PPO

## 2022-12-26 VITALS — BP 132/76 | HR 82 | Temp 98.7°F | Resp 16 | Ht 64.0 in | Wt 156.0 lb

## 2022-12-26 DIAGNOSIS — G35 Multiple sclerosis: Secondary | ICD-10-CM | POA: Diagnosis not present

## 2022-12-26 LAB — CBC WITH DIFFERENTIAL/PLATELET
Basophils Absolute: 0 10*3/uL (ref 0.0–0.1)
Basophils Relative: 0.7 % (ref 0.0–3.0)
Eosinophils Absolute: 0.2 10*3/uL (ref 0.0–0.7)
Eosinophils Relative: 2.9 % (ref 0.0–5.0)
HCT: 36.7 % (ref 36.0–46.0)
Hemoglobin: 11.5 g/dL — ABNORMAL LOW (ref 12.0–15.0)
Lymphocytes Relative: 57 % — ABNORMAL HIGH (ref 12.0–46.0)
Lymphs Abs: 3.9 10*3/uL (ref 0.7–4.0)
MCHC: 31.4 g/dL (ref 30.0–36.0)
MCV: 88.9 fl (ref 78.0–100.0)
Monocytes Absolute: 0.6 10*3/uL (ref 0.1–1.0)
Monocytes Relative: 8.4 % (ref 3.0–12.0)
Neutro Abs: 2.1 10*3/uL (ref 1.4–7.7)
Neutrophils Relative %: 31 % — ABNORMAL LOW (ref 43.0–77.0)
Platelets: 313 10*3/uL (ref 150.0–400.0)
RBC: 4.12 Mil/uL (ref 3.87–5.11)
RDW: 13.4 % (ref 11.5–15.5)
WBC: 6.8 10*3/uL (ref 4.0–10.5)

## 2022-12-26 LAB — IBC + FERRITIN
Ferritin: 60.4 ng/mL (ref 10.0–291.0)
Iron: 61 ug/dL (ref 42–145)
Saturation Ratios: 15.8 % — ABNORMAL LOW (ref 20.0–50.0)
TIBC: 386.4 ug/dL (ref 250.0–450.0)
Transferrin: 276 mg/dL (ref 212.0–360.0)

## 2022-12-26 LAB — VITAMIN B12: Vitamin B-12: 583 pg/mL (ref 211–911)

## 2022-12-26 MED ORDER — ACETAMINOPHEN 325 MG PO TABS
650.0000 mg | ORAL_TABLET | Freq: Once | ORAL | Status: AC
  Administered 2022-12-26: 650 mg via ORAL

## 2022-12-26 MED ORDER — LORATADINE 10 MG PO TABS
10.0000 mg | ORAL_TABLET | Freq: Once | ORAL | Status: AC
  Administered 2022-12-26: 10 mg via ORAL

## 2022-12-26 MED ORDER — SODIUM CHLORIDE 0.9 % IV SOLN: INTRAVENOUS | Status: DC

## 2022-12-26 MED ORDER — SODIUM CHLORIDE 0.9 % IV SOLN
300.0000 mg | Freq: Once | INTRAVENOUS | Status: AC
  Administered 2022-12-26: 300 mg via INTRAVENOUS
  Filled 2022-12-26: qty 15

## 2022-12-26 NOTE — Progress Notes (Signed)
Diagnosis: Multiple Sclerosis  Provider:  Chilton Greathouse MD  Procedure: IV Infusion  IV Type: Peripheral, IV Location: L Forearm  Tysabri (Natalizumab), Dose: 300 mg  Infusion Start Time: 1340  Infusion Stop Time: 1443  Post Infusion IV Care: Observation period completed and Peripheral IV Discontinued (30 minute observation)  Discharge: Condition: Stable, Destination: Home . AVS Provided  Performed by:  Wyvonne Lenz, RN

## 2022-12-27 ENCOUNTER — Ambulatory Visit
Admission: RE | Admit: 2022-12-27 | Discharge: 2022-12-27 | Disposition: A | Discharge status: Home / Self Care | Payer: BC Managed Care – PPO | Source: Ambulatory Visit | Attending: Neurology | Admitting: Neurology

## 2022-12-27 DIAGNOSIS — R9089 Other abnormal findings on diagnostic imaging of central nervous system: Secondary | ICD-10-CM

## 2022-12-27 DIAGNOSIS — G35 Multiple sclerosis: Secondary | ICD-10-CM | POA: Diagnosis not present

## 2022-12-27 LAB — FOLATE RBC: RBC Folate: 620 ng/mL RBC (ref >280)

## 2022-12-27 MED ORDER — GADOPICLENOL 0.5 MMOL/ML IV SOLN
7.5000 mL | Freq: Once | INTRAVENOUS | Status: AC | PRN
  Administered 2022-12-27: 7.5 mL via INTRAVENOUS

## 2023-01-23 ENCOUNTER — Ambulatory Visit (INDEPENDENT_AMBULATORY_CARE_PROVIDER_SITE_OTHER): Payer: BC Managed Care – PPO

## 2023-01-23 VITALS — BP 116/78 | HR 78 | Temp 98.2°F | Resp 16 | Ht 63.5 in | Wt 155.8 lb

## 2023-01-23 DIAGNOSIS — G35 Multiple sclerosis: Secondary | ICD-10-CM

## 2023-01-23 MED ORDER — SODIUM CHLORIDE 0.9 % IV SOLN
300.0000 mg | Freq: Once | INTRAVENOUS | Status: AC
  Administered 2023-01-23: 300 mg via INTRAVENOUS
  Filled 2023-01-23: qty 15

## 2023-01-23 MED ORDER — ACETAMINOPHEN 325 MG PO TABS
650.0000 mg | ORAL_TABLET | Freq: Once | ORAL | Status: AC
  Administered 2023-01-23: 650 mg via ORAL
  Filled 2023-01-23: qty 2

## 2023-01-23 MED ORDER — LORATADINE 10 MG PO TABS
10.0000 mg | ORAL_TABLET | Freq: Once | ORAL | Status: AC
  Administered 2023-01-23: 10 mg via ORAL
  Filled 2023-01-23: qty 1

## 2023-01-23 MED ORDER — SODIUM CHLORIDE 0.9 % IV SOLN: INTRAVENOUS | Status: DC

## 2023-01-23 NOTE — Progress Notes (Signed)
Diagnosis: Multiple Sclerosis  Provider:  Chilton Greathouse MD  Procedure: IV Infusion  IV Type: Peripheral, IV Location: R Forearm  Tysabri (Natalizumab), Dose: 300 mg  Infusion Start Time: 1407  Infusion Stop Time: 1516  Post Infusion IV Care: Peripheral IV Discontinued and patient stayed 30 minutes of 1 hour observation   Discharge: Condition: Good, Destination: Home . AVS Provided  Performed by:  Loney Hering, LPN

## 2023-01-27 ENCOUNTER — Encounter: Payer: Self-pay | Admitting: Neurology

## 2023-01-27 ENCOUNTER — Ambulatory Visit (INDEPENDENT_AMBULATORY_CARE_PROVIDER_SITE_OTHER): Payer: BC Managed Care – PPO | Admitting: Neurology

## 2023-01-27 VITALS — BP 128/84 | HR 74 | Ht 63.5 in | Wt 157.0 lb

## 2023-01-27 DIAGNOSIS — G35 Multiple sclerosis: Secondary | ICD-10-CM

## 2023-01-27 DIAGNOSIS — T887XXA Unspecified adverse effect of drug or medicament, initial encounter: Secondary | ICD-10-CM

## 2023-01-27 NOTE — Progress Notes (Signed)
Follow-up Visit   Date: 01/27/2023    Karla Howell MRN: [id number] DOB: [birth year]    Karla Howell is a 57 y.o. right-handed African American female with hypertension and RA returning to the clinic for follow-up of multiple sclerosis.  The patient was accompanied to the clinic by mother.  IMPRESSION: Relapsing remitting multiple sclerosis (dx 05/2022) with disease burden involving the subcortical white matter, cervical (C4-5) and thoracic (T2-3) cord.  NMO and antiMOG negative.  She continues to have right hand and leg/foot numbness.  Her weakness and ataxia has improved.  She has been doing well on Tysabri, however her JCV became positive (titer 0.7) in June.  We discussed switching therapy to Ocrevus but she would like to continue Tysabri with close monitoring.    - Dec 2023: hospitalized with progressive leg numbness and treated with IVMP  - Jan 2024:  started Tysabri  - June 2024:  JCV positive  PLAN:  - Check JCV every 3 months (next due in end of Sept)  - Check CBC and CMP every 6 months (at next visit) - Follow MRI brain, c-spine, and T-spine every 6 months - OK to take baclofen 10mg  as needed for muscle spasms - She also had questions regarding vaccine safety and I encouraged her to stay up-to-date with inactivated vaccines.  She should not get any live vaccines.   Return to clinic in 4 months  --------------------------------------------- History of present illness: In July 2023, she was walking 1-2 miles and at the end of her walk, she began to have numbness and tingling involving the lower legs.  Symptoms lasted 5-10 minutes and self-resolved.  Her symptoms have become constant since October and she also has tightness in the legs.  She takes gabapentin 100mg  during the day as needed and 300mg  at bedtime which provides minimal relief.  Right arm feels a little weaker.  No weakness in the legs.    In September, she began having right sided  shoulder, neck, and arm pain. She was also having numbness into the right hand, especially the thumb and index finger.  She has tingling in all of the fingers.  No numbness/tingling in the left hand. She has weakness in the right arm.  She is going to PT.     She saw Dr. Maurice Small at [facility] for these symptoms and specifically the right sided neck pain.  He ordered MRI cervical spine which showed T2 hyperintensity involving the cervical cord at C3-4 to C4-5.  Subsequent imaging of the thoracic cord shows similar changes at T3 and T12.  MRI brain was normal.  She is here for further evaluation.    She denies cramps.  Earlier in 2023, she had 4-5 spells of left arm and leg drawing up.   UPDATE 05/21/2022:  She went to the ER on 12/3 because of worsening leg heaviness, numbness, and imbalance.  She underwent MRI brain, cervical spine, and thoracic spine which showed new white matter lesions involving the brain as well as previously seen white matter changes at C4-5 and T2-3. Because of high likelihood of MS, she was started on solumedrol.  Since completing steroids, she feels that the right ankle is less stiff.  Unfortunately, there has been no significant change in the paresthesias or balance.  She is using a cane now.   UPDATE 07/29/2022:  She is here for follow-up visit.  She is feeling much better and feels less stiff and less unsteady.  The abnormal sensation  over the arm has resolved.  She no longer has right hand weakness. She continues to have numbness in the right thumb and foot.   She continues to have tingling in the legs with prolonged walking. She has completed two infusions of Tysabri and she tolerated both very well. She is back to work full-time.    UPDATE 12/05/2022:  She is here for sooner than scheduled follow-up due to JCV titer turning positive.  She denies any new weakness or vision changes. She continues to numbness in the base of the thumb and sole of the foot on the  right, which is unchanged.  She is concerned about staying on Tysabri and would like to discuss options.   UPDATE 01/27/2023:  She is here for follow-up.  She continues to have numbness at the base of the thumb and right lower leg from the distal thigh to upper leg, which is constant and becomes more noticeable before her next Tysabri infusion.  Symptoms are unchanged. No new vision changes or weakness.    Medications:  Current Outpatient Medications on File Prior to Visit  Medication Sig Dispense Refill   acetaminophen (TYLENOL) 650 MG CR tablet Take 1,300 mg by mouth as needed for pain.     amLODipine (NORVASC) 2.5 MG tablet TAKE 1 TABLET(2.5 MG) BY MOUTH DAILY 90 tablet 3   baclofen (LIORESAL) 10 MG tablet Take 5-10mg  at bedtime as needed (Patient taking differently: Take 10 mg by mouth. Take 5-10mg  at bedtime as needed pt taking PRN) 30 each 1   celecoxib (CELEBREX) 200 MG capsule Take 200 mg by mouth daily as needed for mild pain.     Cholecalciferol (VITAMIN D) 50 MCG (2000 UT) CAPS Take 2,000 Units by mouth daily.     Cyanocobalamin (VITAMIN B-12 PO) Take 1 capsule by mouth daily.     Multiple Vitamin (MULTIVITAMIN) tablet Take 1 tablet by mouth daily.     natalizumab (TYSABRI) 300 MG/15ML injection Infuse 15 mL every 4 weeks by intravenous route.     vitamin C (ASCORBIC ACID) 500 MG tablet Take 500 mg by mouth daily. Reported on 10/02/2015     ZINC GLUCONATE PO Take 1 tablet by mouth daily.     zolpidem (AMBIEN CR) 6.25 MG CR tablet Take 1 tablet (6.25 mg total) by mouth at bedtime as needed for sleep (do not drive for 8 hours after taking). 30 tablet 5   No current facility-administered medications on file prior to visit.    Allergies: No Known Allergies  Vital Signs:  BP 128/84   Pulse 74   Ht 5' 3.5" (1.613 m)   Wt 157 lb (71.2 kg)   LMP 01/06/2013   SpO2 98%   BMI 27.38 kg/m   Neurological Exam: MENTAL STATUS including orientation to time, place, person, recent and remote  memory, attention span and concentration, language, and fund of knowledge is normal.  Speech is not dysarthric.  CRANIAL NERVES:   Pupils equal round and reactive to light.  Normal conjugate, extra-ocular eye movements in all directions of gaze.  No ptosis.  Face is symmetric. Palate elevates symmetrically.  Tongue is midline.  MOTOR:  Motor strength is 5/5 in all extremities, except trace weakness of the right hand with finger abduction.  No atrophy, fasciculations or abnormal movements.  No pronator drift.  Tone is normal.    MSRs:  Reflexes are 2+/4 throughout, except 3+/4 bilateral patella.  No ankle clonus.  SENSORY:  Intact to vibration throughout.  COORDINATION/GAIT:  Normal finger-to- nose-finger.  Intact rapid alternating movements bilaterally. Gait is normal, unassisted and stable.  Stressed and tandem gait intact   Data: MRI brain, cervical, and thoracic spine wwo contrast 05/11/2022: 1. New hyperintense T2-weighted signal lesions within both occipital lobes and within the cervical and thoracic spinal cord, consistent with demyelinating disease. CSF sampling should be considered. 2. New/worsening spinal cord lesions at C3-5 and T2-3 with associated contrast enhancement. 3. Unchanged C7 lesion.   Total time spent reviewing records, interview, history/exam, documentation, and coordination of care on day of encounter:  30 min     Thank you for allowing me to participate in patient's care.  If I can answer any additional questions, I would be pleased to do so.    Sincerely,    Chalon Zobrist K. Allena Katz, DO

## 2023-01-27 NOTE — Patient Instructions (Signed)
We will check JCV titers end of September  I will see you back in 4 months

## 2023-02-11 DIAGNOSIS — M25561 Pain in right knee: Secondary | ICD-10-CM | POA: Diagnosis not present

## 2023-02-11 DIAGNOSIS — M25562 Pain in left knee: Secondary | ICD-10-CM | POA: Diagnosis not present

## 2023-02-20 ENCOUNTER — Ambulatory Visit (INDEPENDENT_AMBULATORY_CARE_PROVIDER_SITE_OTHER): Payer: BC Managed Care – PPO

## 2023-02-20 VITALS — BP 121/77 | HR 82 | Temp 98.2°F | Resp 18 | Ht 63.5 in | Wt 155.0 lb

## 2023-02-20 DIAGNOSIS — G35 Multiple sclerosis: Secondary | ICD-10-CM

## 2023-02-20 MED ORDER — LORATADINE 10 MG PO TABS
10.0000 mg | ORAL_TABLET | Freq: Once | ORAL | Status: AC
  Administered 2023-02-20: 10 mg via ORAL
  Filled 2023-02-20: qty 1

## 2023-02-20 MED ORDER — ACETAMINOPHEN 325 MG PO TABS
650.0000 mg | ORAL_TABLET | Freq: Once | ORAL | Status: AC
  Administered 2023-02-20: 650 mg via ORAL
  Filled 2023-02-20: qty 2

## 2023-02-20 MED ORDER — SODIUM CHLORIDE 0.9 % IV SOLN: INTRAVENOUS | Status: DC

## 2023-02-20 MED ORDER — SODIUM CHLORIDE 0.9 % IV SOLN
300.0000 mg | Freq: Once | INTRAVENOUS | Status: AC
  Administered 2023-02-20: 300 mg via INTRAVENOUS
  Filled 2023-02-20: qty 15

## 2023-02-20 NOTE — Progress Notes (Signed)
Diagnosis: Multiple Sclerosis  Provider:  Chilton Greathouse MD  Procedure: IV Infusion  IV Type: Peripheral, IV Location: R Forearm  Tysabri (Natalizumab), Dose: 300 mg  Infusion Start Time: 1406  Infusion Stop Time: 1508  Post Infusion IV Care: Peripheral IV Discontinued and she stayed 30 min of 1 hour observation.  Discharge: Condition: Good, Destination: Home . AVS Provided  Performed by:  Loney Hering, LPN

## 2023-02-27 ENCOUNTER — Other Ambulatory Visit (INDEPENDENT_AMBULATORY_CARE_PROVIDER_SITE_OTHER): Payer: BC Managed Care – PPO

## 2023-02-27 DIAGNOSIS — G35 Multiple sclerosis: Secondary | ICD-10-CM

## 2023-03-03 LAB — STRATIFY JCV AB (W/ INDEX) W/ RFLX
Index Value: 0.8 — ABNORMAL HIGH
Stratify JCV (TM) Ab w/Reflex Inhibition: POSITIVE — AB

## 2023-03-20 ENCOUNTER — Ambulatory Visit (INDEPENDENT_AMBULATORY_CARE_PROVIDER_SITE_OTHER): Payer: BC Managed Care – PPO

## 2023-03-20 VITALS — BP 121/78 | HR 84 | Temp 97.4°F | Resp 16 | Ht 63.5 in | Wt 151.4 lb

## 2023-03-20 DIAGNOSIS — G35 Multiple sclerosis: Secondary | ICD-10-CM | POA: Diagnosis not present

## 2023-03-20 MED ORDER — LORATADINE 10 MG PO TABS
10.0000 mg | ORAL_TABLET | Freq: Once | ORAL | Status: AC
  Administered 2023-03-20: 10 mg via ORAL
  Filled 2023-03-20: qty 1

## 2023-03-20 MED ORDER — ACETAMINOPHEN 325 MG PO TABS
650.0000 mg | ORAL_TABLET | Freq: Once | ORAL | Status: AC
  Administered 2023-03-20: 650 mg via ORAL
  Filled 2023-03-20: qty 2

## 2023-03-20 MED ORDER — SODIUM CHLORIDE 0.9 % IV SOLN: INTRAVENOUS | Status: DC

## 2023-03-20 MED ORDER — SODIUM CHLORIDE 0.9 % IV SOLN
300.0000 mg | Freq: Once | INTRAVENOUS | Status: AC
  Administered 2023-03-20: 300 mg via INTRAVENOUS
  Filled 2023-03-20: qty 15

## 2023-03-20 NOTE — Progress Notes (Signed)
Diagnosis: Multiple Sclerosis  Provider:  Chilton Greathouse MD  Procedure: IV Infusion  IV Type: Peripheral, IV Location: R Antecubital  Tysabri (Natalizumab), Dose: 300 mg  Infusion Start Time: 1411  Infusion Stop Time: 1511  Post Infusion IV Care: Observation period completed and Peripheral IV Discontinued  Discharge: Condition: Good, Destination: Home . AVS Provided  Performed by:  Rico Ala, LPN

## 2023-03-25 DIAGNOSIS — M25561 Pain in right knee: Secondary | ICD-10-CM | POA: Diagnosis not present

## 2023-03-27 DIAGNOSIS — Z79899 Other long term (current) drug therapy: Secondary | ICD-10-CM | POA: Diagnosis not present

## 2023-03-27 DIAGNOSIS — M0600 Rheumatoid arthritis without rheumatoid factor, unspecified site: Secondary | ICD-10-CM | POA: Diagnosis not present

## 2023-03-27 DIAGNOSIS — M1991 Primary osteoarthritis, unspecified site: Secondary | ICD-10-CM | POA: Diagnosis not present

## 2023-03-28 LAB — LAB REPORT - SCANNED: EGFR: 66

## 2023-04-01 ENCOUNTER — Encounter: Payer: Self-pay | Admitting: Neurology

## 2023-04-02 ENCOUNTER — Encounter: Payer: Self-pay | Admitting: Family

## 2023-04-02 ENCOUNTER — Ambulatory Visit: Payer: BC Managed Care – PPO | Admitting: Family

## 2023-04-02 VITALS — BP 134/88 | HR 100 | Temp 97.8°F | Ht 63.5 in | Wt 151.8 lb

## 2023-04-02 DIAGNOSIS — J069 Acute upper respiratory infection, unspecified: Secondary | ICD-10-CM | POA: Diagnosis not present

## 2023-04-02 DIAGNOSIS — H6123 Impacted cerumen, bilateral: Secondary | ICD-10-CM | POA: Diagnosis not present

## 2023-04-02 MED ORDER — GUAIFENESIN-CODEINE 100-10 MG/5ML PO SYRP: 5.0000 mL | ORAL_SOLUTION | Freq: Three times a day (TID) | ORAL | 0 refills | Status: DC | PRN

## 2023-04-02 MED ORDER — METHYLPREDNISOLONE ACETATE 80 MG/ML IJ SUSP
80.0000 mg | Freq: Once | INTRAMUSCULAR | Status: AC
  Administered 2023-04-02: 80 mg via INTRAMUSCULAR

## 2023-04-02 NOTE — Progress Notes (Signed)
Patient ID: Karla Howell, female    DOB: [birth year], 57 y.o.   MRN: [id number]  Chief Complaint  Patient presents with   Sinus Problem    Pt c/o of sinus infection, pt states she has a headache since last Thursday, cough, chest congestion and yellow mucous. Pt has tried Alzezier Plus, Cough and chest congestion, Robitussin OTC, Pt tested negative for at home COVID test 04/01/2023.    Discussed the use of AI scribe software for clinical note transcription with the patient, who gave verbal consent to proceed.  History of Present Illness   Karla Howell, a patient with a history of COVID-19 and multiple sclerosis, presents with a week-long history of symptoms that started with a headache. The patient initially attributed the headache to her usual sinus issues. The headache was followed by congestion and a sore throat. She now has a productive cough. She has tested negative for COVID-19 at home. She has a history of allergies and has been taking Zyrtec at night. She recently started using Flonase but has not been consistent with its use.      Assessment & Plan:     Upper Respiratory Infection - Symptoms for one week including headache, congestion, sore throat, and productive cough. Likely secondary to allergies or viral infection. Lungs clear & no signs of bacterial infection on examination. -Continue Zyrtec nightly. -Increase Flonase to twice daily for a few days, then decrease to once daily for a couple of weeks. -Administer Depo Medrol 80mg  injection today for systemic anti-inflammatory effect. -Prescribe cough syrup with codeine for symptomatic relief at night. -Call back next week if no improvement in sx.  Cerumen Impaction - Bilateral ear wax impaction noted on examination. No associated symptoms. -Use Debrox drops (pt has kit at home) with cotton ball overnight for three nights. -After the third night, rinse with water in the shower and use Q-tip on the edge of the ear canal  to remove visible wax.     Subjective:    Outpatient Medications Prior to Visit  Medication Sig Dispense Refill   acetaminophen (TYLENOL) 650 MG CR tablet Take 1,300 mg by mouth as needed for pain.     amLODipine (NORVASC) 2.5 MG tablet TAKE 1 TABLET(2.5 MG) BY MOUTH DAILY 90 tablet 3   baclofen (LIORESAL) 10 MG tablet Take 5-10mg  at bedtime as needed (Patient taking differently: Take 10 mg by mouth. Take 5-10mg  at bedtime as needed pt taking PRN) 30 each 1   celecoxib (CELEBREX) 200 MG capsule Take 200 mg by mouth daily as needed for mild pain.     Cholecalciferol (VITAMIN D) 50 MCG (2000 UT) CAPS Take 2,000 Units by mouth daily.     Cyanocobalamin (VITAMIN B-12 PO) Take 1 capsule by mouth daily.     Multiple Vitamin (MULTIVITAMIN) tablet Take 1 tablet by mouth daily.     natalizumab (TYSABRI) 300 MG/15ML injection Infuse 15 mL every 4 weeks by intravenous route.     vitamin C (ASCORBIC ACID) 500 MG tablet Take 500 mg by mouth daily. Reported on 10/02/2015     ZINC GLUCONATE PO Take 1 tablet by mouth daily.     zolpidem (AMBIEN CR) 6.25 MG CR tablet Take 1 tablet (6.25 mg total) by mouth at bedtime as needed for sleep (do not drive for 8 hours after taking). 30 tablet 5   No facility-administered medications prior to visit.   Past Medical History:  Diagnosis Date   Allergy    Anemia  history of    Anxiety    Essential hypertension    GERD (gastroesophageal reflux disease)    Heartburn    Hemorrhoids    IBS (irritable bowel syndrome)    Neuromuscular disorder (HCC)    Rheumatoid arthritis (HCC)    Past Surgical History:  Procedure Laterality Date   ABDOMINAL HYSTERECTOMY     BILATERAL SALPINGECTOMY Bilateral 01/25/2013   Procedure: BILATERAL SALPINGECTOMY;  Surgeon: Levi Aland, MD;  Location: [facility];  Service: Gynecology;  Laterality: Bilateral;   BREAST BIOPSY Right 09/22/2022   [place] RT BREAST BX W LOC DEV 1ST LESION IMG BX SPEC US GUIDE 09/22/2022 [facility]    LAPAROSCOPIC ASSISTED VAGINAL HYSTERECTOMY N/A 01/25/2013   Procedure: LAPAROSCOPIC ASSISTED VAGINAL HYSTERECTOMY;  Surgeon: Levi Aland, MD;  Location: [facility];  Service: Gynecology;  Laterality: N/A;   thumb surgery Left    No Known Allergies    Objective:    Physical Exam Vitals and nursing note reviewed.  Constitutional:      Appearance: Normal appearance. She is not ill-appearing.     Interventions: Face mask in place.  HENT:     Right Ear: There is impacted cerumen.     Left Ear: Ear canal normal. There is impacted cerumen.     Nose:     Right Sinus: No frontal sinus tenderness (pressure).     Left Sinus: No frontal sinus tenderness (pressure).     Mouth/Throat:     Mouth: Mucous membranes are moist.     Pharynx: Posterior oropharyngeal erythema (mild) present. No pharyngeal swelling, oropharyngeal exudate or uvula swelling.     Tonsils: No tonsillar exudate or tonsillar abscesses.  Cardiovascular:     Rate and Rhythm: Normal rate and regular rhythm.  Pulmonary:     Effort: Pulmonary effort is normal.     Breath sounds: Normal breath sounds.  Musculoskeletal:        General: Normal range of motion.  Lymphadenopathy:     Head:     Right side of head: No preauricular or posterior auricular adenopathy.     Left side of head: No preauricular or posterior auricular adenopathy.     Cervical: No cervical adenopathy.  Skin:    General: Skin is warm and dry.  Neurological:     Mental Status: She is alert.  Psychiatric:        Mood and Affect: Mood normal.        Behavior: Behavior normal.    BP 134/88   Pulse 100   Temp 97.8 F (36.6 C)   Ht 5' 3.5" (1.613 m)   Wt 151 lb 12.8 oz (68.9 kg)   LMP 01/06/2013   SpO2 95%   BMI 26.47 kg/m  Wt Readings from Last 3 Encounters:  04/02/23 151 lb 12.8 oz (68.9 kg)  03/20/23 151 lb 6.4 oz (68.7 kg)  02/20/23 155 lb (70.3 kg)       Dulce Sellar, NP

## 2023-04-03 ENCOUNTER — Other Ambulatory Visit: Payer: Self-pay

## 2023-04-03 DIAGNOSIS — G35 Multiple sclerosis: Secondary | ICD-10-CM

## 2023-04-10 ENCOUNTER — Telehealth: Payer: Self-pay | Admitting: Neurology

## 2023-04-10 NOTE — Telephone Encounter (Signed)
Pt called she stated that [facility] is not in her network asking if we can please change her December MRI to a place that is in net work she thinks that [facility] in network, she has a 10,000 bill from [facility] because they are not in net work and did her mRI's. PT was advised to call billing so that they can help her,  Pt was advised that the new location for the her December imaging will have to have PA's done on them as well. She said that she is ok with that as long as they are in net work,

## 2023-04-10 NOTE — Telephone Encounter (Signed)
Patient left message on the VM stating that she has some questions about the upcoming MRI

## 2023-04-15 NOTE — Telephone Encounter (Signed)
In [facility]   [address], [place], [place] [address]   Patient aware I will get PA done and sent information to Mychart when she is ready to get scheduled.

## 2023-04-16 ENCOUNTER — Other Ambulatory Visit: Payer: Self-pay

## 2023-04-16 DIAGNOSIS — G35 Multiple sclerosis: Secondary | ICD-10-CM

## 2023-04-17 ENCOUNTER — Ambulatory Visit (INDEPENDENT_AMBULATORY_CARE_PROVIDER_SITE_OTHER): Payer: BC Managed Care – PPO

## 2023-04-17 VITALS — BP 116/78 | HR 77 | Temp 98.3°F | Resp 18 | Ht 63.5 in | Wt 149.6 lb

## 2023-04-17 DIAGNOSIS — G35 Multiple sclerosis: Secondary | ICD-10-CM

## 2023-04-17 MED ORDER — LORATADINE 10 MG PO TABS
10.0000 mg | ORAL_TABLET | Freq: Once | ORAL | Status: AC
  Administered 2023-04-17: 10 mg via ORAL
  Filled 2023-04-17: qty 1

## 2023-04-17 MED ORDER — SODIUM CHLORIDE 0.9 % IV SOLN: INTRAVENOUS | Status: DC

## 2023-04-17 MED ORDER — ACETAMINOPHEN 325 MG PO TABS
650.0000 mg | ORAL_TABLET | Freq: Once | ORAL | Status: AC
  Administered 2023-04-17: 650 mg via ORAL
  Filled 2023-04-17: qty 2

## 2023-04-17 MED ORDER — SODIUM CHLORIDE 0.9 % IV SOLN
300.0000 mg | Freq: Once | INTRAVENOUS | Status: AC
  Administered 2023-04-17: 300 mg via INTRAVENOUS
  Filled 2023-04-17: qty 15

## 2023-04-17 NOTE — Progress Notes (Signed)
Diagnosis: Multiple Sclerosis  Provider:  Chilton Greathouse MD  Procedure: IV Infusion  IV Type: Peripheral, IV Location: R Forearm  Tysabri (Natalizumab), Dose: 300 mg  Infusion Start Time: 1405  Infusion Stop Time: 1513  Post Infusion IV Care: Observation period completed and Peripheral IV Discontinued  Discharge: Condition: Good, Destination: Home . AVS Declined  Performed by:  Garnette Czech, RN

## 2023-04-24 ENCOUNTER — Other Ambulatory Visit: Payer: Self-pay | Admitting: Family Medicine

## 2023-04-24 ENCOUNTER — Other Ambulatory Visit: Payer: Self-pay

## 2023-04-24 DIAGNOSIS — G35 Multiple sclerosis: Secondary | ICD-10-CM

## 2023-04-28 NOTE — Progress Notes (Signed)
Karla Howell is a 57 y.o. female here for a new problem.  History of Present Illness:   No chief complaint on file.   HPI  Rash Right Leg     Past Medical History:  Diagnosis Date   Allergy    Anemia    history of    Anxiety    Essential hypertension    GERD (gastroesophageal reflux disease)    Heartburn    Hemorrhoids    IBS (irritable bowel syndrome)    Neuromuscular disorder (HCC)    Rheumatoid arthritis (HCC)      Social History   Tobacco Use   Smoking status: Never   Smokeless tobacco: Never  Substance Use Topics   Alcohol use: Not Currently    Alcohol/week: 1.0 standard drink of alcohol    Types: 1 Standard drinks or equivalent per week    Comment: Occasional cocktail/wine   Drug use: Never    Types: Marijuana    Past Surgical History:  Procedure Laterality Date   ABDOMINAL HYSTERECTOMY     BILATERAL SALPINGECTOMY Bilateral 01/25/2013   Procedure: BILATERAL SALPINGECTOMY;  Surgeon: Levi Aland, MD;  Location: [facility];  Service: Gynecology;  Laterality: Bilateral;   BREAST BIOPSY Right 09/22/2022   [place] RT BREAST BX W LOC DEV 1ST LESION IMG BX SPEC US GUIDE 09/22/2022 [facility]   LAPAROSCOPIC ASSISTED VAGINAL HYSTERECTOMY N/A 01/25/2013   Procedure: LAPAROSCOPIC ASSISTED VAGINAL HYSTERECTOMY;  Surgeon: Levi Aland, MD;  Location: [facility];  Service: Gynecology;  Laterality: N/A;   thumb surgery Left     Family History  Problem Relation Age of Onset   Hypertension Mother    Hyperlipidemia Mother    Colon polyps Mother    Hypertension Sister    Heart disease Maternal Grandmother    Diabetes Maternal Grandmother    Heart disease Maternal Grandfather    Arthritis Maternal Grandfather    Diabetes Maternal Grandfather    Hyperlipidemia Maternal Grandfather    Kidney disease Maternal Grandfather    Heart disease Paternal Grandmother    Colon cancer Neg Hx    Esophageal cancer Neg Hx    Rectal cancer Neg Hx    Stomach cancer  Neg Hx     No Known Allergies  Current Medications:   Current Outpatient Medications:    acetaminophen (TYLENOL) 650 MG CR tablet, Take 1,300 mg by mouth as needed for pain., Disp: , Rfl:    amLODipine (NORVASC) 2.5 MG tablet, TAKE 1 TABLET(2.5 MG) BY MOUTH DAILY, Disp: 90 tablet, Rfl: 3   baclofen (LIORESAL) 10 MG tablet, Take 5-10mg  at bedtime as needed (Patient taking differently: Take 10 mg by mouth. Take 5-10mg  at bedtime as needed pt taking PRN), Disp: 30 each, Rfl: 1   celecoxib (CELEBREX) 200 MG capsule, Take 200 mg by mouth daily as needed for mild pain., Disp: , Rfl:    Cholecalciferol (VITAMIN D) 50 MCG (2000 UT) CAPS, Take 2,000 Units by mouth daily., Disp: , Rfl:    Cyanocobalamin (VITAMIN B-12 PO), Take 1 capsule by mouth daily., Disp: , Rfl:    guaiFENesin-codeine (ROBITUSSIN AC) 100-10 MG/5ML syrup, Take 5 mLs by mouth 3 (three) times daily as needed for cough or congestion (May cause drowsiness.)., Disp: 118 mL, Rfl: 0   Multiple Vitamin (MULTIVITAMIN) tablet, Take 1 tablet by mouth daily., Disp: , Rfl:    natalizumab (TYSABRI) 300 MG/15ML injection, Infuse 15 mL every 4 weeks by intravenous route., Disp: , Rfl:    vitamin  C (ASCORBIC ACID) 500 MG tablet, Take 500 mg by mouth daily. Reported on 10/02/2015, Disp: , Rfl:    ZINC GLUCONATE PO, Take 1 tablet by mouth daily., Disp: , Rfl:    zolpidem (AMBIEN CR) 6.25 MG CR tablet, Take 1 tablet (6.25 mg total) by mouth at bedtime as needed for sleep (do not drive for 8 hours after taking)., Disp: 30 tablet, Rfl: 5   Review of Systems:   ROS  Vitals:   There were no vitals filed for this visit.   There is no height or weight on file to calculate BMI.  Physical Exam:   Physical Exam  Assessment and Plan:   There are no diagnoses linked to this encounter.   Jarold Motto, PA-C

## 2023-04-29 ENCOUNTER — Encounter: Payer: Self-pay | Admitting: Physician Assistant

## 2023-04-29 ENCOUNTER — Telehealth: Payer: Self-pay

## 2023-04-29 ENCOUNTER — Ambulatory Visit (INDEPENDENT_AMBULATORY_CARE_PROVIDER_SITE_OTHER): Payer: BC Managed Care – PPO | Admitting: Physician Assistant

## 2023-04-29 VITALS — BP 120/80 | HR 84 | Temp 97.5°F | Ht 63.5 in | Wt 153.5 lb

## 2023-04-29 DIAGNOSIS — B029 Zoster without complications: Secondary | ICD-10-CM | POA: Diagnosis not present

## 2023-04-29 MED ORDER — VALACYCLOVIR HCL 1 G PO TABS: 1000.0000 mg | ORAL_TABLET | Freq: Three times a day (TID) | ORAL | 0 refills | Status: AC

## 2023-04-29 NOTE — Telephone Encounter (Signed)
Called [employer] [phone] to initiate Prior Authorizations for patients MRI Thoracic(cpt-72157)/ MRI Cervical(cpt-72156)/ MRI Brain(cpt-70553).  Spoke to [place] and was informed that MRI Thoracic approved. But MRI Cervical and MRI Brain is needing a peer to peer. Pending case #:[id number] and will need to call [phone].

## 2023-04-30 NOTE — Telephone Encounter (Signed)
Approved.  Auth # [id number]  Valid 04/29/23-05/28/23.

## 2023-05-01 NOTE — Telephone Encounter (Signed)
Message sent to patient on MyChart

## 2023-05-15 ENCOUNTER — Ambulatory Visit: Payer: BC Managed Care – PPO

## 2023-05-22 ENCOUNTER — Other Ambulatory Visit: Payer: BC Managed Care – PPO

## 2023-05-22 DIAGNOSIS — G959 Disease of spinal cord, unspecified: Secondary | ICD-10-CM | POA: Diagnosis not present

## 2023-05-22 DIAGNOSIS — G35 Multiple sclerosis: Secondary | ICD-10-CM | POA: Diagnosis not present

## 2023-05-26 ENCOUNTER — Telehealth: Payer: Self-pay

## 2023-05-26 NOTE — Telephone Encounter (Signed)
Called patient and informed her of MRI brain results. Patient verbalized understanding and had no further questions  or concerns.

## 2023-05-26 NOTE — Telephone Encounter (Signed)
-----   Message from Antony Madura sent at 05/26/2023 12:28 PM EST ----- Can you let patient know that the MRI brain was normal?  Thank you. ----- Message ----- From: Rosezena Sensor Sent: 05/26/2023  10:03 AM EST To: Glendale Chard, DO

## 2023-05-26 NOTE — Telephone Encounter (Signed)
Van Clines, MD  Karl Luke A, CMA Pls let patient know we got results of MRI cervical spine, no new changes. Thanks    Called patient and informed her of cervical MRI results. Patient verbalized understanding and had no further questions or concerns.

## 2023-05-29 ENCOUNTER — Ambulatory Visit (INDEPENDENT_AMBULATORY_CARE_PROVIDER_SITE_OTHER): Payer: BC Managed Care – PPO

## 2023-05-29 VITALS — BP 116/76 | HR 77 | Temp 98.5°F | Resp 20 | Ht 63.5 in | Wt 153.2 lb

## 2023-05-29 DIAGNOSIS — G35 Multiple sclerosis: Secondary | ICD-10-CM

## 2023-05-29 MED ORDER — LORATADINE 10 MG PO TABS
10.0000 mg | ORAL_TABLET | Freq: Once | ORAL | Status: AC
Start: 2023-05-29 — End: 2023-05-29
  Administered 2023-05-29: 10 mg via ORAL
  Filled 2023-05-29: qty 1

## 2023-05-29 MED ORDER — SODIUM CHLORIDE 0.9 % IV SOLN
300.0000 mg | Freq: Once | INTRAVENOUS | Status: AC
Start: 2023-05-29 — End: 2023-05-29
  Administered 2023-05-29: 300 mg via INTRAVENOUS
  Filled 2023-05-29: qty 15

## 2023-05-29 MED ORDER — SODIUM CHLORIDE 0.9 % IV SOLN
INTRAVENOUS | Status: DC
Start: 2023-05-29 — End: 2023-05-29

## 2023-05-29 MED ORDER — ACETAMINOPHEN 325 MG PO TABS
650.0000 mg | ORAL_TABLET | Freq: Once | ORAL | Status: AC
Start: 2023-05-29 — End: 2023-05-29
  Administered 2023-05-29: 650 mg via ORAL
  Filled 2023-05-29: qty 2

## 2023-05-29 NOTE — Progress Notes (Signed)
Diagnosis: Multiple Sclerosis  Provider:  Chilton Greathouse MD  Procedure: IV Infusion  IV Type: Peripheral, IV Location: L Antecubital  Tysabri (Natalizumab), Dose: 300 mg  Infusion Start Time: 1453  Infusion Stop Time: 1553  Post Infusion IV Care: Observation period completed and Peripheral IV Discontinued  Discharge: Condition: Good, Destination: Home . AVS Provided  Performed by:  Nat Math, RN

## 2023-05-30 ENCOUNTER — Other Ambulatory Visit: Payer: BC Managed Care – PPO

## 2023-06-08 ENCOUNTER — Encounter: Payer: Self-pay | Admitting: Neurology

## 2023-06-09 ENCOUNTER — Other Ambulatory Visit: Payer: Self-pay

## 2023-06-09 DIAGNOSIS — G35 Multiple sclerosis: Secondary | ICD-10-CM

## 2023-06-09 DIAGNOSIS — G0491 Myelitis, unspecified: Secondary | ICD-10-CM

## 2023-06-11 ENCOUNTER — Other Ambulatory Visit: Payer: BC Managed Care – PPO

## 2023-06-11 DIAGNOSIS — G35 Multiple sclerosis: Secondary | ICD-10-CM | POA: Diagnosis not present

## 2023-06-11 DIAGNOSIS — G0491 Myelitis, unspecified: Secondary | ICD-10-CM | POA: Diagnosis not present

## 2023-06-12 ENCOUNTER — Other Ambulatory Visit: Payer: Self-pay | Admitting: Family Medicine

## 2023-06-15 ENCOUNTER — Other Ambulatory Visit: Payer: BC Managed Care – PPO

## 2023-06-15 ENCOUNTER — Ambulatory Visit (INDEPENDENT_AMBULATORY_CARE_PROVIDER_SITE_OTHER): Payer: BC Managed Care – PPO | Admitting: Neurology

## 2023-06-15 ENCOUNTER — Telehealth: Payer: Self-pay

## 2023-06-15 ENCOUNTER — Encounter: Payer: Self-pay | Admitting: Neurology

## 2023-06-15 VITALS — BP 130/76 | HR 88 | Ht 63.5 in | Wt 151.0 lb

## 2023-06-15 DIAGNOSIS — G35 Multiple sclerosis: Secondary | ICD-10-CM

## 2023-06-15 NOTE — Telephone Encounter (Signed)
 Recevied a call from Lab needing to change patients lab HBV Screening and Diagnosis to [id number], [id number], [id number]. Labs have been reordered.

## 2023-06-15 NOTE — Progress Notes (Signed)
 Follow-up Visit   Date: 06/15/2023    Karla Howell MRN: [id number] DOB: 1965/07/19    Karla Howell is a 58 y.o. right-handed African American female with hypertension and RA returning to the clinic for follow-up of multiple sclerosis.  The patient was accompanied to the clinic by mother.  IMPRESSION: Relapsing remitting multiple sclerosis (dx 05/2022) with disease burden involving the subcortical white matter, cervical (C4-5) and thoracic (T2-3) cord. Imaging reviewed from 05/2023 and is stable with no new lesions.  NMO and antiMOG negative.  She continues to have right hand and leg/foot numbness.  She stable on Tysabri , however her JCV became positive (titer 0.7) in June.  She expresses interested in switching to Kesimpta .  Risks and benefits discussed.     - Dec 2023: hospitalized with progressive leg numbness and treated with IVMP  - Jan 2024:  started Tysabri   - June 2024:  JCV positive  PLAN:  - Check hepatitis B screening, immunoglobulins.  CBC and CMP is stable. - JCV titer is pending - Start PA for Kesimpta , continue Tysabri  until this has been approved  - OK to take baclofen  10mg  as needed for muscle spasms   Return to clinic in 4 months  --------------------------------------------- History of present illness: In July 2023, she was walking 1-2 miles and at the end of her walk, she began to have numbness and tingling involving the lower legs.  Symptoms lasted 5-10 minutes and self-resolved.  Her symptoms have become constant since October and she also has tightness in the legs.  She takes gabapentin  100mg  during the day as needed and 300mg  at bedtime which provides minimal relief.  Right arm feels a little weaker.  No weakness in the legs.    In September, she began having right sided shoulder, neck, and arm pain. She was also having numbness into the right hand, especially the thumb and index finger.  She has tingling in all of the fingers.  No  numbness/tingling in the left hand. She has weakness in the right arm.  She is going to PT.     She saw Dr. Ibazebo at [facility] for these symptoms and specifically the right sided neck pain.  He ordered MRI cervical spine which showed T2 hyperintensity involving the cervical cord at C3-4 to C4-5.  Subsequent imaging of the thoracic cord shows similar changes at T3 and T12.  MRI brain was normal.  She is here for further evaluation.    She denies cramps.  Earlier in 2023, she had 4-5 spells of left arm and leg drawing up.   UPDATE 05/21/2022:  She went to the ER on 12/3 because of worsening leg heaviness, numbness, and imbalance.  She underwent MRI brain, cervical spine, and thoracic spine which showed new white matter lesions involving the brain as well as previously seen white matter changes at C4-5 and T2-3. Because of high likelihood of MS, she was started on solumedrol.  Since completing steroids, she feels that the right ankle is less stiff.  Unfortunately, there has been no significant change in the paresthesias or balance.  She is using a cane now.   UPDATE 07/29/2022:  She is here for follow-up visit.  She is feeling much better and feels less stiff and less unsteady.  The abnormal sensation over the arm has resolved.  She no longer has right hand weakness. She continues to have numbness in the right thumb and foot.   She continues to have tingling in  the legs with prolonged walking. She has completed two infusions of Tysabri  and she tolerated both very well. She is back to work full-time.    UPDATE 12/05/2022:  She is here for sooner than scheduled follow-up due to JCV titer turning positive.  She denies any new weakness or vision changes. She continues to numbness in the base of the thumb and sole of the foot on the right, which is unchanged.  She is concerned about staying on Tysabri  and would like to discuss options.   UPDATE 01/27/2023:  She is here for follow-up.  She  continues to have numbness at the base of the thumb and right lower leg from the distal thigh to upper leg, which is constant and becomes more noticeable before her next Tysabri  infusion.  Symptoms are unchanged. No new vision changes or weakness.    UPDATE 06/15/2023:  She is here for follow-up visit.  She had right leg shingles in November and has recovered.  She saw a commercial about Kesimpta  and would like to consider switching therapies.  No new complaints.  She continues to have numbness in the right hand and foot.    Medications:  Current Outpatient Medications on File Prior to Visit  Medication Sig Dispense Refill   amLODipine  (NORVASC ) 2.5 MG tablet TAKE 1 TABLET(2.5 MG) BY MOUTH DAILY 90 tablet 3   baclofen  (LIORESAL ) 10 MG tablet Take 5-10mg  at bedtime as needed (Patient taking differently: Take 10 mg by mouth. Take 5-10mg  at bedtime as needed pt taking PRN) 30 each 1   celecoxib (CELEBREX) 200 MG capsule Take 200 mg by mouth daily as needed for mild pain.     Cholecalciferol (VITAMIN D3) 125 MCG (5000 UT) CAPS Take 1 capsule by mouth daily in the afternoon.     Cyanocobalamin  (VITAMIN B-12 PO) Take 1 capsule by mouth daily.     natalizumab  (TYSABRI ) 300 MG/15ML injection Infuse 15 mL every 4 weeks by intravenous route.     vitamin C (ASCORBIC ACID) 500 MG tablet Take 500 mg by mouth daily. Reported on 10/02/2015     ZINC  GLUCONATE PO Take 1 tablet by mouth daily.     zolpidem  (AMBIEN  CR) 6.25 MG CR tablet Take 1 tablet (6.25 mg total) by mouth at bedtime as needed for sleep (do not drive for 8 hours after taking). 30 tablet 5   No current facility-administered medications on file prior to visit.    Allergies: No Known Allergies  Vital Signs:  BP 130/76   Pulse 88   Ht 5' 3.5 (1.613 m)   Wt 151 lb (68.5 kg)   LMP 01/06/2013   SpO2 99%   BMI 26.33 kg/m   Neurological Exam: MENTAL STATUS including orientation to time, place, person, recent and remote memory, attention span  and concentration, language, and fund of knowledge is normal.  Speech is not dysarthric.  CRANIAL NERVES:   Pupils equal round and reactive to light.  Normal conjugate, extra-ocular eye movements in all directions of gaze.  No ptosis.  Face is symmetric. Palate elevates symmetrically.  Tongue is midline.  MOTOR:  Motor strength is 5/5 in all extremities, except trace weakness of the right hand with finger abduction.  No atrophy, fasciculations or abnormal movements.  No pronator drift.  Tone is normal.    MSRs:  Reflexes are 2+/4 throughout, except 3+/4 bilateral patella.  No ankle clonus.  SENSORY:  Intact to vibration throughout.  COORDINATION/GAIT:  Normal finger-to- nose-finger.  Intact rapid alternating movements  bilaterally. Gait is normal, unassisted and stable.    Data: MRI brain, cervical, and thoracic spine wwo contrast 05/11/2022: 1. New hyperintense T2-weighted signal lesions within both occipital lobes and within the cervical and thoracic spinal cord, consistent with demyelinating disease. CSF sampling should be considered. 2. New/worsening spinal cord lesions at C3-5 and T2-3 with associated contrast enhancement. 3. Unchanged C7 lesion.   MRI brain wwo contrast 05/22/2023:  Normal  MRI cervical and thoracic spine wwo contrast 05/22/2023: 1. Demyelinating lesion in the right side of the cord at C2-C5.  2. Demyelinating lesion in the right posterior aspect of the cord at C7.  3. Demyelinating lesion at T2-T3.   The lesions do not enhance. They were present on the prior study from 12/20/2022. They are less conspicuous than on that study   Total time spent reviewing records, interview, history/exam, documentation, and coordination of care on day of encounter:  30 min   Thank you for allowing me to participate in patient's care.  If I can answer any additional questions, I would be pleased to do so.    Sincerely,    Kaytlyn Din K. Tobie, DO

## 2023-06-16 ENCOUNTER — Other Ambulatory Visit: Payer: Self-pay | Admitting: Family Medicine

## 2023-06-16 ENCOUNTER — Other Ambulatory Visit: Payer: BC Managed Care – PPO

## 2023-06-16 DIAGNOSIS — G35 Multiple sclerosis: Secondary | ICD-10-CM | POA: Diagnosis not present

## 2023-06-16 LAB — COMPREHENSIVE METABOLIC PANEL
AG Ratio: 1.3 (calc) (ref 1.0–2.5)
ALT: 9 U/L (ref 6–29)
AST: 15 U/L (ref 10–35)
Albumin: 3.9 g/dL (ref 3.6–5.1)
Alkaline phosphatase (APISO): 69 U/L (ref 37–153)
BUN: 16 mg/dL (ref 7–25)
CO2: 27 mmol/L (ref 20–32)
Calcium: 9.4 mg/dL (ref 8.6–10.4)
Chloride: 107 mmol/L (ref 98–110)
Creat: 0.98 mg/dL (ref 0.50–1.03)
Globulin: 2.9 g/dL (ref 1.9–3.7)
Glucose, Bld: 88 mg/dL (ref 65–99)
Potassium: 4.5 mmol/L (ref 3.5–5.3)
Sodium: 143 mmol/L (ref 135–146)
Total Bilirubin: 0.4 mg/dL (ref 0.2–1.2)
Total Protein: 6.8 g/dL (ref 6.1–8.1)

## 2023-06-16 LAB — CBC
HCT: 36.8 % (ref 35.0–45.0)
Hemoglobin: 11.6 g/dL — ABNORMAL LOW (ref 11.7–15.5)
MCH: 28.7 pg (ref 27.0–33.0)
MCHC: 31.5 g/dL — ABNORMAL LOW (ref 32.0–36.0)
MCV: 91.1 fL (ref 80.0–100.0)
MPV: 10.6 fL (ref 7.5–12.5)
Platelets: 305 10*3/uL (ref 140–400)
RBC: 4.04 10*6/uL (ref 3.80–5.10)
RDW: 12.6 % (ref 11.0–15.0)
WBC: 5 10*3/uL (ref 3.8–10.8)

## 2023-06-16 LAB — STRATIFY JCV AB (W/ INDEX) W/ RFLX
Index Value: 0.69 {index} — ABNORMAL HIGH
Stratify JCV (TM) Ab w/Reflex Inhibition: POSITIVE — AB

## 2023-06-17 ENCOUNTER — Telehealth: Payer: Self-pay

## 2023-06-17 ENCOUNTER — Other Ambulatory Visit: Payer: Self-pay | Admitting: Family Medicine

## 2023-06-17 LAB — IGG, IGA, IGM
IgG (Immunoglobin G), Serum: 1204 mg/dL (ref 600–1640)
IgM, Serum: 111 mg/dL (ref 50–300)
Immunoglobulin A: 424 mg/dL — ABNORMAL HIGH (ref 47–310)

## 2023-06-17 LAB — HEPATITIS B CORE ANTIBODY, TOTAL, WITH REFLEX TO IGM: Hep B Core Total Ab: NONREACTIVE

## 2023-06-17 LAB — HEPATITIS B SURFACE ANTIGEN: Hepatitis B Surface Ag: NONREACTIVE

## 2023-06-17 NOTE — Telephone Encounter (Signed)
 It looks like we just refilled this on January 6-I am going to decline

## 2023-06-17 NOTE — Telephone Encounter (Signed)
 This rx was sent to the pharmacy on 06/15/23.   Copied from CRM [id number]. Topic: Clinical - Medication Refill >> Jun 17, 2023 10:05 AM Latavia C wrote: Most Recent Primary Care Visit:  Provider: JOB LUKES  Department: [facility]  Visit Type: SAME DAY  Date: 04/29/2023  Medication: zolpidem  (AMBIEN  CR) 6.25 MG CR tablet  Has the patient contacted their pharmacy? Yes (Agent: If no, request that the patient contact the pharmacy for the refill. If patient does not wish to contact the pharmacy document the reason why and proceed with request.) (Agent: If yes, when and what did the pharmacy advise?)  Is this the correct pharmacy for this prescription?  If no, delete pharmacy and type the correct one.  This is the patient's preferred pharmacy:   [id number] - [place], [place] - [address] AT [facility] [address] [place] [address] Phone: [phone] Fax: [phone]   Has the prescription been filled recently?   Is the patient out of the medication?   Has the patient been seen for an appointment in the last year OR does the patient have an upcoming appointment? Yes  Can we respond through MyChart? Yes  Agent: Please be advised that Rx refills may take up to 3 business days. We ask that you follow-up with your pharmacy.

## 2023-06-18 NOTE — Telephone Encounter (Signed)
 Can you clear this out please?

## 2023-06-19 ENCOUNTER — Other Ambulatory Visit: Payer: Self-pay

## 2023-06-19 ENCOUNTER — Encounter: Payer: Self-pay | Admitting: Family Medicine

## 2023-06-19 MED ORDER — ZOLPIDEM TARTRATE ER 6.25 MG PO TBCR: 6.2500 mg | EXTENDED_RELEASE_TABLET | Freq: Every evening | ORAL | 5 refills | Status: DC | PRN

## 2023-06-19 NOTE — Telephone Encounter (Signed)
 Copied from CRM [id number]. Topic: Clinical - Prescription Issue >> Jun 18, 2023  3:01 PM Russell PARAS wrote: Reason for CRM: Patient's requested refill for zolpidem  (AMBIEN  CR) 6.25 MG CR tablet was sent to [facility]; however, her insurance has changed and the pharmacy [facility] is [facility] to [facility]. [facility] has informed the [facility] is [facility] get [facility]ill [facility] to that they have never refilled this medication. Patient requests if the refill for zolpidem  could be [facility]. Have removed [facility] from her chart per request.

## 2023-06-22 NOTE — Telephone Encounter (Signed)
 This has been handled, can you clear out please? I forgot to route the initial clear out message to you, sorry.

## 2023-06-24 ENCOUNTER — Encounter: Payer: Self-pay | Admitting: Family Medicine

## 2023-06-24 ENCOUNTER — Other Ambulatory Visit: Payer: Self-pay

## 2023-06-24 MED ORDER — AMLODIPINE BESYLATE 2.5 MG PO TABS: 2.5000 mg | ORAL_TABLET | Freq: Every day | ORAL | 3 refills | Status: DC

## 2023-06-26 ENCOUNTER — Ambulatory Visit: Payer: BC Managed Care – PPO

## 2023-07-02 ENCOUNTER — Telehealth: Payer: Self-pay | Admitting: Pharmacy Technician

## 2023-07-02 NOTE — Telephone Encounter (Signed)
Auth Submission: APPROVED - NEW INSURANCE Site of care: Site of care: [facility] Payer: [employer] Medication & CPT/J Code(s) submitted: Tysabri (Natalizumab) [id number] Route of submission (phone, fax, portal):  Phone # Fax # Auth type: Buy/Bill PB Units/visits requested: 300MG  Q4WKS Reference number: [id number] Approval from: 06/25/23 to 06/08/24

## 2023-07-03 ENCOUNTER — Ambulatory Visit (INDEPENDENT_AMBULATORY_CARE_PROVIDER_SITE_OTHER): Payer: BC Managed Care – PPO

## 2023-07-03 ENCOUNTER — Ambulatory Visit: Payer: BC Managed Care – PPO | Admitting: Family Medicine

## 2023-07-03 VITALS — BP 121/87 | HR 71 | Temp 98.5°F | Resp 18 | Ht 63.0 in | Wt 152.6 lb

## 2023-07-03 DIAGNOSIS — G35 Multiple sclerosis: Secondary | ICD-10-CM

## 2023-07-03 MED ORDER — LORATADINE 10 MG PO TABS
10.0000 mg | ORAL_TABLET | Freq: Once | ORAL | Status: AC
Start: 2023-07-03 — End: 2023-07-03
  Administered 2023-07-03: 10 mg via ORAL
  Filled 2023-07-03: qty 1

## 2023-07-03 MED ORDER — ACETAMINOPHEN 325 MG PO TABS
650.0000 mg | ORAL_TABLET | Freq: Once | ORAL | Status: AC
Start: 2023-07-03 — End: 2023-07-03
  Administered 2023-07-03: 650 mg via ORAL
  Filled 2023-07-03: qty 2

## 2023-07-03 MED ORDER — SODIUM CHLORIDE 0.9 % IV SOLN
INTRAVENOUS | Status: DC
Start: 2023-07-03 — End: 2023-07-03

## 2023-07-03 MED ORDER — SODIUM CHLORIDE 0.9 % IV SOLN
300.0000 mg | Freq: Once | INTRAVENOUS | Status: AC
  Administered 2023-07-03: 300 mg via INTRAVENOUS
  Filled 2023-07-03: qty 15

## 2023-07-03 NOTE — Progress Notes (Signed)
Diagnosis: Multiple Sclerosis  Provider:  Chilton Greathouse MD  Procedure: IV Infusion  IV Type: Peripheral, IV Location: R Forearm  Tysabri (Natalizumab), Dose: 300 mg  Infusion Start Time: 1405  Infusion Stop Time: 1508  Post Infusion IV Care: Observation period completed and Peripheral IV Discontinued  Discharge: Condition: Good, Destination: Home . AVS Provided  Performed by:  Loney Hering, LPN

## 2023-07-09 ENCOUNTER — Encounter: Payer: Self-pay | Admitting: Family

## 2023-07-09 ENCOUNTER — Ambulatory Visit: Payer: BC Managed Care – PPO | Admitting: Family

## 2023-07-09 VITALS — BP 121/81 | HR 88 | Temp 97.3°F | Ht 63.0 in | Wt 149.6 lb

## 2023-07-09 DIAGNOSIS — R6889 Other general symptoms and signs: Secondary | ICD-10-CM | POA: Diagnosis not present

## 2023-07-09 LAB — POCT INFLUENZA A/B
Influenza A, POC: POSITIVE — AB
Influenza B, POC: NEGATIVE

## 2023-07-09 MED ORDER — OSELTAMIVIR PHOSPHATE 75 MG PO CAPS: 75.0000 mg | ORAL_CAPSULE | Freq: Two times a day (BID) | ORAL | 0 refills | Status: DC

## 2023-07-09 NOTE — Progress Notes (Signed)
Patient ID: Karla Howell, female    DOB: November 13, 1965, 58 y.o.   MRN: [id number]  Chief Complaint  Patient presents with   Sinus Problem    Pt c/o Cough, fever of 100.2,  nasal/chest congestion and headaches.SX present since Monday. Covid test at home which was negative. Has tried OTC mucinex and [facility] sinus/pressure which did not help slightly.        Discussed the use of AI scribe software for clinical note transcription with the patient, who gave verbal consent to proceed.  History of Present Illness   The patient presents with flu-like symptoms. The patient experiences cough, headache, body aches, fever, and a sore throat, consistent with flu-like symptoms. She reports soreness from coughing but has no history of asthma, shortness of breath, or chest tightness.  She is managing her symptoms with over-the-counter medications, including tylenol, generic Mucinex, [facility] sinus pressure medication,. She also uses a humidifier at home to alleviate dry air, which is exacerbated by cold temperatures and the use of a fireplace.        Assessment & Plan:     Influenza A - Positive rapid influenza test. No shortness of breath or tightness, only soreness from coughing. No wheezing or congestion noted on exam. -Start Tamiflu today, take bid for 5 days, advised on use & SE. -Start ibuprofen up to 600mg  tid prn for anti-inflammatory effects and symptom relief. -Start Flonase (has at home) twice daily for a few days, then daily until symptoms resolve. -Continue nasal saline spray and humidifier use for symptom relief. -Notify office if not feeling better after completing Tamiflu course.      Subjective:    Outpatient Medications Prior to Visit  Medication Sig Dispense Refill   amLODipine (NORVASC) 2.5 MG tablet Take 1 tablet (2.5 mg total) by mouth daily. 90 tablet 3   baclofen (LIORESAL) 10 MG tablet Take 5-10mg  at bedtime as needed (Patient taking differently: Take 10 mg by mouth. Take  5-10mg  at bedtime as needed pt taking PRN) 30 each 1   celecoxib (CELEBREX) 200 MG capsule Take 200 mg by mouth daily as needed for mild pain.     Cholecalciferol (VITAMIN D3) 125 MCG (5000 UT) CAPS Take 1 capsule by mouth daily in the afternoon.     Cyanocobalamin (VITAMIN B-12 PO) Take 1 capsule by mouth daily.     natalizumab (TYSABRI) 300 MG/15ML injection Infuse 15 mL every 4 weeks by intravenous route.     vitamin C (ASCORBIC ACID) 500 MG tablet Take 500 mg by mouth daily. Reported on 10/02/2015     ZINC GLUCONATE PO Take 1 tablet by mouth daily.     zolpidem (AMBIEN CR) 6.25 MG CR tablet Take 1 tablet (6.25 mg total) by mouth at bedtime as needed for sleep. 30 tablet 5   No facility-administered medications prior to visit.   Past Medical History:  Diagnosis Date   Allergy    Anemia    history of    Anxiety    Essential hypertension    GERD (gastroesophageal reflux disease)    Heartburn    Hemorrhoids    IBS (irritable bowel syndrome)    Neuromuscular disorder (HCC)    Rheumatoid arthritis (HCC)    Past Surgical History:  Procedure Laterality Date   ABDOMINAL HYSTERECTOMY     BILATERAL SALPINGECTOMY Bilateral 01/25/2013   Procedure: BILATERAL SALPINGECTOMY;  Surgeon: Levi Aland, MD;  Location: [facility];  Service: Gynecology;  Laterality: Bilateral;   BREAST BIOPSY  Right 09/22/2022   [place] RT BREAST BX W LOC DEV 1ST LESION IMG BX SPEC US GUIDE 09/22/2022 [facility]   LAPAROSCOPIC ASSISTED VAGINAL HYSTERECTOMY N/A 01/25/2013   Procedure: LAPAROSCOPIC ASSISTED VAGINAL HYSTERECTOMY;  Surgeon: Levi Aland, MD;  Location: [facility];  Service: Gynecology;  Laterality: N/A;   thumb surgery Left    No Known Allergies    Objective:    Physical Exam Vitals and nursing note reviewed.  Constitutional:      Appearance: Normal appearance. She is not ill-appearing.     Interventions: Face mask in place.  HENT:     Right Ear: Tympanic membrane and ear canal normal.     Left  Ear: Tympanic membrane and ear canal normal.     Nose:     Right Sinus: Frontal sinus tenderness present.     Left Sinus: Frontal sinus tenderness present.     Mouth/Throat:     Mouth: Mucous membranes are moist.     Pharynx: Posterior oropharyngeal erythema present. No pharyngeal swelling, oropharyngeal exudate or uvula swelling.     Tonsils: No tonsillar exudate or tonsillar abscesses.  Cardiovascular:     Rate and Rhythm: Normal rate and regular rhythm.  Pulmonary:     Effort: Pulmonary effort is normal.     Breath sounds: Normal breath sounds.  Musculoskeletal:        General: Normal range of motion.  Lymphadenopathy:     Head:     Right side of head: No preauricular or posterior auricular adenopathy.     Left side of head: No preauricular or posterior auricular adenopathy.     Cervical: No cervical adenopathy.  Skin:    General: Skin is warm and dry.  Neurological:     Mental Status: She is alert.  Psychiatric:        Mood and Affect: Mood normal.        Behavior: Behavior normal.    BP 121/81 (BP Location: Left Arm, Patient Position: Sitting, Cuff Size: Normal)   Pulse 88   Temp (!) 97.3 F (36.3 C) (Temporal)   Ht 5\' 3"  (1.6 m)   Wt 149 lb 9.6 oz (67.9 kg)   LMP 01/06/2013   SpO2 96%   BMI 26.50 kg/m  Wt Readings from Last 3 Encounters:  07/09/23 149 lb 9.6 oz (67.9 kg)  07/03/23 152 lb 9.6 oz (69.2 kg)  06/15/23 151 lb (68.5 kg)      Dulce Sellar, NP

## 2023-07-21 ENCOUNTER — Encounter: Payer: Self-pay | Admitting: Neurology

## 2023-07-31 ENCOUNTER — Ambulatory Visit (INDEPENDENT_AMBULATORY_CARE_PROVIDER_SITE_OTHER): Payer: BC Managed Care – PPO

## 2023-07-31 VITALS — BP 118/71 | HR 79 | Temp 97.9°F | Resp 16 | Ht 63.5 in | Wt 153.0 lb

## 2023-07-31 DIAGNOSIS — G35 Multiple sclerosis: Secondary | ICD-10-CM | POA: Diagnosis not present

## 2023-07-31 MED ORDER — SODIUM CHLORIDE 0.9 % IV SOLN
300.0000 mg | Freq: Once | INTRAVENOUS | Status: AC
  Administered 2023-07-31: 300 mg via INTRAVENOUS
  Filled 2023-07-31: qty 15

## 2023-07-31 MED ORDER — ACETAMINOPHEN 325 MG PO TABS
650.0000 mg | ORAL_TABLET | Freq: Once | ORAL | Status: AC
  Administered 2023-07-31: 650 mg via ORAL
  Filled 2023-07-31: qty 2

## 2023-07-31 MED ORDER — SODIUM CHLORIDE 0.9 % IV SOLN: INTRAVENOUS | Status: DC

## 2023-07-31 MED ORDER — LORATADINE 10 MG PO TABS
10.0000 mg | ORAL_TABLET | Freq: Once | ORAL | Status: AC
  Administered 2023-07-31: 10 mg via ORAL
  Filled 2023-07-31: qty 1

## 2023-07-31 NOTE — Progress Notes (Signed)
Diagnosis: Multiple Sclerosis  Provider:  Chilton Greathouse MD  Procedure: IV Infusion  IV Type: Peripheral, IV Location: R Forearm  Tysabri (Natalizumab), Dose: 300 mg  Infusion Start Time: 1413  Infusion Stop Time: 1519  Post Infusion IV Care: Observation period completed and Peripheral IV Discontinued  Discharge: Condition: Good, Destination: Home . AVS Provided  Performed by:  Loney Hering, LPN

## 2023-08-14 NOTE — Telephone Encounter (Signed)
 Pt's insurance company called today to find out if a PA had been started for the patient yet?

## 2023-08-17 ENCOUNTER — Other Ambulatory Visit (HOSPITAL_COMMUNITY): Payer: Self-pay

## 2023-08-17 ENCOUNTER — Encounter: Payer: Self-pay | Admitting: Neurology

## 2023-08-19 ENCOUNTER — Telehealth: Payer: Self-pay | Admitting: Pharmacist

## 2023-08-19 NOTE — Telephone Encounter (Signed)
 Pharmacy Patient Advocate Encounter   Received notification from Physician's Office that prior authorization for Kesimpta 20MG /0.4ML auto-injectors is required/requested.   Insurance verification completed.   The patient is insured through [facility] .   Per test claim: PA required; PA submitted to above mentioned insurance via CoverMyMeds Key/confirmation #/EOC [id number] Status is pending

## 2023-08-20 NOTE — Telephone Encounter (Signed)
 Pharmacy Patient Advocate Encounter  Received notification from [facility] that Prior Authorization for Kesimpta 20MG /0.4ML auto-injectors has been APPROVED from 08/19/2023 to 08/18/2024   PA #/Case ID/Reference #: Lannie Fields [id number]

## 2023-08-23 ENCOUNTER — Encounter: Payer: Self-pay | Admitting: Family Medicine

## 2023-08-24 DIAGNOSIS — M25561 Pain in right knee: Secondary | ICD-10-CM | POA: Diagnosis not present

## 2023-08-24 DIAGNOSIS — M25562 Pain in left knee: Secondary | ICD-10-CM | POA: Diagnosis not present

## 2023-08-25 ENCOUNTER — Encounter: Payer: Self-pay | Admitting: Neurology

## 2023-08-25 NOTE — Telephone Encounter (Signed)
 See Patient Mychart Message. Patient has been contacted and informed.

## 2023-08-26 ENCOUNTER — Other Ambulatory Visit (HOSPITAL_COMMUNITY): Payer: Self-pay

## 2023-08-28 ENCOUNTER — Ambulatory Visit

## 2023-08-28 ENCOUNTER — Ambulatory Visit: Payer: BC Managed Care – PPO

## 2023-09-18 DIAGNOSIS — M1991 Primary osteoarthritis, unspecified site: Secondary | ICD-10-CM | POA: Diagnosis not present

## 2023-09-18 DIAGNOSIS — Z79899 Other long term (current) drug therapy: Secondary | ICD-10-CM | POA: Diagnosis not present

## 2023-09-18 DIAGNOSIS — M0600 Rheumatoid arthritis without rheumatoid factor, unspecified site: Secondary | ICD-10-CM | POA: Diagnosis not present

## 2023-09-21 DIAGNOSIS — Z1231 Encounter for screening mammogram for malignant neoplasm of breast: Secondary | ICD-10-CM | POA: Diagnosis not present

## 2023-09-21 DIAGNOSIS — Z01419 Encounter for gynecological examination (general) (routine) without abnormal findings: Secondary | ICD-10-CM | POA: Diagnosis not present

## 2023-09-21 LAB — HM MAMMOGRAPHY

## 2023-10-13 ENCOUNTER — Encounter: Payer: Self-pay | Admitting: Neurology

## 2023-10-14 ENCOUNTER — Telehealth: Payer: Self-pay

## 2023-10-14 NOTE — Telephone Encounter (Signed)
 Received a call from [facility] from [employer] and wanted to confirm if patient has discontinued Tysabri . They would like a call back to confirm so they may discontinue this medication.

## 2023-10-15 ENCOUNTER — Other Ambulatory Visit: Payer: Self-pay

## 2023-10-15 DIAGNOSIS — G35 Multiple sclerosis: Secondary | ICD-10-CM

## 2023-10-15 NOTE — Telephone Encounter (Signed)
 Thanks, We will d/c the treatment plan and cancel any future appt's. Thanks- KC

## 2023-10-15 NOTE — Telephone Encounter (Signed)
 Correct, she is no longer on Tysabri .

## 2023-10-19 ENCOUNTER — Other Ambulatory Visit

## 2023-10-19 DIAGNOSIS — G35 Multiple sclerosis: Secondary | ICD-10-CM | POA: Diagnosis not present

## 2023-10-20 LAB — COMPREHENSIVE METABOLIC PANEL WITH GFR
AG Ratio: 1.3 (calc) (ref 1.0–2.5)
ALT: 27 U/L (ref 6–29)
AST: 50 U/L — ABNORMAL HIGH (ref 10–35)
Albumin: 4 g/dL (ref 3.6–5.1)
Alkaline phosphatase (APISO): 63 U/L (ref 37–153)
BUN: 17 mg/dL (ref 7–25)
CO2: 30 mmol/L (ref 20–32)
Calcium: 9.4 mg/dL (ref 8.6–10.4)
Chloride: 106 mmol/L (ref 98–110)
Creat: 0.88 mg/dL (ref 0.50–1.03)
Globulin: 3 g/dL (ref 1.9–3.7)
Glucose, Bld: 93 mg/dL (ref 65–99)
Potassium: 4.5 mmol/L (ref 3.5–5.3)
Sodium: 141 mmol/L (ref 135–146)
Total Bilirubin: 0.6 mg/dL (ref 0.2–1.2)
Total Protein: 7 g/dL (ref 6.1–8.1)
eGFR: 76 mL/min/{1.73_m2} (ref >=60)

## 2023-10-20 LAB — CBC
HCT: 36.5 % (ref 35.0–45.0)
Hemoglobin: 11.6 g/dL — ABNORMAL LOW (ref 11.7–15.5)
MCH: 28.2 pg (ref 27.0–33.0)
MCHC: 31.8 g/dL — ABNORMAL LOW (ref 32.0–36.0)
MCV: 88.8 fL (ref 80.0–100.0)
MPV: 9.6 fL (ref 7.5–12.5)
Platelets: 344 10*3/uL (ref 140–400)
RBC: 4.11 10*6/uL (ref 3.80–5.10)
RDW: 12.5 % (ref 11.0–15.0)
WBC: 5.2 10*3/uL (ref 3.8–10.8)

## 2023-10-20 LAB — IGG, IGA, IGM
IgG (Immunoglobin G), Serum: 1150 mg/dL (ref 600–1640)
IgM, Serum: 105 mg/dL (ref 50–300)
Immunoglobulin A: 460 mg/dL — ABNORMAL HIGH (ref 47–310)

## 2023-10-21 ENCOUNTER — Ambulatory Visit: Payer: Self-pay | Admitting: Neurology

## 2023-10-22 ENCOUNTER — Other Ambulatory Visit: Payer: Self-pay

## 2023-10-22 ENCOUNTER — Telehealth: Payer: Self-pay | Admitting: Family Medicine

## 2023-10-22 ENCOUNTER — Other Ambulatory Visit (HOSPITAL_COMMUNITY): Payer: Self-pay

## 2023-10-22 ENCOUNTER — Encounter: Payer: Self-pay | Admitting: Family

## 2023-10-22 ENCOUNTER — Telehealth: Payer: Self-pay

## 2023-10-22 ENCOUNTER — Ambulatory Visit (INDEPENDENT_AMBULATORY_CARE_PROVIDER_SITE_OTHER): Admitting: Family

## 2023-10-22 VITALS — BP 128/81 | HR 78 | Temp 97.5°F | Ht 63.5 in | Wt 147.6 lb

## 2023-10-22 DIAGNOSIS — R748 Abnormal levels of other serum enzymes: Secondary | ICD-10-CM

## 2023-10-22 DIAGNOSIS — R42 Dizziness and giddiness: Secondary | ICD-10-CM

## 2023-10-22 MED ORDER — MECLIZINE HCL 12.5 MG PO TABS: 12.5000 mg | ORAL_TABLET | Freq: Three times a day (TID) | ORAL | 0 refills | Status: DC | PRN

## 2023-10-22 NOTE — Telephone Encounter (Signed)
 Called and spoke with pt and made her aware of below message.

## 2023-10-22 NOTE — Telephone Encounter (Signed)
 See below

## 2023-10-22 NOTE — Telephone Encounter (Signed)
 Pharmacy Patient Advocate Encounter   Received notification from CoverMyMeds that prior authorization for ZOLPIDEM  6.25MG  ER TABS is required/requested.   Insurance verification completed.   The patient is insured through [facility] .   Per test claim: PA required; PA submitted to above mentioned insurance via CoverMyMeds Key/confirmation #/EOC [id number] Status is pending

## 2023-10-22 NOTE — Telephone Encounter (Signed)
 Patient was in office today for a sick visit w/ [facility]. States she was informed via [facility] that she would need to have PCP complete a prior authorization in order to continue getting her full quantity of zolpidem  6.25 mg. Patient requests this process be started asap.

## 2023-10-22 NOTE — Telephone Encounter (Signed)
 Pharmacy Patient Advocate Encounter  Received notification from [facility] that Prior Authorization for Zolpidem   6.25MG  ER TABS has been DENIED.  Full denial letter will be uploaded to the media tab. See denial reason below.   PA #/Case ID/Reference #: [id number]

## 2023-10-22 NOTE — Progress Notes (Signed)
 Patient ID: Karla Howell, female    DOB: 12-04-1965, 58 y.o.   MRN: [id number]  Chief Complaint  Patient presents with   Dizziness    Pt c/o dizziness and nausea, present for 4 days. Has tried OTC mucinex .   Discussed the use of AI scribe software for clinical note transcription with the patient, who gave verbal consent to proceed.  History of Present Illness Karla Howell is a 58 year old female with multiple sclerosis who presents with dizziness and nausea.  Dizziness and nausea have been present since Oct 19, 2023. Dizziness involves a sensation of the room moving, especially when changing positions. Nausea is intermittent and occurs with dizziness episodes. She has not used any specific medication for dizziness prior to this visit.  A recent head cold began around April 26-27, 2025, and is improving. During this illness, she experienced headaches, which have resolved. She denies significant facial pressure but notes mild pressure around her nose.  There is no ear pain or fullness, but she experiences ear popping, similar to pressure changes on an airplane. She acknowledges hx of earwax buildup and has not used over-the-counter ear drops as frequently as recommended.   Assessment & Plan Vertigo - Dizziness and nausea likely due to recent head cold or sinus allergies, possibly exacerbated by ear wax buildup. Denies facial pressure, sinus pain or headache. Symptoms include positional vertigo and nausea. - Prescribed Meclizine 12.5 mg up to three times daily for dizziness. Warned about drowsiness. Suggested halving the dose if drowsiness occurs, noting reduced effectiveness. - Discussed and provided information on home vestibular therapy maneuvers. Consider physical therapy referral if symptoms persist. - Consider use of Zofran  for nausea if needed. - Call office back if sx are not improving by Monday  Cerumen impaction - Wax buildup in both ears, obscuring eardrums.  Denies pressure, fullness or pain. - Recommend Debrox drops for ear wax removal, overnight application with cotton ball for 3 nights, flush with warm water in the mornings. Use Q-tip only on outer edges of ear canal to remove visible wax.   Subjective:     Outpatient Medications Prior to Visit  Medication Sig Dispense Refill   amLODipine  (NORVASC ) 2.5 MG tablet Take 1 tablet (2.5 mg total) by mouth daily. 90 tablet 3   baclofen  (LIORESAL ) 10 MG tablet Take 5-10mg  at bedtime as needed (Patient taking differently: Take 10 mg by mouth. Take 5-10mg  at bedtime as needed pt taking PRN) 30 each 1   celecoxib (CELEBREX) 200 MG capsule Take 200 mg by mouth daily as needed for mild pain.     Cholecalciferol (VITAMIN D3) 125 MCG (5000 UT) CAPS Take 1 capsule by mouth daily in the afternoon.     Cyanocobalamin  (VITAMIN B-12 PO) Take 1 capsule by mouth daily.     KESIMPTA 20 MG/0.4ML SOAJ      natalizumab  (TYSABRI ) 300 MG/15ML injection Infuse 15 mL every 4 weeks by intravenous route.     vitamin C (ASCORBIC ACID) 500 MG tablet Take 500 mg by mouth daily. Reported on 10/02/2015     ZINC  GLUCONATE PO Take 1 tablet by mouth daily.     zolpidem  (AMBIEN  CR) 6.25 MG CR tablet Take 1 tablet (6.25 mg total) by mouth at bedtime as needed for sleep. 30 tablet 5   oseltamivir  (TAMIFLU ) 75 MG capsule Take 1 capsule (75 mg total) by mouth 2 (two) times daily. 10 capsule 0   No facility-administered medications prior to visit.   Past  Medical History:  Diagnosis Date   Allergy    Anemia    history of    Anxiety    Essential hypertension    GERD (gastroesophageal reflux disease)    Heartburn    Hemorrhoids    IBS (irritable bowel syndrome)    Neuromuscular disorder (HCC)    Rheumatoid arthritis (HCC)    Past Surgical History:  Procedure Laterality Date   ABDOMINAL HYSTERECTOMY     BILATERAL SALPINGECTOMY Bilateral 01/25/2013   Procedure: BILATERAL SALPINGECTOMY;  Surgeon: Hamp Levine, MD;  Location:  [facility];  Service: Gynecology;  Laterality: Bilateral;   BREAST BIOPSY Right 09/22/2022   US  RT BREAST BX W LOC DEV 1ST LESION IMG BX SPEC US  GUIDE 09/22/2022 [facility]MOGRAP[facility]   LAPAROSCOPIC ASSISTED VAGINAL HYSTERECTOMY N/A 01/25/2013   Procedure: LAPAROSCOPIC ASSISTED VAGINAL HYSTERECTOMY;  Surgeon: Hamp Levine, MD;  Location: [facility];  Service: Gynecology;  Laterality: N/A;   thumb surgery Left    No Known Allergies    Objective:    Physical Exam Vitals and nursing note reviewed.  Constitutional:      Appearance: Normal appearance.  HENT:     Right Ear: There is impacted cerumen.     Left Ear: There is impacted cerumen.  Eyes:     Extraocular Movements: Extraocular movements intact.     Right eye: No nystagmus.     Left eye: No nystagmus.     Conjunctiva/sclera: Conjunctivae normal.  Cardiovascular:     Rate and Rhythm: Normal rate and regular rhythm.  Pulmonary:     Effort: Pulmonary effort is normal.     Breath sounds: Normal breath sounds.  Musculoskeletal:        General: Normal range of motion.  Lymphadenopathy:     Head:     Right side of head: No submental, submandibular, tonsillar, preauricular, posterior auricular or occipital adenopathy.     Left side of head: No submental, submandibular, tonsillar, preauricular, posterior auricular or occipital adenopathy.     Cervical: No cervical adenopathy.     Upper Body:     Right upper body: No supraclavicular adenopathy.     Left upper body: No supraclavicular adenopathy.  Skin:    General: Skin is warm and dry.  Neurological:     Mental Status: She is alert.  Psychiatric:        Mood and Affect: Mood normal.        Behavior: Behavior normal.    BP 128/81 (BP Location: Left Arm, Patient Position: Sitting, Cuff Size: Normal)   Pulse 78   Temp (!) 97.5 F (36.4 C) (Temporal)   Ht 5' 3.5" (1.613 m)   Wt 147 lb 9.6 oz (67 kg)   LMP 01/06/2013   SpO2 98%   BMI 25.74 kg/m  Wt Readings from Last 3 Encounters:   10/22/23 147 lb 9.6 oz (67 kg)  07/31/23 153 lb (69.4 kg)  07/09/23 149 lb 9.6 oz (67.9 kg)      Versa Gore, NP

## 2023-10-27 ENCOUNTER — Ambulatory Visit (INDEPENDENT_AMBULATORY_CARE_PROVIDER_SITE_OTHER): Payer: BC Managed Care – PPO | Admitting: Neurology

## 2023-10-27 ENCOUNTER — Encounter: Payer: Self-pay | Admitting: Neurology

## 2023-10-27 VITALS — BP 138/83 | HR 89 | Ht 63.5 in | Wt 150.0 lb

## 2023-10-27 DIAGNOSIS — R202 Paresthesia of skin: Secondary | ICD-10-CM | POA: Diagnosis not present

## 2023-10-27 DIAGNOSIS — G35 Multiple sclerosis: Secondary | ICD-10-CM | POA: Diagnosis not present

## 2023-10-27 MED ORDER — BACLOFEN 10 MG PO TABS: ORAL_TABLET | ORAL | 3 refills | Status: DC

## 2023-10-27 NOTE — Patient Instructions (Addendum)
 Nerve testing of the right arm  Recheck labs in August  Repeat MRI in November 2025  ELECTROMYOGRAM AND NERVE CONDUCTION STUDIES (EMG/NCS) INSTRUCTIONS  How to Prepare The neurologist conducting the EMG will need to know if you have certain medical conditions. Tell the neurologist and other EMG lab personnel if you: Have a pacemaker or any other electrical medical device Take blood-thinning medications Have hemophilia, a blood-clotting disorder that causes prolonged bleeding Bathing Take a shower or bath shortly before your exam in order to remove oils from your skin. Don't apply lotions or creams before the exam.  What to Expect You'll likely be asked to change into a hospital gown for the procedure and lie down on an examination table. The following explanations can help you understand what will happen during the exam.  Electrodes. The neurologist or a technician places surface electrodes at various locations on your skin depending on where you're experiencing symptoms. Or the neurologist may insert needle electrodes at different sites depending on your symptoms.  Sensations. The electrodes will at times transmit a tiny electrical current that you may feel as a twinge or spasm. The needle electrode may cause discomfort or pain that usually ends shortly after the needle is removed. If you are concerned about discomfort or pain, you may want to talk to the neurologist about taking a short break during the exam.  Instructions. During the needle EMG, the neurologist will assess whether there is any spontaneous electrical activity when the muscle is at rest - activity that isn't present in healthy muscle tissue - and the degree of activity when you slightly contract the muscle.  He or she will give you instructions on resting and contracting a muscle at appropriate times. Depending on what muscles and nerves the neurologist is examining, he or she may ask you to change positions during the exam.   After your EMG You may experience some temporary, minor bruising where the needle electrode was inserted into your muscle. This bruising should fade within several days. If it persists, contact your primary care doctor.

## 2023-10-27 NOTE — Progress Notes (Addendum)
 Follow-up Visit   Date: 10/27/2023    Karla Howell MRN: [id number] DOB: December 27, 1965    Karla Howell is a 58 y.o. right-handed African American female with hypertension and RA returning to the clinic for follow-up of multiple sclerosis.  The patient was accompanied to the clinic by mother.  IMPRESSION: Relapsing remitting multiple sclerosis (dx 05/2022) with disease burden involving the subcortical white matter, cervical (C4-5) and thoracic (T2-3) cord. Imaging reviewed from 05/2023 and is stable with no new lesions.  NMO and antiMOG negative.  She continues to have right hand and leg/foot numbness.  Given that right hand symptoms can overlap with median nerve distribution, will check for carpal tunnel syndrome.    - Dec 2023: hospitalized with progressive leg numbness and treated with IVMP  - Jan 2024:  started Tysabri   - June 2024:  JCV positive  - March 2025:  switched to Kesimpta  PLAN:  - NCS/EMG of the right arm - Check immunoglobulins, CBC, and CMP in 3 months (mild elevation in AST) - MRI brain, cervical spine, and thoracic spine wwo contrast in November - Continue Kesimpta injection every month - OK to take baclofen  10mg  as needed for spasms  Return to clinic in 6 months  --------------------------------------------- History of present illness: In July 2023, she was walking 1-2 miles and at the end of her walk, she began to have numbness and tingling involving the lower legs.  Symptoms lasted 5-10 minutes and self-resolved.  Her symptoms have become constant since October and she also has tightness in the legs.  She takes gabapentin  100mg  during the day as needed and 300mg  at bedtime which provides minimal relief.  Right arm feels a little weaker.  No weakness in the legs.    In September, she began having right sided shoulder, neck, and arm pain. She was also having numbness into the right hand, especially the thumb and index finger.  She has  tingling in all of the fingers.  No numbness/tingling in the left hand. She has weakness in the right arm.  She is going to PT.     She saw Dr. Ibazebo at [facility] for these symptoms and specifically the right sided neck pain.  He ordered MRI cervical spine which showed T2 hyperintensity involving the cervical cord at C3-4 to C4-5.  Subsequent imaging of the thoracic cord shows similar changes at T3 and T12.  MRI brain was normal.  She is here for further evaluation.    She denies cramps.  Earlier in 2023, she had 4-5 spells of left arm and leg drawing up.   UPDATE 05/21/2022:  She went to the ER on 12/3 because of worsening leg heaviness, numbness, and imbalance.  She underwent MRI brain, cervical spine, and thoracic spine which showed new white matter lesions involving the brain as well as previously seen white matter changes at C4-5 and T2-3. Because of high likelihood of MS, she was started on solumedrol.  Since completing steroids, she feels that the right ankle is less stiff.  Unfortunately, there has been no significant change in the paresthesias or balance.  She is using a cane now.   UPDATE 07/29/2022:  She is here for follow-up visit.  She is feeling much better and feels less stiff and less unsteady.  The abnormal sensation over the arm has resolved.  She no longer has right hand weakness. She continues to have numbness in the right thumb and foot.   She continues to  have tingling in the legs with prolonged walking. She has completed two infusions of Tysabri  and she tolerated both very well. She is back to work full-time.    UPDATE 12/05/2022:  She is here for sooner than scheduled follow-up due to JCV titer turning positive.  She denies any new weakness or vision changes. She continues to numbness in the base of the thumb and sole of the foot on the right, which is unchanged.  She is concerned about staying on Tysabri  and would like to discuss options.   UPDATE 01/27/2023:  She  is here for follow-up.  She continues to have numbness at the base of the thumb and right lower leg from the distal thigh to upper leg, which is constant and becomes more noticeable before her next Tysabri  infusion.  Symptoms are unchanged. No new vision changes or weakness.    UPDATE 06/15/2023:  She is here for follow-up visit.  She had right leg shingles in November and has recovered.  She saw a commercial about Kesimpta and would like to consider switching therapies.  No new complaints.  She continues to have numbness in the right hand and foot.  UPDATE 10/27/2023:  She is here for follow-up visit.  She is no longer on Tysabri  and has started Kesimpta.  She is tolerating it well.   She continues to have numbness in the right hand (worse over the thumb and index finger) and foot.  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  No new neurological symptoms.    Medications:  Current Outpatient Medications on File Prior to Visit  Medication Sig Dispense Refill   amLODipine  (NORVASC ) 2.5 MG tablet Take 1 tablet (2.5 mg total) by mouth daily. 90 tablet 3   baclofen  (LIORESAL ) 10 MG tablet Take 5-10mg  at bedtime as needed (Patient taking differently: Take 10 mg by mouth. Take 5-10mg  at bedtime as needed pt taking PRN) 30 each 1   celecoxib (CELEBREX) 200 MG capsule Take 200 mg by mouth daily as needed for mild pain.     Cholecalciferol (VITAMIN D3) 125 MCG (5000 UT) CAPS Take 1 capsule by mouth daily in the afternoon.     Cyanocobalamin  (VITAMIN B-12 PO) Take 1 capsule by mouth daily.     KESIMPTA 20 MG/0.4ML SOAJ      vitamin C (ASCORBIC ACID) 500 MG tablet Take 500 mg by mouth daily. Reported on 10/02/2015     ZINC  GLUCONATE PO Take 1 tablet by mouth daily.     zolpidem  (AMBIEN  CR) 6.25 MG CR tablet Take 1 tablet (6.25 mg total) by mouth at bedtime as needed for sleep. 30 tablet 5   meclizine  (ANTIVERT ) 12.5 MG tablet Take 1 tablet (12.5 mg total) by mouth 3 (three) times daily as needed for dizziness  (May cause drowsiness, can cut in half.). (Patient not taking: Reported on 10/27/2023) 30 tablet 0   natalizumab  (TYSABRI ) 300 MG/15ML injection Infuse 15 mL every 4 weeks by intravenous route. (Patient not taking: Reported on 10/27/2023)     No current facility-administered medications on file prior to visit.    Allergies: No Known Allergies  Vital Signs:  BP 138/83   Pulse 89   Ht 5' 3.5" (1.613 m)   Wt 150 lb (68 kg)   LMP 01/06/2013   SpO2 97%   BMI 26.15 kg/m   Neurological Exam: MENTAL STATUS including orientation to time, place, person, recent and remote memory, attention span and concentration, language, and fund of knowledge is normal.  Speech is not dysarthric.  CRANIAL NERVES:   Pupils equal round and reactive to light.  Normal conjugate, extra-ocular eye movements in all directions of gaze.  No ptosis.  Face is symmetric. Palate elevates symmetrically.  Tongue is midline.  MOTOR:  Motor strength is 5/5 in all extremities, except trace weakness of the right hand with finger abduction.  No atrophy, fasciculations or abnormal movements.  No pronator drift.  Tone is normal.    MSRs:  Reflexes are 2+/4 throughout, except 3+/4 bilateral patella.  No ankle clonus.  SENSORY:  Intact to vibration throughout. Tinel's sign at the wrist is negative.   COORDINATION/GAIT:  Normal finger-to- nose-finger.  Intact rapid alternating movements bilaterally. Gait is normal, unassisted and stable.    Data: MRI brain, cervical, and thoracic spine wwo contrast 05/11/2022: 1. New hyperintense T2-weighted signal lesions within both occipital lobes and within the cervical and thoracic spinal cord, consistent with demyelinating disease. CSF sampling should be considered. 2. New/worsening spinal cord lesions at C3-5 and T2-3 with associated contrast enhancement. 3. Unchanged C7 lesion.   MRI brain wwo contrast 05/22/2023:  Normal  MRI cervical and thoracic spine wwo contrast 05/22/2023: 1.  Demyelinating lesion in the right side of the cord at C2-C5.  2. Demyelinating lesion in the right posterior aspect of the cord at C7.  3. Demyelinating lesion at T2-T3.   The lesions do not enhance. They were present on the prior study from 12/20/2022. They are less conspicuous than on that study   Lab Results  Component Value Date   ALT 27 10/19/2023   AST 50 (H) 10/19/2023   ALKPHOS 71 11/26/2022   BILITOT 0.6 10/19/2023      Thank you for allowing me to participate in patient's care.  If I can answer any additional questions, I would be pleased to do so.    Sincerely,    Twana Wileman K. Lydia Sams, DO

## 2023-11-09 ENCOUNTER — Ambulatory Visit: Payer: BC Managed Care – PPO | Admitting: Neurology

## 2023-12-07 DIAGNOSIS — M5416 Radiculopathy, lumbar region: Secondary | ICD-10-CM | POA: Diagnosis not present

## 2023-12-07 DIAGNOSIS — M25551 Pain in right hip: Secondary | ICD-10-CM | POA: Diagnosis not present

## 2023-12-07 DIAGNOSIS — M170 Bilateral primary osteoarthritis of knee: Secondary | ICD-10-CM | POA: Diagnosis not present

## 2023-12-18 ENCOUNTER — Ambulatory Visit (INDEPENDENT_AMBULATORY_CARE_PROVIDER_SITE_OTHER): Admitting: Neurology

## 2023-12-18 ENCOUNTER — Other Ambulatory Visit

## 2023-12-18 DIAGNOSIS — R202 Paresthesia of skin: Secondary | ICD-10-CM

## 2023-12-18 NOTE — Procedures (Signed)
  [facility]  [address] [place], [address]  [place], [place] [address] Tel: [phone] Fax: [phone] Test Date:  12/18/2023  Patient: Karla Howell DOB: [birth year] Physician: Tonita Blanch, DO  Sex: Female Height: 5' 3 Ref Phys: Tonita Blanch, DO  ID#: [id number]   Technician:    History: This is a 58 year old female referred for evaluation of right hand paresthesias.  NCV & EMG Findings: Electrodiagnostic testing of the right upper extremity shows: Right median, ulnar, and mixed palmar sensory responses are within normal limits. Right median and ulnar motor responses are within normal limits. There is no evidence of active or chronic motor axonal loss changes affecting any of the tested muscles.  Motor unit configuration and recruitment pattern is within normal limits.  Impression: This is a normal study of the right upper extremity.  In particular, there is no evidence of a cervical radiculopathy or carpal tunnel syndrome.   ___________________________ Tonita Blanch, DO    Nerve Conduction Studies   Stim Site NR Peak (ms) Norm Peak (ms) O-P Amp (V) Norm O-P Amp  Right Median Anti Sensory (2nd Digit)  32 C  Wrist    3.0 <3.6 33.8 >15  Right Ulnar Anti Sensory (5th Digit)  32 C  Wrist    2.3 <3.1 47.6 >10     Stim Site NR Onset (ms) Norm Onset (ms) O-P Amp (mV) Norm O-P Amp Site1 Site2 Delta-0 (ms) Dist (cm) Vel (m/s) Norm Vel (m/s)  Right Median Motor (Abd Poll Brev)  32 C  Wrist    2.8 <4.0 9.8 >6 Elbow Wrist 5.2 29.0 56 >50  Elbow    8.0  9.1         Right Ulnar Motor (Abd Dig Minimi)  32 C  Wrist    2.2 <3.1 11.9 >7 B Elbow Wrist 3.5 22.0 63 >50  B Elbow    5.7  10.8  A Elbow B Elbow 1.6 10.0 63 >50  A Elbow    7.3  10.6            Stim Site NR Peak (ms) Norm Peak (ms) P-T Amp (V) Site1 Site2 Delta-P (ms) Norm Delta (ms)  Right Median/Ulnar Palm Comparison (Wrist - 8cm)  32 C  Median Palm    1.7 <2.2 108.0 Median Palm Ulnar Palm 0.3    Ulnar Palm    1.4 <2.2 30.8       Electromyography   Side Muscle Ins.Act Fibs Fasc Recrt Amp Dur Poly Activation Comment  Right 1stDorInt Nml Nml Nml Nml Nml Nml Nml Nml N/A  Right Abd Poll Brev Nml Nml Nml Nml Nml Nml Nml Nml N/A  Right PronatorTeres Nml Nml Nml Nml Nml Nml Nml Nml N/A  Right Biceps Nml Nml Nml Nml Nml Nml Nml Nml N/A  Right Triceps Nml Nml Nml Nml Nml Nml Nml Nml N/A  Right Deltoid Nml Nml Nml Nml Nml Nml Nml Nml N/A      Waveforms:

## 2023-12-19 LAB — COMPREHENSIVE METABOLIC PANEL WITH GFR
AG Ratio: 1.5 (calc) (ref 1.0–2.5)
ALT: 12 U/L (ref 6–29)
AST: 15 U/L (ref 10–35)
Albumin: 4 g/dL (ref 3.6–5.1)
Alkaline phosphatase (APISO): 65 U/L (ref 37–153)
BUN: 14 mg/dL (ref 7–25)
CO2: 30 mmol/L (ref 20–32)
Calcium: 9.2 mg/dL (ref 8.6–10.4)
Chloride: 107 mmol/L (ref 98–110)
Creat: 0.98 mg/dL (ref 0.50–1.03)
Globulin: 2.7 g/dL (ref 1.9–3.7)
Glucose, Bld: 102 mg/dL — ABNORMAL HIGH (ref 65–99)
Potassium: 4.7 mmol/L (ref 3.5–5.3)
Sodium: 142 mmol/L (ref 135–146)
Total Bilirubin: 0.3 mg/dL (ref 0.2–1.2)
Total Protein: 6.7 g/dL (ref 6.1–8.1)
eGFR: 66 mL/min/1.73m2 (ref >=60)

## 2023-12-21 ENCOUNTER — Ambulatory Visit: Payer: Self-pay | Admitting: Neurology

## 2023-12-21 DIAGNOSIS — M170 Bilateral primary osteoarthritis of knee: Secondary | ICD-10-CM | POA: Diagnosis not present

## 2023-12-30 ENCOUNTER — Other Ambulatory Visit: Payer: Self-pay | Admitting: Family Medicine

## 2023-12-31 DIAGNOSIS — M1712 Unilateral primary osteoarthritis, left knee: Secondary | ICD-10-CM | POA: Diagnosis not present

## 2023-12-31 DIAGNOSIS — M170 Bilateral primary osteoarthritis of knee: Secondary | ICD-10-CM | POA: Diagnosis not present

## 2023-12-31 DIAGNOSIS — M1711 Unilateral primary osteoarthritis, right knee: Secondary | ICD-10-CM | POA: Diagnosis not present

## 2024-01-01 DIAGNOSIS — M170 Bilateral primary osteoarthritis of knee: Secondary | ICD-10-CM | POA: Diagnosis not present

## 2024-01-04 DIAGNOSIS — M1712 Unilateral primary osteoarthritis, left knee: Secondary | ICD-10-CM | POA: Diagnosis not present

## 2024-01-04 DIAGNOSIS — M1711 Unilateral primary osteoarthritis, right knee: Secondary | ICD-10-CM | POA: Diagnosis not present

## 2024-01-04 DIAGNOSIS — M170 Bilateral primary osteoarthritis of knee: Secondary | ICD-10-CM | POA: Diagnosis not present

## 2024-01-21 DIAGNOSIS — M170 Bilateral primary osteoarthritis of knee: Secondary | ICD-10-CM | POA: Diagnosis not present

## 2024-01-26 ENCOUNTER — Other Ambulatory Visit: Payer: Self-pay | Admitting: Neurology

## 2024-03-07 ENCOUNTER — Encounter: Payer: Self-pay | Admitting: Neurology

## 2024-03-07 ENCOUNTER — Telehealth: Payer: Self-pay | Admitting: Neurology

## 2024-03-07 MED ORDER — KESIMPTA 20 MG/0.4ML ~~LOC~~ SOAJ: SUBCUTANEOUS | Status: DC

## 2024-03-07 NOTE — Telephone Encounter (Signed)
 We do not get any samples of Kesimpta. Since this a specialty medication, we can call and request expedited delivery.

## 2024-03-07 NOTE — Telephone Encounter (Signed)
 Called patient and informed her that we have a sample of Kesimpta that was located in the sample fridge for her if needed. Patient stated that she will see if her medication gets delivered today. Will hold sample until notified by patient.   Patient had additional questions for Dr. Tobie:  When are her next MRI's due to be done? Any lab work needed? Since Kesimpta is arriving late and her dates will change from when she usually takes her injections should she keep previous date for next shot? Or should she do the later date?

## 2024-03-07 NOTE — Telephone Encounter (Signed)
 Responded to patient on Mychart message.

## 2024-03-07 NOTE — Telephone Encounter (Signed)
 Pt is returning call. Please call. Thanks

## 2024-03-07 NOTE — Telephone Encounter (Signed)
 Great to see we have samples for her.   She should adjust her future dates based on when she takes her current injection.  This new date becomes the permanent monthly injection date going forward. For example, if her dose was due on the 5th but she takes it on the 10th, future injections should be on the 10th of each month.   She is come for labs - please order immunoglobulins, CBC, and CMP.  MRI brain, cervical spine, and thoracic spine wwo contrast has been ordered and she can get this in November - you may need to call scheduling.

## 2024-03-07 NOTE — Telephone Encounter (Signed)
 Pt. Rx is delayed by Pharmacy and was promised to be delivered today, if not delivered are there any samples or any other options, you are completley out

## 2024-03-07 NOTE — Addendum Note (Signed)
 Addended by: DASIE RHODY A on: 03/07/2024 04:30 PM   Modules accepted: Orders

## 2024-03-09 ENCOUNTER — Other Ambulatory Visit: Payer: Self-pay

## 2024-03-09 DIAGNOSIS — G35A Relapsing-remitting multiple sclerosis: Secondary | ICD-10-CM

## 2024-03-14 ENCOUNTER — Encounter: Payer: Self-pay | Admitting: Family Medicine

## 2024-03-14 ENCOUNTER — Ambulatory Visit (INDEPENDENT_AMBULATORY_CARE_PROVIDER_SITE_OTHER): Admitting: Family Medicine

## 2024-03-14 VITALS — BP 122/78 | HR 88 | Temp 97.7°F | Ht 63.85 in | Wt 145.0 lb

## 2024-03-14 DIAGNOSIS — Z131 Encounter for screening for diabetes mellitus: Secondary | ICD-10-CM

## 2024-03-14 DIAGNOSIS — Z13220 Encounter for screening for lipoid disorders: Secondary | ICD-10-CM

## 2024-03-14 DIAGNOSIS — Z0000 Encounter for general adult medical examination without abnormal findings: Secondary | ICD-10-CM

## 2024-03-14 DIAGNOSIS — I10 Essential (primary) hypertension: Secondary | ICD-10-CM

## 2024-03-14 DIAGNOSIS — E663 Overweight: Secondary | ICD-10-CM | POA: Diagnosis not present

## 2024-03-14 DIAGNOSIS — Z130 Encounter for screening for diseases of the blood and blood-forming organs and certain disorders involving the immune mechanism: Secondary | ICD-10-CM

## 2024-03-14 NOTE — Patient Instructions (Addendum)
 Please stop by lab before you go If you have mychart- we will send your results within 3 business days of us  receiving them.  If you do not have mychart- we will call you about results within 5 business days of us  receiving them.  *please also note that you will see labs on mychart as soon as they post. I will later go in and write notes on them- will say notes from Dr. Katrinka   Recommended follow up: Return in about 1 year (around 03/14/2025) for physical or sooner if needed.Schedule b4 you leave.

## 2024-03-14 NOTE — Progress Notes (Signed)
 Phone [phone]   Subjective:  Patient presents today for their annual physical. Chief complaint-noted.   See problem oriented charting- ROS- full  review of systems was completed and negative except for topics noted under acute/chronic concerns   The following were reviewed and entered/updated in epic: Past Medical History:  Diagnosis Date   Allergy    Anemia    history of    Anxiety    Essential hypertension    GERD (gastroesophageal reflux disease)    Heartburn    Hemorrhoids    IBS (irritable bowel syndrome)    Neuromuscular disorder (HCC)    Rheumatoid arthritis (HCC)    Patient Active Problem List   Diagnosis Date Noted   MS (multiple sclerosis) 05/12/2022    Priority: High   Rheumatoid arthritis (HCC) 10/04/2018    Priority: High   Essential hypertension 07/19/2014    Priority: Medium    Generalized anxiety disorder 03/15/2014    Priority: Medium    IBS 10/25/2009    Priority: Medium    Immunocompromised 07/05/2021    Priority: Low   GERD (gastroesophageal reflux disease) 10/04/2018    Priority: Low   Insomnia 10/04/2018    Priority: Low   H/O total hysterectomy 03/27/2018    Priority: Low   Allergic rhinitis 03/26/2013    Priority: Low   Anemia 01/21/2012    Priority: Low   Myelitis (HCC) 05/11/2022   Acute non-recurrent maxillary sinusitis 09/27/2016   Past Surgical History:  Procedure Laterality Date   ABDOMINAL HYSTERECTOMY     BILATERAL SALPINGECTOMY Bilateral 01/25/2013   Procedure: BILATERAL SALPINGECTOMY;  Surgeon: Oneil FORBES Piety, MD;  Location: [facility];  Service: Gynecology;  Laterality: Bilateral;   BREAST BIOPSY Right 09/22/2022   US  RT BREAST BX W LOC DEV 1ST LESION IMG BX SPEC US  GUIDE 09/22/2022 [facility]   LAPAROSCOPIC ASSISTED VAGINAL HYSTERECTOMY N/A 01/25/2013   Procedure: LAPAROSCOPIC ASSISTED VAGINAL HYSTERECTOMY;  Surgeon: Oneil FORBES Piety, MD;  Location: [facility];  Service: Gynecology;  Laterality: N/A;   thumb  surgery Left     Family History  Problem Relation Age of Onset   Hypertension Mother    Hyperlipidemia Mother    Colon polyps Mother    Hypertension Sister    Heart disease Maternal Grandmother    Diabetes Maternal Grandmother    Heart disease Maternal Grandfather    Arthritis Maternal Grandfather    Diabetes Maternal Grandfather    Hyperlipidemia Maternal Grandfather    Kidney disease Maternal Grandfather    Heart disease Paternal Grandmother    Colon cancer Neg Hx    Esophageal cancer Neg Hx    Rectal cancer Neg Hx    Stomach cancer Neg Hx     Medications- reviewed and updated Current Outpatient Medications  Medication Sig Dispense Refill   amLODipine  (NORVASC ) 2.5 MG tablet Take 1 tablet (2.5 mg total) by mouth daily. 90 tablet 3   baclofen  (LIORESAL ) 10 MG tablet TAKE 5-10MG  AT BEDTIME AS NEEDED 90 tablet 0   celecoxib (CELEBREX) 200 MG capsule Take 200 mg by mouth daily as needed for mild pain.     Cholecalciferol (VITAMIN D3) 125 MCG (5000 UT) CAPS Take 1 capsule by mouth daily in the afternoon.     Cyanocobalamin  (VITAMIN B-12 PO) Take 1 capsule by mouth daily.     KESIMPTA 20 MG/0.4ML SOAJ      Ofatumumab (KESIMPTA) 20 MG/0.4ML SOAJ Medication Samples have been provided to the patient.  Drug name: Kesimpta  Strength: 20mg /0.27ml        Qty: 1  LOT: [id number]  Exp.Date: 12/06/2025  Dosing instructions:   The patient has been instructed regarding the correct time, dose, and frequency of taking this medication, including desired effects and most common side effects.   [name] 4:29 PM 03/07/2024     vitamin C (ASCORBIC ACID) 500 MG tablet Take 500 mg by mouth daily. Reported on 10/02/2015     ZINC  GLUCONATE PO Take 1 tablet by mouth daily.     zolpidem  (AMBIEN  CR) 6.25 MG CR tablet TAKE 1 TABLET BY MOUTH AT BEDTIME AS NEEDED FOR SLEEP. 30 tablet 5   No current facility-administered medications for this visit.    Allergies-reviewed and updated No Known  Allergies  Social History   Social History Narrative   Single. Lives alone. No kids.       [employer]- still [employer] (but stress just better.    Prior work same and A lot of travel all over country.    [employer] grad. Degree in mass communications and public relations.       Hobbies: time with friends      Right handed    Lives in a two story home   Objective  Objective:  BP 122/78 (BP Location: Left Arm, Patient Position: Sitting, Cuff Size: Normal)   Pulse 88   Temp 97.7 F (36.5 C) (Temporal)   Ht 5' 3.85 (1.622 m)   Wt 145 lb (65.8 kg)   LMP 01/06/2013   SpO2 99%   BMI 25.01 kg/m  Gen: NAD, resting comfortably HEENT: Mucous membranes are moist. Oropharynx normal. Bilateral cerumen impaction- team to irrigate bilaterally  Neck: no thyromegaly CV: RRR no murmurs rubs or gallops Lungs: CTAB no crackles, wheeze, rhonchi Abdomen: soft/nontender/nondistended/normal bowel sounds. No rebound or guarding.  Ext: no edema Skin: warm, dry Neuro: grossly normal, moves all extremities, PERRLA   Assessment and Plan   58 y.o. female presenting for annual physical.  Health Maintenance counseling: 1. Anticipatory guidance: Patient counseled regarding regular dental exams -q6 months, eye exams - later this month or next month,  avoiding smoking and second hand smoke , limiting alcohol to 1 beverage per day- maybe 1 a week and avoids Ambien  close to taking this , no illicit drugs.   2. Risk factor reduction:  Advised patient of need for regular exercise and diet rich and fruits and vegetables to reduce risk of heart attack and stroke.  Exercise- walking regularly in past then MS issues- got into gym but personal travel through her off and has not restarted yet.  Diet/weight management-down 9 lbs in last year. Appetite not as high as it used to be and watching what she eats . Cut out candy Wt Readings from Last 3 Encounters:  03/14/24 145 lb (65.8 kg)  10/27/23 150 lb  (68 kg)  10/22/23 147 lb 9.6 oz (67 kg)  3. Immunizations/screenings/ancillary studies- flu and Prevnar later this month. Wants to hold off on shingrix  - wants to consider later date- shingles was not fun  -did have flu January 2025 Immunization History  Administered Date(s) Administered   Influenza,inj,Quad PF,6+ Mos 06/05/2017, 04/23/2018, 03/23/2019, 07/05/2021, 05/26/2022   Moderna Covid-19 Vaccine Bivalent Booster 58yrs & up 01/12/2021, 04/27/2021   Moderna SARS-COV2 Booster Vaccination 04/22/2020   Moderna Sars-Covid-2 Vaccination 08/25/2019, 09/27/2019, 04/22/2020   Tdap 04/11/2016   Unspecified SARS-COV-2 Vaccination 05/23/2022  4. Cervical cancer screening- still sees Dr. Rox- pelvic exams  but no paps as hysterectomy for benign reasons  5. Breast cancer screening-  breast exam with GYN and mammogram - 09/21/23 6. Colon cancer screening - 10/02/15 with 10 year repeat-  anemia relatively stable 7. Skin cancer screening- lower risk due to melanin content.  advised regular sunscreen use. Denies worrisome, changing, or new skin lesions.  8. Birth control/STD check- not dating/not sexually active  9. Osteoporosis screening at 1- will plan on this- wants to wait  10. Smoking associated screening - never smoker  Status of chronic or acute concerns   # Hypertension-well-controlled with amlodipine  2.5 norms daily  # MS-remains on kesimpta self injectoin with overall stability . Prior JCV postiive and had to come off tysabri . Sees Dr. Tobie -upcoming MRI's to assess status but labs have been stable  #Rheumatoid arthritis- sees rheumatology. Celebrex as needed - not needing. Still doing well off of meds  #insomnia- on Ambien  6.25 mg XR  #vertigo- had in may- no recurrent issues  #screen lipids- The 10-year ASCVD risk score (Arnett DK, et al., 2019) is: 6% Lab Results  Component Value Date   CHOL 165 07/12/2021   HDL 37.00 (L) 07/12/2021   LDLCALC 90 07/12/2021   TRIG 189.0 (H)  07/12/2021   CHOLHDL 4 07/12/2021   Recommended follow up: No follow-ups on file. Future Appointments  Date Time Provider Department Center  03/31/2024  3:45 PM [facility] CLINICAL SUPPORT [facility] Willo Milian  05/02/2024  3:30 PM Patel, Donika K, DO [facility] None    Lab/Order associations: fasting- apple, crackers, almonds and skipped lunch   ICD-10-CM   1. Preventative health care  Z00.00     2. Essential hypertension  I10     3. Overweight  E66.3 Hemoglobin A1c    4. Screening for diabetes mellitus  Z13.1 Hemoglobin A1c    5. Screening for hyperlipidemia  Z13.220 Comprehensive metabolic panel with GFR    Lipid panel    6. Screening for deficiency anemia  Z13.0 CBC with Differential/Platelet    IBC + Ferritin      No orders of the defined types were placed in this encounter.   Return precautions advised.  Garnette Lukes, MD

## 2024-03-15 ENCOUNTER — Ambulatory Visit: Payer: Self-pay | Admitting: Family Medicine

## 2024-03-15 LAB — LIPID PANEL
Cholesterol: 143 mg/dL (ref 0–200)
HDL: 54 mg/dL (ref >39.00)
LDL Cholesterol: 66 mg/dL (ref 0–99)
NonHDL: 89.44
Total CHOL/HDL Ratio: 3
Triglycerides: 119 mg/dL (ref 0.0–149.0)
VLDL: 23.8 mg/dL (ref 0.0–40.0)

## 2024-03-15 LAB — COMPREHENSIVE METABOLIC PANEL WITH GFR
ALT: 13 U/L (ref 0–35)
AST: 22 U/L (ref 0–37)
Albumin: 4.3 g/dL (ref 3.5–5.2)
Alkaline Phosphatase: 61 U/L (ref 39–117)
BUN: 14 mg/dL (ref 6–23)
CO2: 29 meq/L (ref 19–32)
Calcium: 9.7 mg/dL (ref 8.4–10.5)
Chloride: 105 meq/L (ref 96–112)
Creatinine, Ser: 0.74 mg/dL (ref 0.40–1.20)
GFR: 88.63 mL/min (ref >60.00)
Glucose, Bld: 90 mg/dL (ref 70–99)
Potassium: 4.5 meq/L (ref 3.5–5.1)
Sodium: 142 meq/L (ref 135–145)
Total Bilirubin: 0.5 mg/dL (ref 0.2–1.2)
Total Protein: 7.6 g/dL (ref 6.0–8.3)

## 2024-03-15 LAB — CBC WITH DIFFERENTIAL/PLATELET
Basophils Absolute: 0.1 K/uL (ref 0.0–0.1)
Basophils Relative: 1 % (ref 0.0–3.0)
Eosinophils Absolute: 0.1 K/uL (ref 0.0–0.7)
Eosinophils Relative: 1 % (ref 0.0–5.0)
HCT: 37.9 % (ref 36.0–46.0)
Hemoglobin: 12.3 g/dL (ref 12.0–15.0)
Lymphocytes Relative: 28.2 % (ref 12.0–46.0)
Lymphs Abs: 1.4 K/uL (ref 0.7–4.0)
MCHC: 32.4 g/dL (ref 30.0–36.0)
MCV: 87.9 fl (ref 78.0–100.0)
Monocytes Absolute: 0.4 K/uL (ref 0.1–1.0)
Monocytes Relative: 8.2 % (ref 3.0–12.0)
Neutro Abs: 3.1 K/uL (ref 1.4–7.7)
Neutrophils Relative %: 61.6 % (ref 43.0–77.0)
Platelets: 313 K/uL (ref 150.0–400.0)
RBC: 4.32 Mil/uL (ref 3.87–5.11)
RDW: 12.8 % (ref 11.5–15.5)
WBC: 5.1 K/uL (ref 4.0–10.5)

## 2024-03-15 LAB — IBC + FERRITIN
Ferritin: 48 ng/mL (ref 10.0–291.0)
Iron: 57 ug/dL (ref 42–145)
Saturation Ratios: 13.9 % — ABNORMAL LOW (ref 20.0–50.0)
TIBC: 408.8 ug/dL (ref 250.0–450.0)
Transferrin: 292 mg/dL (ref 212.0–360.0)

## 2024-03-15 LAB — HEMOGLOBIN A1C: Hgb A1c MFr Bld: 5.3 % (ref 4.6–6.5)

## 2024-03-31 ENCOUNTER — Ambulatory Visit (INDEPENDENT_AMBULATORY_CARE_PROVIDER_SITE_OTHER)

## 2024-03-31 DIAGNOSIS — Z23 Encounter for immunization: Secondary | ICD-10-CM | POA: Diagnosis not present

## 2024-03-31 NOTE — Progress Notes (Signed)
 Patient is in office today for a nurse visit for Immunization.Pneum 20 left deltoid flu vaccine in the right deltoid Patient tolerated well.

## 2024-04-01 DIAGNOSIS — M170 Bilateral primary osteoarthritis of knee: Secondary | ICD-10-CM | POA: Diagnosis not present

## 2024-04-12 ENCOUNTER — Encounter: Payer: Self-pay | Admitting: Neurology

## 2024-04-13 NOTE — Progress Notes (Signed)
 Prior authorization submitted on [employer] for MRI Cervical, Thoracic, and Brain. Approved. Authorization#:[id number]. Approval dates: 04/13/24-05/12/24.

## 2024-04-29 DIAGNOSIS — G35D Multiple sclerosis, unspecified: Secondary | ICD-10-CM | POA: Diagnosis not present

## 2024-05-02 ENCOUNTER — Ambulatory Visit: Admitting: Neurology

## 2024-05-03 ENCOUNTER — Telehealth: Payer: Self-pay | Admitting: Neurology

## 2024-05-03 NOTE — Telephone Encounter (Signed)
 Please let pt know that MRI thoracic spine shows stable disease, no new lesions.  MRI thoracic spine wwo contrast 04/29/2024 at [facility]:  T2 hyperintensities consistent with demyelinating plaques at C7 and T2-3 unchanged in the prior exam.  No new lesions.

## 2024-05-04 ENCOUNTER — Other Ambulatory Visit

## 2024-05-04 DIAGNOSIS — G35A Relapsing-remitting multiple sclerosis: Secondary | ICD-10-CM | POA: Diagnosis not present

## 2024-05-04 NOTE — Telephone Encounter (Signed)
Mychart message sent to patient of results

## 2024-05-06 LAB — COMPREHENSIVE METABOLIC PANEL WITH GFR
AG Ratio: 1.5 (calc) (ref 1.0–2.5)
ALT: 13 U/L (ref 6–29)
AST: 17 U/L (ref 10–35)
Albumin: 3.8 g/dL (ref 3.6–5.1)
Alkaline phosphatase (APISO): 63 U/L (ref 37–153)
BUN: 14 mg/dL (ref 7–25)
CO2: 28 mmol/L (ref 20–32)
Calcium: 9.5 mg/dL (ref 8.6–10.4)
Chloride: 107 mmol/L (ref 98–110)
Creat: 0.87 mg/dL (ref 0.50–1.03)
Globulin: 2.5 g/dL (ref 1.9–3.7)
Glucose, Bld: 90 mg/dL (ref 65–99)
Potassium: 4.6 mmol/L (ref 3.5–5.3)
Sodium: 143 mmol/L (ref 135–146)
Total Bilirubin: 0.5 mg/dL (ref 0.2–1.2)
Total Protein: 6.3 g/dL (ref 6.1–8.1)
eGFR: 77 mL/min/1.73m2 (ref >=60)

## 2024-05-06 LAB — CBC WITH DIFFERENTIAL/PLATELET
Absolute Lymphocytes: 1525 {cells}/uL (ref 850–3900)
Absolute Monocytes: 349 {cells}/uL (ref 200–950)
Basophils Absolute: 21 {cells}/uL (ref 0–200)
Basophils Relative: 0.5 %
Eosinophils Absolute: 49 {cells}/uL (ref 15–500)
Eosinophils Relative: 1.2 %
HCT: 38.2 % (ref 35.9–46.0)
Hemoglobin: 12 g/dL (ref 11.7–15.5)
MCH: 28.1 pg (ref 27.0–33.0)
MCHC: 31.4 g/dL — ABNORMAL LOW (ref 31.6–35.4)
MCV: 89.5 fL (ref 81.4–101.7)
MPV: 9.4 fL (ref 7.5–12.5)
Monocytes Relative: 8.5 %
Neutro Abs: 2157 {cells}/uL (ref 1500–7800)
Neutrophils Relative %: 52.6 %
Platelets: 369 Thousand/uL (ref 140–400)
RBC: 4.27 Million/uL (ref 3.80–5.10)
RDW: 11.8 % (ref 11.0–15.0)
Total Lymphocyte: 37.2 %
WBC: 4.1 Thousand/uL (ref 3.8–10.8)

## 2024-05-06 LAB — IGG, IGA, IGM
IgG (Immunoglobin G), Serum: 1053 mg/dL (ref 600–1640)
IgM, Serum: 108 mg/dL (ref 50–300)
Immunoglobulin A: 454 mg/dL — ABNORMAL HIGH (ref 47–310)

## 2024-05-09 DIAGNOSIS — M25562 Pain in left knee: Secondary | ICD-10-CM | POA: Diagnosis not present

## 2024-05-09 DIAGNOSIS — M25561 Pain in right knee: Secondary | ICD-10-CM | POA: Diagnosis not present

## 2024-05-10 ENCOUNTER — Telehealth: Payer: Self-pay | Admitting: Neurology

## 2024-05-10 NOTE — Telephone Encounter (Signed)
 Who's calling (name and relationship to patient) : Brittany C; [facility]   Best contact number: [phone]-  Provider they see: Dr.Patel   Reason for call: Brittany was calling regarding 2 MRI studies.  The orders will expire before the next scheduled appt. Scheduled for 05/13/2024.

## 2024-05-10 NOTE — Telephone Encounter (Signed)
 I called [facility] to get her MRI pushed up they have no openings. Trying to get on her PA approval pushed back at this time,

## 2024-05-11 NOTE — Telephone Encounter (Signed)
 Attempted to redo Cervical and Brain MRI. Unable to complete this online and will have to call insurance tomorrow 05/12/24 due to end of day.

## 2024-05-12 ENCOUNTER — Ambulatory Visit: Payer: Self-pay | Admitting: Neurology

## 2024-05-12 NOTE — Progress Notes (Signed)
 Prior Auth for MRI cervical and MRI Brain has been RESUBMITTED and APPROVED. Approval#:[id number]. Valid from: 05/12/2024-06/10/2024

## 2024-05-12 NOTE — Telephone Encounter (Signed)
 MRI cervical and MRI Brain has been approved. See documentation 04/13/2024

## 2024-05-13 DIAGNOSIS — G35D Multiple sclerosis, unspecified: Secondary | ICD-10-CM | POA: Diagnosis not present

## 2024-05-13 DIAGNOSIS — M47812 Spondylosis without myelopathy or radiculopathy, cervical region: Secondary | ICD-10-CM | POA: Diagnosis not present

## 2024-05-16 ENCOUNTER — Telehealth: Payer: Self-pay | Admitting: Neurology

## 2024-05-16 NOTE — Telephone Encounter (Signed)
 Please let pt know that MRI brain and cervical spine is stable, no new MS lesions are seen.  There has been some increased boney changes of the vertebra, but none of which is causing nerve impingement. Overall, everything looks good.  I will review further at her follow-up visit. Thank you.

## 2024-05-16 NOTE — Telephone Encounter (Signed)
Mychart message sent to patient with results.

## 2024-05-24 ENCOUNTER — Ambulatory Visit (INDEPENDENT_AMBULATORY_CARE_PROVIDER_SITE_OTHER): Admitting: Neurology

## 2024-05-24 ENCOUNTER — Encounter: Payer: Self-pay | Admitting: Neurology

## 2024-05-24 VITALS — BP 134/87 | HR 88 | Ht 63.85 in | Wt 152.0 lb

## 2024-05-24 DIAGNOSIS — G35A Relapsing-remitting multiple sclerosis: Secondary | ICD-10-CM | POA: Diagnosis not present

## 2024-05-24 NOTE — Patient Instructions (Signed)
 Check labs in 3 months  Follow-up in the office in 6 months

## 2024-05-24 NOTE — Progress Notes (Signed)
 Follow-up Visit   Date: 05/24/2024    Karla Howell MRN: [id number] DOB: [birth year]    Karla Howell is a 58 y.o. right-handed African American female with hypertension and RA returning to the clinic for follow-up of multiple sclerosis.  The patient was accompanied to the clinic by mother.  IMPRESSION: Relapsing remitting multiple sclerosis (dx 05/2022) with disease burden involving the subcortical white matter, cervical (C4-5) and thoracic (T2-3) cord.  NMO and antiMOG negative.  Surveillance imaging has been stable with no new lesions.  Imaging personally viewed from 05/2024 and is stable with no new lesions.  Labs are also within normal limits.  Clinically, she has ongoing right hand and leg/foot numbness.  - Dec 2023: hospitalized with progressive leg numbness and treated with IVMP  - Jan 2024:  started Tysabri   - June 2024:  JCV positive  - March 2025:  switched to Kesimpta   PLAN:  - Continue Kesimpta  injection every month - Check CBC, CMP, and IgG in 3 months - OK to take baclofen  10mg  as needed for spasms  Return to clinic in 6 months  --------------------------------------------- History of present illness: In July 2023, she was walking 1-2 miles and at the end of her walk, she began to have numbness and tingling involving the lower legs.  Symptoms lasted 5-10 minutes and self-resolved.  Her symptoms have become constant since October and she also has tightness in the legs.  In September, she began having right sided shoulder, neck, and arm pain. She was also having numbness into the right hand, especially the thumb and index finger. She has weakness in the right arm.  She is going to PT.     She saw Dr. Ibazebo at [facility] for these symptoms and specifically the right sided neck pain.  He ordered MRI cervical spine which showed T2 hyperintensity involving the cervical cord at C3-4 to C4-5.  Subsequent imaging of the thoracic cord  shows similar changes at T3 and T12.  MRI brain was normal.  She is here for further evaluation.    She denies cramps.  Earlier in 2023, she had 4-5 spells of left arm and leg drawing up.   In December 2023, she developed worsening leg heaviness, numbness, and imbalance.  She underwent MRI brain, cervical spine, and thoracic spine which showed new white matter lesions involving the brain as well as previously seen white matter changes at C4-5 and T2-3. Because of high likelihood of MS, she was started on solumedrol.  Since completing steroids, she feels that the right ankle is less stiff.  She was started on Tysabri  in January 2024 and switched to Kesimpta  in March 2025 due to JCV positivity.     UPDATE 10/27/2023:  She is here for follow-up visit.  She is no longer on Tysabri  and has started Kesimpta .  She is tolerating it well.   She continues to have numbness in the right hand (worse over the thumb and index finger) and foot.  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  No new neurological symptoms.    UPDATE 05/24/2024:  She is here for follow-up visit.   She reports having spells of bilateral leg achy pain and continues to have numbness in the right hand and foot.  No new numbness/tingling or weakness.   She is tolerating Kesimpta .  MRI brain, cervical spine, and thoracic spine is stable with no new lesions.    Medications:  Current Outpatient Medications on File Prior to  Visit  Medication Sig Dispense Refill   amLODipine  (NORVASC ) 2.5 MG tablet Take 1 tablet (2.5 mg total) by mouth daily. 90 tablet 3   baclofen  (LIORESAL ) 10 MG tablet TAKE 5-10MG  AT BEDTIME AS NEEDED 90 tablet 0   celecoxib (CELEBREX) 200 MG capsule Take 200 mg by mouth daily as needed for mild pain.     Cholecalciferol (VITAMIN D3) 125 MCG (5000 UT) CAPS Take 1 capsule by mouth daily in the afternoon.     Cyanocobalamin  (VITAMIN B-12 PO) Take 1 capsule by mouth daily.     Ofatumumab  (KESIMPTA ) 20 MG/0.4ML SOAJ Medication  Samples have been provided to the patient.  Drug name: Kesimpta        Strength: 20mg /0.33ml        Qty: 1  LOT: [id number]  Exp.Date: 12/06/2025  Dosing instructions:   The patient has been instructed regarding the correct time, dose, and frequency of taking this medication, including desired effects and most common side effects.   Karla Howell 4:29 PM 03/07/2024     vitamin C (ASCORBIC ACID) 500 MG tablet Take 500 mg by mouth daily. Reported on 10/02/2015     ZINC  GLUCONATE PO Take 1 tablet by mouth daily.     zolpidem  (AMBIEN  CR) 6.25 MG CR tablet TAKE 1 TABLET BY MOUTH AT BEDTIME AS NEEDED FOR SLEEP. 30 tablet 5   No current facility-administered medications on file prior to visit.    Allergies: No Known Allergies  Vital Signs:  BP 134/87   Pulse 88   Ht 5' 3.85 (1.622 m)   Wt 152 lb (68.9 kg)   LMP 01/06/2013   SpO2 98%   BMI 26.21 kg/m   Neurological Exam: MENTAL STATUS including orientation to time, place, person, recent and remote memory, attention span and concentration, language, and fund of knowledge is normal.  Speech is not dysarthric.  CRANIAL NERVES:   Pupils equal round and reactive to light.  Normal conjugate, extra-ocular eye movements in all directions of gaze.  No ptosis.  Face is symmetric.   MOTOR:  Motor strength is 5/5 in all extremities, except trace weakness of the right hand with finger abduction.  No atrophy, fasciculations or abnormal movements.  No pronator drift.  Tone is normal.    MSRs:  Reflexes are 2+/4 throughout, except 3+/4 bilateral patella.  No ankle clonus.  SENSORY:  Intact to vibration throughout.   COORDINATION/GAIT:  Normal finger-to- nose-finger.  Intact rapid alternating movements bilaterally. Gait is normal, unassisted and stable.    Data:  MRI thoracic spine wwo contrast 04/29/2024 at [facility]:  T2 hyperintensities consistent with demyelinating plaques at C7 and T2-3 unchanged in the prior exam.  No new lesions.    MRI  cervical spine wwo contrast 05/13/2024: No significant change in demyelinating lesions, the largest a plaque extending from C3-4 to C5-6, within the right cervical cord. Smaller C7 and T2 lesions are also noted. No evidence of enhancement to suggest active demyelination.  Increased C5-6 degenerative changes with new Schmorl's node in the C6 superior endplate. No significant central canal or neural foraminal stenosis.   MRI brain wwo contrast 05/13/2024: 1.  No acute intracranial abnormality.  2.  Minimal scattered T2/flair hyperintensities scattered throughout the bilateral cerebral hemispheres, nonspecific.  3.  Mildly low-lying cerebellar tonsils, nonspecific and could represent a mild Chiari I malformation.    NCS/EMG of the right arm 12/18/2023: This is a normal study of the right upper extremity.  In particular, there is no  evidence of a cervical radiculopathy or carpal tunnel syndrome.  MRI brain, cervical, and thoracic spine wwo contrast 05/11/2022: 1. New hyperintense T2-weighted signal lesions within both occipital lobes and within the cervical and thoracic spinal cord, consistent with demyelinating disease. CSF sampling should be considered. 2. New/worsening spinal cord lesions at C3-5 and T2-3 with associated contrast enhancement. 3. Unchanged C7 lesion.   MRI brain wwo contrast 05/22/2023:  Normal  MRI cervical and thoracic spine wwo contrast 05/22/2023: 1. Demyelinating lesion in the right side of the cord at C2-C5.  2. Demyelinating lesion in the right posterior aspect of the cord at C7.  3. Demyelinating lesion at T2-T3.   The lesions do not enhance. They were present on the prior study from 12/20/2022. They are less conspicuous than on that study.   Lab Results  Component Value Date   ALT 13 05/04/2024   AST 17 05/04/2024   ALKPHOS 61 03/14/2024   BILITOT 0.5 05/04/2024      Thank you for allowing me to participate in patient's care.  If I can answer any additional  questions, I would be pleased to do so.    Sincerely,    Sanah Kraska K. Tobie, DO

## 2024-06-17 ENCOUNTER — Other Ambulatory Visit: Payer: Self-pay | Admitting: Neurology

## 2024-07-06 ENCOUNTER — Other Ambulatory Visit: Payer: Self-pay | Admitting: Family Medicine

## 2024-09-06 ENCOUNTER — Ambulatory Visit: Admitting: Neurology

## 2024-11-29 ENCOUNTER — Ambulatory Visit: Admitting: Neurology
